# Patient Record
Sex: Female | Born: 1935 | Race: Black or African American | Hispanic: No | Smoking: Never smoker
Health system: Southern US, Community
[De-identification: ages and names within clinical notes are randomized; demographics above are authoritative.]

## PROBLEM LIST (undated history)

## (undated) DIAGNOSIS — I10 Essential (primary) hypertension: Secondary | ICD-10-CM

## (undated) DIAGNOSIS — N2581 Secondary hyperparathyroidism of renal origin: Secondary | ICD-10-CM

## (undated) DIAGNOSIS — D649 Anemia, unspecified: Secondary | ICD-10-CM

## (undated) DIAGNOSIS — K219 Gastro-esophageal reflux disease without esophagitis: Secondary | ICD-10-CM

## (undated) DIAGNOSIS — E785 Hyperlipidemia, unspecified: Secondary | ICD-10-CM

## (undated) DIAGNOSIS — S88911A Complete traumatic amputation of right lower leg, level unspecified, initial encounter: Secondary | ICD-10-CM

## (undated) DIAGNOSIS — N186 End stage renal disease: Secondary | ICD-10-CM

## (undated) DIAGNOSIS — R55 Syncope and collapse: Secondary | ICD-10-CM

## (undated) DIAGNOSIS — S88912A Complete traumatic amputation of left lower leg, level unspecified, initial encounter: Secondary | ICD-10-CM

## (undated) DIAGNOSIS — I739 Peripheral vascular disease, unspecified: Secondary | ICD-10-CM

---

## 2004-08-16 ENCOUNTER — Ambulatory Visit: Payer: Self-pay | Admitting: Family Medicine

## 2004-10-05 ENCOUNTER — Ambulatory Visit: Payer: Self-pay | Admitting: Family Medicine

## 2004-11-03 ENCOUNTER — Ambulatory Visit: Payer: Self-pay | Admitting: Family Medicine

## 2004-12-22 ENCOUNTER — Ambulatory Visit: Payer: Self-pay | Admitting: Family Medicine

## 2005-02-14 ENCOUNTER — Ambulatory Visit: Payer: Self-pay | Admitting: Family Medicine

## 2005-05-19 ENCOUNTER — Ambulatory Visit: Payer: Self-pay | Admitting: Family Medicine

## 2005-06-29 ENCOUNTER — Ambulatory Visit: Payer: Self-pay | Admitting: Family Medicine

## 2005-08-31 ENCOUNTER — Ambulatory Visit: Payer: Self-pay | Admitting: Family Medicine

## 2005-11-04 ENCOUNTER — Ambulatory Visit: Payer: Self-pay | Admitting: Family Medicine

## 2005-12-28 ENCOUNTER — Ambulatory Visit: Payer: Self-pay | Admitting: Family Medicine

## 2006-03-01 ENCOUNTER — Ambulatory Visit: Payer: Self-pay | Admitting: Family Medicine

## 2006-03-16 ENCOUNTER — Ambulatory Visit: Payer: Self-pay | Admitting: Family Medicine

## 2006-06-27 ENCOUNTER — Ambulatory Visit: Payer: Self-pay | Admitting: Family Medicine

## 2006-08-29 ENCOUNTER — Ambulatory Visit: Payer: Self-pay | Admitting: Family Medicine

## 2006-12-13 ENCOUNTER — Ambulatory Visit: Payer: Self-pay | Admitting: Family Medicine

## 2007-01-09 ENCOUNTER — Ambulatory Visit: Payer: Self-pay | Admitting: Vascular Surgery

## 2007-01-11 ENCOUNTER — Ambulatory Visit: Payer: Self-pay | Admitting: Vascular Surgery

## 2007-01-11 ENCOUNTER — Ambulatory Visit (HOSPITAL_COMMUNITY): Admission: RE | Admit: 2007-01-11 | Discharge: 2007-01-11 | Payer: Self-pay | Admitting: Vascular Surgery

## 2010-04-06 ENCOUNTER — Ambulatory Visit: Payer: Self-pay | Admitting: Vascular Surgery

## 2010-04-12 ENCOUNTER — Ambulatory Visit: Payer: Self-pay | Admitting: Vascular Surgery

## 2010-04-12 ENCOUNTER — Ambulatory Visit (HOSPITAL_COMMUNITY)
Admission: RE | Admit: 2010-04-12 | Discharge: 2010-04-12 | Payer: Self-pay | Source: Home / Self Care | Admitting: Vascular Surgery

## 2010-04-20 ENCOUNTER — Ambulatory Visit: Payer: Self-pay | Admitting: Vascular Surgery

## 2010-05-07 ENCOUNTER — Ambulatory Visit: Payer: Self-pay | Admitting: Vascular Surgery

## 2010-05-20 ENCOUNTER — Ambulatory Visit: Payer: Self-pay | Admitting: Cardiology

## 2010-05-28 ENCOUNTER — Ambulatory Visit: Payer: Self-pay | Admitting: Vascular Surgery

## 2010-07-05 ENCOUNTER — Ambulatory Visit (HOSPITAL_COMMUNITY)
Admission: RE | Admit: 2010-07-05 | Discharge: 2010-07-05 | Payer: Self-pay | Source: Home / Self Care | Attending: Nephrology | Admitting: Nephrology

## 2010-08-06 ENCOUNTER — Ambulatory Visit (HOSPITAL_COMMUNITY)
Admission: RE | Admit: 2010-08-06 | Discharge: 2010-08-06 | Payer: Self-pay | Source: Home / Self Care | Attending: Nephrology | Admitting: Nephrology

## 2010-08-09 LAB — POTASSIUM: Potassium: 4.2 mEq/L (ref 3.5–5.1)

## 2010-09-30 LAB — GLUCOSE, CAPILLARY
Glucose-Capillary: 83 mg/dL (ref 70–99)
Glucose-Capillary: 88 mg/dL (ref 70–99)
Glucose-Capillary: 97 mg/dL (ref 70–99)

## 2010-09-30 LAB — POCT I-STAT 4, (NA,K, GLUC, HGB,HCT)
Glucose, Bld: 86 mg/dL (ref 70–99)
Hemoglobin: 12.2 g/dL (ref 12.0–15.0)

## 2010-09-30 LAB — SURGICAL PCR SCREEN: Staphylococcus aureus: NEGATIVE

## 2010-11-30 NOTE — Consult Note (Signed)
VASCULAR SURGERY CONSULTATION   Linda [name], Linda [name]  DOB:  09/22/1930                                       01/09/2007  CHART#:[id number]   HISTORY:  Linda [name] is a 77 year old female patient, referred by Dr.  Kathaleen Maser. Fleishman because of gangrene of the left fifth toe.  She had a  history of a right below-the-knee amputation performed in [place],  [place] in 2001, secondary to gangrene of the right foot.  She  apparently had an angiogram which did not reveal any reconstructable  situation in the right leg prior to that.  At some point in the last few  months she developed a gangrenous ulcer on her left fifth toe which has  been treated with antibiotics and local wound care, with no improvement.  She had arterial Dopplers performed which revealed excellent flow down  through the popliteal level and actually biphasic wave forms in the left  posterior tibial and dorsalis pedis arteries, but toe brachial index of  only 0.23, suggesting severe disease in the foot.  She was referred to  see if revascularization could improve the chance of feeling, since she  has had one amputation.  She ambulates with a walker with her right  below-the-knee amputation prosthesis.   PAST MEDICAL HISTORY:  1. Non-insulin-dependent diabetes mellitus.  2. Hypertension.  3. Hyperlipidemia.  4. Negative coronary artery disease, chronic obstructive pulmonary      disease or stroke.   PAST SURGICAL HISTORY:  1. A right below-the-knee amputation.  2. Hysterectomy.  3. Bilateral cataract surgeries.   FAMILY HISTORY:  Negative for stroke and coronary artery disease.  Positive for diabetes in her father, she thinks.   SOCIAL HISTORY:  She is single.  She has four children.  She is retired.  She does not use tobacco or alcohol.   REVIEW OF SYSTEMS/MEDICATIONS:  Please see the health and history form.   ALLERGIES:  PERCOCET.   PHYSICAL EXAMINATION:  VITAL SIGNS:  Blood  pressure 160/80, heart rate  92, respirations 18.  GENERAL:  She is an elderly female, in no apparent distress, alert and  oriented x3.  NECK:  Supple with 3+ carotid pulses palpable.  No bruits are audible.  There is no palpable adenopathy in the neck.  NEUROLOGIC:  Exam is normal, with the exception of decreased sensation  in  the left foot.  CHEST:  Clear to auscultation.  CARDIOVASCULAR:  A regular rhythm with no murmurs.  ABDOMEN:  Soft, nontender, with no palpable masses.  EXTREMITIES:  Right leg has a below-the-knee amputation prosthesis in  place with 3+ femoral pulse palpable.  Left leg has a 3+ femoral and  popliteal pulse palpable.  Dorsalis pedis and posterior tibial pulses  are not palpable on the left.  She has gangrene of the left fifth toe  with no cellulitis or purulent drainage.  The other toes appear healthy.   I suspect that she has severe  distal tibial disease and cannot be  helped by revascularization, but we will perform a left femoral  angiogram on Thursday, January 01, 2007, to see what is available for  possible revascularization.  I suspect she will eventually require a  left below-the-knee amputation.   Quita Skye Hart Rochester, M.D.  Electronically Signed  JDL/MEDQ  D:  01/09/2007  T:  01/10/2007  Job:  [id number]  cc:   Sherilyn Cooter A. Cleotis Nipper, M.D.  Denny Peon. Ulice Brilliant, D.P.M.  Delaney Meigs, M.D.

## 2010-11-30 NOTE — Procedures (Signed)
CEPHALIC VEIN MAPPING   INDICATION:  End-stage renal disease, evaluate for dialysis placement.   HISTORY:   EXAM:  The right cephalic vein is not compressible.   The right basilic vein is compressible.   The left cephalic vein is compressible.   The left basilic vein is compressible.   See attached worksheet for all measurements.   IMPRESSION:  1. Patent left cephalic vein and patent right and left basilic veins.  2. Right cephalic vein shows a segment of occlusive chronic thrombus      at the distal upper arm and antecubital fossa.   ___________________________________________  Quita Skye. Hart Rochester, M.D.   EM/MEDQ  D:  04/06/2010  T:  04/06/2010  Job:  [id number] [id number]

## 2010-11-30 NOTE — Assessment & Plan Note (Signed)
OFFICE VISIT   Linda [name], Linda [name]  DOB:  1931-05-26                                       04/20/2010  CHART#:[id number]   The patient presents today for evaluation of recently placed right upper  arm AV Gore-Tex graft and left IJ Diatek catheter by Dr. Imogene Burn on  04/12/2010.  The nursing facility called saying she was having increased  pain and redness in the graft site.   On physical exam she has an excellent thrill in the graft.  She has the  usual amount of erythema one week following placement of a new graft.  She has diminished pulses in both hands.  She does report some coolness  in her right hand.  She is left-handed.  She denies any significant pain  in her right hand but does have pain in her right upper arm over the  insertion site.  The incision sites themselves are healing quite nicely  and there is no evidence of infection.  She was reassured and will see  Dr. Imogene Burn for continued follow-up to rule out a significant steal in 2  weeks.     Larina Earthly, M.D.  Electronically Signed   TFE/MEDQ  D:  04/20/2010  T:  04/21/2010  Job:  [id number]

## 2010-11-30 NOTE — Op Note (Signed)
NAME:  Linda [name], Linda [name] NO.:  [id number]   MEDICAL RECORD NO.:  [id number]          PATIENT TYPE:  AMB   LOCATION:  SDS                          FACILITY:  [facility]   PHYSICIAN:  Quita Skye. Hart Rochester, M.D.  DATE OF BIRTH:  July 10, 1931   DATE OF PROCEDURE:  01/11/2007  DATE OF DISCHARGE:                               OPERATIVE REPORT   PREOPERATIVE DIAGNOSIS:  Gangrenous left fifth toe secondary to severe  tibial occlusive disease.   PROCEDURE:  Left femoral angiogram.   SURGEON:  Dr. Hart Rochester   ANESTHESIA:  Local Xylocaine.   CONTRAST:  60 mL.   COMPLICATIONS:  None.   DESCRIPTION OF PROCEDURE:  The patient was taken to [facility]  peripheral endovascular lab, placed in supine position at which time the  left groin was prepped with Betadine solution and draped in a routine  sterile manner.  After infiltration of 1% Xylocaine, left common femoral  artery was entered percutaneously.  Guidewire passed into the external  iliac artery under fluoroscopic guidance.  A 5-French sheath and dilator  were passed over the guide wire, dilator removed and a left lower  extremity angiogram was performed through the sheath injecting 40 mL of  contrast at 30 mL per second.  This revealed the left external iliac,  common femoral, profunda femoral, superficial femoral and popliteal  arteries to be relatively normal and widely patent.  There was severe  tibial disease below the knee with anterior tibial being totally  occluded, posterior tibial being totally occluded, peroneal being patent  but severely diseased.  It was patent down to the lower third of the leg  where it became occluded.  The only reconstituted vessel in the foot was  a dorsalis pedis artery just below the flexion crease, but there was  very poor filling of any palmar pedal arch, and this segment of vessel  appeared to be diseased.  Additional views of the foot were obtained  using the peak hold technique.  Sheath was  then removed.  Adequate  compression applied.  No complications ensued.   FINDINGS:  Widely patent external iliac, superficial femoral, common  femoral, profunda femoris and popliteal arteries on the left with severe  tibial occlusive disease with the only patent vessel being the peroneal  artery which was severely diseased and reconstitution of a diseased  dorsalis pedis artery in the foot.      Quita Skye Hart Rochester, M.D.  Electronically Signed    JDL/MEDQ  D:  01/11/2007  T:  01/11/2007  Job:  [id number]   cc:   Sherilyn Cooter A. Cleotis Nipper, M.D.

## 2010-11-30 NOTE — Consult Note (Signed)
NEW PATIENT CONSULTATION   Linda [name], Linda [name]  DOB:  [birth year]                                       04/06/2010  CHART#:[id number]   The patient is a 77 year old female with chronic renal insufficiency who  has never had hemodialysis, but was referred by Dr. Kristian Covey for  vascular access.  She is left-handed.  Her renal insufficiency is due to  insulin dependent diabetes mellitus.   PAST MEDICAL HISTORY:  Chronic medical problems:  1. Hypertension.  2. Hyperlipidemia.  3. Diabetes mellitus insulin dependent.  4. End-stage renal disease.  5. GERD.  6. Depression.  7. Peripheral vascular disease status post bilateral below-knee      amputation 10-20 years ago.  8. Negative for coronary artery disease, COPD or stroke.   SOCIAL HISTORY:  She is single, has three children and does not use  tobacco.  Does drink occasional alcohol.   FAMILY HISTORY:  Negative for coronary artery disease, diabetes and  stroke.   REVIEW OF SYSTEMS:  Positive for arthritis, tremor, chronic renal  insufficiency and occasional esophagitis.  All other systems are  negative in review of systems.   PHYSICAL EXAMINATION:  His blood pressure 207/79, heart rate 89,  respirations 14.  General:  She is chronically ill-appearing female who  is in no distress.  She is alert and oriented x3.  HEENT:  Exam normal  for age.  EOMs intact.  Lungs:  Clear to auscultation.  No rhonchi or  wheezing.  Cardiovascular:  Regular rhythm.  No murmurs.  Carotid pulses  3+, no bruits.  Abdomen is obese.  No palpable masses.  Musculoskeletal:  Exam is free of major deformities.  She does have bilateral below-knee  amputations.  Neurologic exam unremarkable with intact sensation.  Skin:  Free of rashes.  Upper extremity exam reveals 3+ brachial and radial  pulses bilaterally.  She has well-perfused upper extremities, the  surface veins appear poor on physical exam.   Today I ordered a bilateral vein  mapping which I have reviewed and  interpreted.  She has cephalic veins which are totally inadequate in  both upper extremities.  The right basilic vein also appears poor.  The  left basilic vein is slightly larger in the mid to upper arm, but not  imaged in the antecubital area.   I think this patient will need an AV graft in her nondominant arm  (right).  Dr. Kristian Covey would also like a Diatek catheter.  I have  scheduled that for Dr. Imogene Burn on Monday, September 26 to have a Diatek  catheter in the graft inserted in the right upper extremity.  Risks and  benefits have been thoroughly discussed with the patient and she would  like to proceed.     Quita Skye Hart Rochester, M.D.  Electronically Signed   JDL/MEDQ  D:  04/06/2010  T:  04/07/2010  Job:  [id number]   cc:   Delaney Meigs, M.D.  Jorja Loa, M.D.

## 2010-11-30 NOTE — Assessment & Plan Note (Signed)
OFFICE VISIT   TASHICA, PROVENCIO  DOB:  15-Oct-1930                                       05/07/2010  CHART#:[id number]   This is a postoperative visit.   HISTORY OF PRESENT ILLNESS:  This is a 77 year old female who underwent,  on the April 12, 2010, a left internal jugular tunneled dialysis  catheter and then placement of a right brachial axillary arteriovenous  graft.  She previously, since her operation, has been seen in clinic by  Dr. Arbie Cookey on October 4.  At that point she was healing her incisions  well, with coolness in the right hand and some pain at the insertion  site.  He felt that at this point she was progressing appropriately.  Today she complains of coolness in the hand, some tingling in the  incision site, and unwillingness to move her upper extremity due to this  discomfort and  some numbness in her finger tips.  All of her symptoms  have been improving consistently since the operation.  At this point she  had does not note the pain is significant enough to wake her from sleep,  and is no longer taking pain medications.   Past medical history, past surgical history, social history, family  history, review of systems, notification and allergies are all the same  as her previous clinic visit from April 06, 2010.   PHYSICAL EXAMINATION:  Vital signs:  Temperature 97.9, blood pressure  158/80, heart rate of 74, respirations were 12.  On focused exam of her  right arm, she is healing her right upper arm incision nicely.  The  Dermabond is slowly peeling away.  Incisions are intact.  Axillary  incision is also healing well.  No signs of erythema throughout this  graft tunnel.  Excellent thrill and bruit throughout this graft.  There  is a weakly palpable radial pulse which I verified with a Doppler which  has a biphasic radial signal.  She has intact pinpoint discrimination in  the palm and the fingertips.  The hand bilaterally is a  little bit cool.  She has intact hand grip 4/5 strength and does have intrinsic and  extrinsic muscle strength intact, somewhat extenuated at 3/5 strength.  When I ask her to fully flex the upper extremities, she is able to  produce motor to fully extend and flex her upper extremity, but she  notes it does cause her some discomfort.   MEDICAL DECISION MAKING:  This is a patient now status post a right  upper arm arteriovenous graft placement that has some evidence of  physiologic steal; however, she has biphasic signals in her radial  artery with intact motor and sensation.  I see no advantage to ligating  this access.  Additionally, her symptomatology is improving with time so  once again I recommended strongly to her that we do not ligate this and  we just watch it with time.  She is now about 4 weeks out from the  access so another 2 weeks we will see her back in clinic and probably at  that point clear her for starting cannulation of this graft.  She is  going to keep an eye on her hand symptoms and we will see her at that  point.     Leonides Sake, MD  Electronically Signed   BC/MEDQ  D:  05/07/2010  T:  05/07/2010  Job:  [id number]

## 2010-11-30 NOTE — Assessment & Plan Note (Signed)
OFFICE VISIT   YARENIS, CERINO  DOB:  09-26-1930                                       05/28/2010  CHART#:[id number]   HISTORY OF PRESENT ILLNESS:  This is a 77 year old female that underwent  a left internal jugular tunneled dialysis catheter and a right brachial  axillary AV graft on April 12, 2010.  I saw her again on October 21  and also had been seen by Dr. Arbie Cookey October 4.  She had some steal  symptoms during both visits but this has been improving.  At this point,  her symptoms are mostly resolved and she is able to use her right hand  and the numbness is greatly improved.   PHYSICAL EXAMINATION:  Today she a temperature of 97.8 blood pressure  160/78, heart rate 74, respirations were 12.  On examination, she had  hand grip in the right arm that was 4/5 and sensation intact in all  fingers in this hand.  She did not have a palpable radial pulse, so I  was able to Doppler it.  There was a biphasic signal here.  The hand is  a little cool.  There is a strong thrill and bruit in her graft.   MEDICAL DECISION MAKING:  At this point, she is greater than 6 weeks out  from surgery so I would at this point clear her for graft use.  This is  a 77 year old female status post right arm arteriovenous graft placed in  a brachioaxillary configuration.  At this point, she is ready to start  cannulation of this graft.  Her steal symptoms have improved since  placement of this graft.  I have cleared her for use of the graft and  after 2 successful cannulations, they will remove the left internal  jugular TDC.  She can follow up with me as needed.     Leonides Sake, MD  Electronically Signed   BC/MEDQ  D:  05/28/2010  T:  05/31/2010  Job:  [id number]

## 2011-03-28 ENCOUNTER — Other Ambulatory Visit (HOSPITAL_COMMUNITY): Payer: Self-pay | Admitting: Nephrology

## 2011-03-28 DIAGNOSIS — N186 End stage renal disease: Secondary | ICD-10-CM

## 2011-03-28 DIAGNOSIS — Z992 Dependence on renal dialysis: Secondary | ICD-10-CM

## 2011-03-29 ENCOUNTER — Other Ambulatory Visit (HOSPITAL_COMMUNITY): Payer: Self-pay | Admitting: Nephrology

## 2011-03-29 DIAGNOSIS — Z992 Dependence on renal dialysis: Secondary | ICD-10-CM

## 2011-03-29 DIAGNOSIS — N186 End stage renal disease: Secondary | ICD-10-CM

## 2011-03-30 ENCOUNTER — Other Ambulatory Visit (HOSPITAL_COMMUNITY): Payer: Self-pay

## 2011-03-30 ENCOUNTER — Ambulatory Visit (HOSPITAL_COMMUNITY)
Admission: RE | Admit: 2011-03-30 | Discharge: 2011-03-30 | Disposition: A | Discharge status: Home / Self Care | Payer: Medicare Other | Source: Ambulatory Visit | Attending: Nephrology | Admitting: Nephrology

## 2011-03-30 DIAGNOSIS — T82898A Other specified complication of vascular prosthetic devices, implants and grafts, initial encounter: Secondary | ICD-10-CM | POA: Insufficient documentation

## 2011-03-30 DIAGNOSIS — N186 End stage renal disease: Secondary | ICD-10-CM

## 2011-03-30 DIAGNOSIS — Y832 Surgical operation with anastomosis, bypass or graft as the cause of abnormal reaction of the patient, or of later complication, without mention of misadventure at the time of the procedure: Secondary | ICD-10-CM | POA: Insufficient documentation

## 2011-03-30 MED ORDER — IOHEXOL 300 MG/ML  SOLN
100.0000 mL | Freq: Once | INTRAMUSCULAR | Status: AC | PRN
  Administered 2011-03-30: 27 mL via INTRAVENOUS

## 2011-04-29 ENCOUNTER — Other Ambulatory Visit (HOSPITAL_COMMUNITY): Payer: Self-pay | Admitting: Nephrology

## 2011-04-29 DIAGNOSIS — N186 End stage renal disease: Secondary | ICD-10-CM

## 2011-05-04 LAB — PROTIME-INR
INR: 1
Prothrombin Time: 12.9

## 2011-05-04 LAB — COMPREHENSIVE METABOLIC PANEL
Alkaline Phosphatase: 102
BUN: 27 — ABNORMAL HIGH
Calcium: 9.1
Chloride: 106
Glucose, Bld: 250 — ABNORMAL HIGH
Potassium: 4.7
Total Bilirubin: 0.4

## 2011-05-04 LAB — CBC: RBC: 4.14

## 2011-05-04 LAB — APTT: aPTT: 30

## 2011-05-17 ENCOUNTER — Ambulatory Visit (HOSPITAL_COMMUNITY): Admission: RE | Admit: 2011-05-17 | Payer: Medicare Other | Source: Ambulatory Visit

## 2011-05-17 ENCOUNTER — Inpatient Hospital Stay (HOSPITAL_COMMUNITY): Admission: RE | Admit: 2011-05-17 | Payer: Medicare Other | Source: Ambulatory Visit

## 2011-05-19 ENCOUNTER — Ambulatory Visit (HOSPITAL_COMMUNITY)
Admission: RE | Admit: 2011-05-19 | Discharge: 2011-05-19 | Disposition: A | Discharge status: Home / Self Care | Payer: Medicare Other | Source: Ambulatory Visit | Attending: Nephrology | Admitting: Nephrology

## 2011-05-19 DIAGNOSIS — Y832 Surgical operation with anastomosis, bypass or graft as the cause of abnormal reaction of the patient, or of later complication, without mention of misadventure at the time of the procedure: Secondary | ICD-10-CM | POA: Insufficient documentation

## 2011-05-19 DIAGNOSIS — N186 End stage renal disease: Secondary | ICD-10-CM | POA: Insufficient documentation

## 2011-05-19 DIAGNOSIS — T82898A Other specified complication of vascular prosthetic devices, implants and grafts, initial encounter: Secondary | ICD-10-CM | POA: Insufficient documentation

## 2011-05-19 MED ORDER — IOHEXOL 300 MG/ML  SOLN
27.0000 mL | Freq: Once | INTRAMUSCULAR | Status: AC | PRN
  Administered 2011-05-19: 27 mL via INTRAVENOUS

## 2011-08-01 ENCOUNTER — Encounter (HOSPITAL_COMMUNITY): Payer: Self-pay | Admitting: Pharmacy Technician

## 2011-08-01 ENCOUNTER — Other Ambulatory Visit (HOSPITAL_COMMUNITY): Payer: Self-pay | Admitting: Nephrology

## 2011-08-01 DIAGNOSIS — N186 End stage renal disease: Secondary | ICD-10-CM

## 2011-08-02 ENCOUNTER — Ambulatory Visit (HOSPITAL_COMMUNITY)
Admission: RE | Admit: 2011-08-02 | Discharge: 2011-08-02 | Disposition: A | Discharge status: Home / Self Care | Payer: Medicare Other | Source: Ambulatory Visit | Attending: Nephrology | Admitting: Nephrology

## 2011-08-02 ENCOUNTER — Encounter (HOSPITAL_COMMUNITY): Payer: Self-pay

## 2011-08-02 ENCOUNTER — Other Ambulatory Visit (HOSPITAL_COMMUNITY): Payer: Self-pay | Admitting: Nephrology

## 2011-08-02 VITALS — BP 121/63 | HR 68 | Temp 97.8°F | Resp 15

## 2011-08-02 DIAGNOSIS — E785 Hyperlipidemia, unspecified: Secondary | ICD-10-CM | POA: Insufficient documentation

## 2011-08-02 DIAGNOSIS — S88919A Complete traumatic amputation of unspecified lower leg, level unspecified, initial encounter: Secondary | ICD-10-CM | POA: Insufficient documentation

## 2011-08-02 DIAGNOSIS — N186 End stage renal disease: Secondary | ICD-10-CM

## 2011-08-02 DIAGNOSIS — E119 Type 2 diabetes mellitus without complications: Secondary | ICD-10-CM | POA: Insufficient documentation

## 2011-08-02 DIAGNOSIS — I120 Hypertensive chronic kidney disease with stage 5 chronic kidney disease or end stage renal disease: Secondary | ICD-10-CM | POA: Insufficient documentation

## 2011-08-02 DIAGNOSIS — Y832 Surgical operation with anastomosis, bypass or graft as the cause of abnormal reaction of the patient, or of later complication, without mention of misadventure at the time of the procedure: Secondary | ICD-10-CM | POA: Insufficient documentation

## 2011-08-02 DIAGNOSIS — T82898A Other specified complication of vascular prosthetic devices, implants and grafts, initial encounter: Secondary | ICD-10-CM | POA: Insufficient documentation

## 2011-08-02 DIAGNOSIS — D649 Anemia, unspecified: Secondary | ICD-10-CM | POA: Insufficient documentation

## 2011-08-02 HISTORY — DX: End stage renal disease: N18.6

## 2011-08-02 HISTORY — DX: Hyperlipidemia, unspecified: E78.5

## 2011-08-02 HISTORY — DX: Anemia, unspecified: D64.9

## 2011-08-02 HISTORY — DX: Essential (primary) hypertension: I10

## 2011-08-02 HISTORY — DX: Complete traumatic amputation of left lower leg, level unspecified, initial encounter: S88.912A

## 2011-08-02 HISTORY — DX: Complete traumatic amputation of right lower leg, level unspecified, initial encounter: S88.911A

## 2011-08-02 HISTORY — DX: Syncope and collapse: R55

## 2011-08-02 LAB — POTASSIUM: Potassium: 4.9 mEq/L (ref 3.5–5.1)

## 2011-08-02 MED ORDER — MIDAZOLAM HCL 5 MG/5ML IJ SOLN
INTRAMUSCULAR | Status: AC | PRN
  Administered 2011-08-02: 1 mg via INTRAVENOUS

## 2011-08-02 MED ORDER — IOHEXOL 300 MG/ML  SOLN
100.0000 mL | Freq: Once | INTRAMUSCULAR | Status: AC | PRN
  Administered 2011-08-02: 50 mL via INTRAVENOUS

## 2011-08-02 MED ORDER — MIDAZOLAM HCL 2 MG/2ML IJ SOLN
INTRAMUSCULAR | Status: AC
  Filled 2011-08-02: qty 4

## 2011-08-02 MED ORDER — FENTANYL CITRATE 0.05 MG/ML IJ SOLN
INTRAMUSCULAR | Status: AC | PRN
  Administered 2011-08-02: 50 ug via INTRAVENOUS

## 2011-08-02 MED ORDER — FENTANYL CITRATE 0.05 MG/ML IJ SOLN
INTRAMUSCULAR | Status: AC
  Filled 2011-08-02: qty 4

## 2011-08-02 MED ORDER — ALTEPLASE 100 MG IV SOLR
2.0000 mg | INTRAVENOUS | Status: DC
  Filled 2011-08-02: qty 2

## 2011-08-02 MED ORDER — HEPARIN SODIUM (PORCINE) 1000 UNIT/ML IJ SOLN
INTRAMUSCULAR | Status: AC
  Administered 2011-08-02: 3000 [IU]
  Filled 2011-08-02: qty 1

## 2011-08-02 NOTE — H&P (Signed)
Linda [name] is an 78 y.o. female.   Chief Complaint: clotted dialysis graft HPI: Patient with ESRD and clotted right arm AVGG presents today for thrombolysis, possible angioplasty/stenting of AVGG or placement of new catheter if necessary.  Past Medical History  Diagnosis Date  . Syncope   . End stage kidney disease   . Hyperlipidemia   . Anemia   . Hypertension   . 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 Diabetes mellitus    PSH: vascular access procedures, bilat LE amputations  No family history on file. Social History:  does not have a smoking history on file. She does not have any smokeless tobacco history on file. Her alcohol and drug histories not on file.  Allergies:  Allergies  Allergen Reactions  . Percocet (Oxycodone-Acetaminophen)     Medications Prior to Admission  Medication Sig Dispense Refill  . Amino Acids-Protein Hydrolys (PRO-STAT) LIQD Take 30 mLs by mouth 3 (three) times daily. Wild cherry      . aspirin EC 81 MG tablet Take 81 mg by mouth daily.      . cloNIDine (CATAPRES) 0.2 MG tablet Take 0.2 mg by mouth 2 (two) times daily.      . Cranberry 400 MG TABS Take 2 tablets by mouth 2 (two) times daily.      Marland Kitchen diltiazem (CARDIZEM CD) 300 MG 24 hr capsule Take 300 mg by mouth daily.      . enalapril (VASOTEC) 10 MG tablet Take 10 mg by mouth daily.      Marland Kitchen Fe-Succ-C-Thre-B12-Des Stomach (CHROMAGEN PO) Take 1 capsule by mouth daily.      . insulin detemir (LEVEMIR) 100 UNIT/ML injection Inject 10 Units into the skin at bedtime.      . insulin lispro (HUMALOG) 100 UNIT/ML injection Inject 5 Units into the skin 2 (two) times daily before lunch and supper.      . potassium chloride (K-DUR,KLOR-CON) 10 MEQ tablet Take 40 mEq by mouth daily.      . ranitidine (ZANTAC) 75 MG tablet Take 75 mg by mouth 2 (two) times daily.      . simvastatin (ZOCOR) 10 MG tablet Take 10 mg by mouth at bedtime.       Medications Prior to  Admission  Medication Dose Route Frequency Provider Last Rate Last Dose  . alteplase (ACTIVASE) injection 2 mg  2 mg Intracatheter to XRAY Abundio Miu, MD        Results for orders placed during the hospital encounter of 08/02/11 (from the past 48 hour(s))  GLUCOSE, CAPILLARY     Status: Abnormal   Collection Time   08/02/11  8:12 AM      Component Value Range Comment   Glucose-Capillary 102 (*) 70 - 99 (mg/dL)    Results for orders placed during the hospital encounter of 08/02/11  GLUCOSE, CAPILLARY      Component Value Range   Glucose-Capillary 102 (*) 70 - 99 (mg/dL)    Review of Systems  Constitutional: Negative for fever and chills.  Respiratory: Negative for cough and shortness of breath.   Cardiovascular: Negative for chest pain.  Gastrointestinal: Negative for nausea, vomiting and abdominal pain.  Neurological: Negative for headaches.  Endo/Heme/Allergies: Does not bruise/bleed easily.   Vitals: BP 196/60  HR 69  TEMP 97.8  O2 SATS 99% RA Physical Exam  Constitutional: She appears well-developed and well-nourished.  Cardiovascular: Normal rate and regular rhythm.  Right arm AVGG with no thrill/bruit  Respiratory: Effort normal and breath sounds normal.  GI: Soft. Bowel sounds are normal.     Assessment/Plan Patient with ESRD and clotted right arm AVGG; plan is for thrombolysis, possible angioplasty/stenting of graft with placement of new dialysis catheter if necessary.  Clydell Alberts,D KEVIN 08/02/2011, 9:02 AM

## 2011-08-02 NOTE — Procedures (Signed)
Successful declot of right upper graft.  No immediate complication.

## 2011-08-03 NOTE — H&P (Signed)
Agree with PA note. 

## 2011-08-15 ENCOUNTER — Encounter (HOSPITAL_COMMUNITY): Payer: Self-pay | Admitting: Emergency Medicine

## 2011-08-15 ENCOUNTER — Emergency Department (HOSPITAL_COMMUNITY): Payer: Medicare Other

## 2011-08-15 ENCOUNTER — Other Ambulatory Visit: Payer: Self-pay

## 2011-08-15 ENCOUNTER — Emergency Department (HOSPITAL_COMMUNITY)
Admission: EM | Admit: 2011-08-15 | Discharge: 2011-08-16 | Disposition: A | Discharge status: Home / Self Care | Payer: Medicare Other | Attending: Emergency Medicine | Admitting: Emergency Medicine

## 2011-08-15 DIAGNOSIS — Z794 Long term (current) use of insulin: Secondary | ICD-10-CM | POA: Insufficient documentation

## 2011-08-15 DIAGNOSIS — R4182 Altered mental status, unspecified: Secondary | ICD-10-CM | POA: Insufficient documentation

## 2011-08-15 DIAGNOSIS — R05 Cough: Secondary | ICD-10-CM | POA: Insufficient documentation

## 2011-08-15 DIAGNOSIS — I120 Hypertensive chronic kidney disease with stage 5 chronic kidney disease or end stage renal disease: Secondary | ICD-10-CM | POA: Insufficient documentation

## 2011-08-15 DIAGNOSIS — R0602 Shortness of breath: Secondary | ICD-10-CM | POA: Insufficient documentation

## 2011-08-15 DIAGNOSIS — Z992 Dependence on renal dialysis: Secondary | ICD-10-CM | POA: Insufficient documentation

## 2011-08-15 DIAGNOSIS — N390 Urinary tract infection, site not specified: Secondary | ICD-10-CM | POA: Insufficient documentation

## 2011-08-15 DIAGNOSIS — R059 Cough, unspecified: Secondary | ICD-10-CM | POA: Insufficient documentation

## 2011-08-15 DIAGNOSIS — E119 Type 2 diabetes mellitus without complications: Secondary | ICD-10-CM | POA: Insufficient documentation

## 2011-08-15 DIAGNOSIS — N186 End stage renal disease: Secondary | ICD-10-CM | POA: Insufficient documentation

## 2011-08-15 DIAGNOSIS — S88119A Complete traumatic amputation at level between knee and ankle, unspecified lower leg, initial encounter: Secondary | ICD-10-CM | POA: Insufficient documentation

## 2011-08-15 LAB — COMPREHENSIVE METABOLIC PANEL
ALT: 7 U/L (ref 0–35)
AST: 12 U/L (ref 0–37)
Alkaline Phosphatase: 106 U/L (ref 39–117)
CO2: 34 mEq/L — ABNORMAL HIGH (ref 19–32)
Chloride: 97 mEq/L (ref 96–112)
Creatinine, Ser: 3.86 mg/dL — ABNORMAL HIGH (ref 0.50–1.10)
GFR calc non Af Amer: 10 mL/min — ABNORMAL LOW (ref >90)
Total Bilirubin: 0.2 mg/dL — ABNORMAL LOW (ref 0.3–1.2)

## 2011-08-15 LAB — URINE MICROSCOPIC-ADD ON

## 2011-08-15 LAB — URINALYSIS, ROUTINE W REFLEX MICROSCOPIC
Bilirubin Urine: NEGATIVE
Ketones, ur: NEGATIVE mg/dL
Protein, ur: >300 mg/dL — AB
Urobilinogen, UA: 0.2 mg/dL (ref 0.0–1.0)

## 2011-08-15 LAB — DIFFERENTIAL
Basophils Absolute: 0 10*3/uL (ref 0.0–0.1)
Lymphocytes Relative: 35 % (ref 12–46)
Monocytes Absolute: 0.3 10*3/uL (ref 0.1–1.0)
Neutro Abs: 3.4 10*3/uL (ref 1.7–7.7)

## 2011-08-15 LAB — CBC
HCT: 31.4 % — ABNORMAL LOW (ref 36.0–46.0)
Hemoglobin: 9.7 g/dL — ABNORMAL LOW (ref 12.0–15.0)
RDW: 14.7 % (ref 11.5–15.5)
WBC: 5.9 10*3/uL (ref 4.0–10.5)

## 2011-08-15 LAB — CARDIAC PANEL(CRET KIN+CKTOT+MB+TROPI)
Relative Index: INVALID (ref 0.0–2.5)
Troponin I: <0.3 ng/mL (ref <0.30)

## 2011-08-15 MED ORDER — CEFTRIAXONE SODIUM 1 G IJ SOLR
INTRAMUSCULAR | Status: AC
  Administered 2011-08-15: 1 g via INTRAMUSCULAR
  Filled 2011-08-15: qty 10

## 2011-08-15 MED ORDER — LIDOCAINE HCL 1 % IJ SOLN
INTRAMUSCULAR | Status: AC
  Filled 2011-08-15: qty 20

## 2011-08-15 MED ORDER — DEXTROSE 5 % IV SOLN: 1.0000 g | Freq: Once | INTRAVENOUS | Status: DC

## 2011-08-15 MED ORDER — CEPHALEXIN 500 MG PO CAPS: 500.0000 mg | ORAL_CAPSULE | Freq: Four times a day (QID) | ORAL | Status: AC

## 2011-08-15 MED ORDER — CEFTRIAXONE SODIUM 1 G IJ SOLR
1.0000 g | Freq: Once | INTRAMUSCULAR | Status: AC
  Administered 2011-08-15: 1 g via INTRAMUSCULAR

## 2011-08-15 NOTE — ED Notes (Signed)
ZOX:WR60<AV> Expected date:08/15/11<BR> Expected time: 9:12 PM<BR> Means of arrival:Ambulance<BR> Comments:<BR> EMS 6 RK - DLOC/recent UTI tx

## 2011-08-15 NOTE — ED Notes (Signed)
Pt. Returned from CT.  Refused IV line.

## 2011-08-15 NOTE — ED Provider Notes (Signed)
History     CSN: [id number]  Arrival date & time 08/15/11  2127   First MD Initiated Contact with Patient 08/15/11 2136      Chief Complaint  Patient presents with  . Altered Mental Status    (Consider location/radiation/quality/duration/timing/severity/associated sxs/prior treatment) HPI Comments: Dialysis patient sent from nursing home with reported change in mental status. Per report she has been not acting right since being started on Cipro for urinary tract infection on January 24. Patient is alert and oriented x3 and has no complaints. She was last dialyzed today. She denies any fever, chest pain abdominal pain, nausea or vomiting. He does make some urine. Report or fever. She is a good by mouth intake. She is bed bound due to bilateral BKA's.  The history is provided by the patient.    Past Medical History  Diagnosis Date  . Syncope   . End stage kidney disease   . Hyperlipidemia   . Anemia   . Hypertension   . 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 Diabetes mellitus     History reviewed. No pertinent past surgical history.  No family history on file.  History  Substance Use Topics  . Smoking status: Never Smoker   . Smokeless tobacco: Not on file  . Alcohol Use: No    OB History    Grav Para Term Preterm Abortions TAB SAB Ect Mult Living                  Review of Systems  Constitutional: Positive for activity change, appetite change and fatigue. Negative for fever.  HENT: Negative for congestion and rhinorrhea.   Eyes: Negative for visual disturbance.  Respiratory: Negative for cough, chest tightness and shortness of breath.   Cardiovascular: Negative for chest pain.  Gastrointestinal: Negative for nausea, vomiting and abdominal pain.  Genitourinary: Negative for dysuria, hematuria, vaginal bleeding and vaginal discharge.  Musculoskeletal: Negative for back pain.  Skin: Negative for rash.  Neurological: Negative  for headaches.  Psychiatric/Behavioral: Positive for altered mental status.    Allergies  Percocet  Home Medications   Current Outpatient Rx  Name Route Sig Dispense Refill  . PRO-STAT PO LIQD Oral Take 30 mLs by mouth 3 (three) times daily. Wild cherry    . ASPIRIN 81 MG PO CHEW Oral Chew 81 mg by mouth daily.    Marland Kitchen NEPHRO-VITE 0.8 MG PO TABS Oral Take 0.8 mg by mouth at bedtime.    Marland Kitchen CIPROFLOXACIN HCL 250 MG PO TABS Oral Take 250 mg by mouth 2 (two) times daily.    Marland Kitchen CLONIDINE HCL 0.2 MG PO TABS Oral Take 0.2 mg by mouth 2 (two) times daily.    Marland Kitchen CRANBERRY 400 MG PO TABS Oral Take 2 tablets by mouth 2 (two) times daily.    Marland Kitchen DILTIAZEM HCL ER COATED BEADS 300 MG PO CP24 Oral Take 300 mg by mouth daily.    . ENALAPRIL MALEATE 10 MG PO TABS Oral Take 10 mg by mouth daily.    . CHROMAGEN PO Oral Take 1 capsule by mouth daily.    . INSULIN DETEMIR 100 UNIT/ML [place] SOLN Subcutaneous Inject 10 Units into the skin at bedtime.    . INSULIN LISPRO (HUMAN) 100 UNIT/ML [place] SOLN Subcutaneous Inject 5 Units into the skin 2 (two) times daily before lunch and supper.    Marland Kitchen POTASSIUM CHLORIDE CRYS ER 10 MEQ PO TBCR Oral Take 40 mEq by mouth daily.    Marland Kitchen  RANITIDINE HCL 75 MG PO TABS Oral Take 75 mg by mouth 2 (two) times daily.    Marland Kitchen SIMVASTATIN 10 MG PO TABS Oral Take 10 mg by mouth at bedtime.    . CEPHALEXIN 500 MG PO CAPS Oral Take 1 capsule (500 mg total) by mouth 4 (four) times daily. 40 capsule 0    BP 205/88  Pulse 78  Temp(Src) 98.1 F (36.7 C) (Oral)  Resp 16  Ht 5\' 4"  (1.626 m)  Wt 180 lb (81.647 kg)  BMI 30.90 kg/m2  SpO2 98%  Physical Exam  Constitutional: She is oriented to person, place, and time. She appears well-developed and well-nourished. No distress.  HENT:  Head: Normocephalic and atraumatic.  Mouth/Throat: Oropharynx is clear and moist.  Eyes: Conjunctivae and EOM are normal. Pupils are equal, round, and reactive to light.  Neck: Normal range of motion. Neck supple.    Cardiovascular: Regular rhythm and normal heart sounds.   Pulmonary/Chest: Effort normal and breath sounds normal. No respiratory distress.  Abdominal: Soft. Bowel sounds are normal. There is no tenderness. There is no rebound and no guarding.  Musculoskeletal: Normal range of motion. She exhibits no edema and no tenderness.       Bilateral BKAs  Neurological: She is alert and oriented to person, place, and time. No cranial nerve deficit.       Patient alert oriented to person, place, situation.  No facial asymmetry, 5 out of 5 strength throughout  Skin: Skin is warm.    ED Course  Procedures (including critical care time)  Labs Reviewed  CBC - Abnormal; Notable for the following:    RBC 3.57 (*)    Hemoglobin 9.7 (*)    HCT 31.4 (*)    All other components within normal limits  COMPREHENSIVE METABOLIC PANEL - Abnormal; Notable for the following:    Potassium 3.3 (*)    CO2 34 (*)    Glucose, Bld 139 (*)    BUN 26 (*)    Creatinine, Ser 3.86 (*)    Albumin 3.0 (*)    Total Bilirubin 0.2 (*)    GFR calc non Af Amer 10 (*)    GFR calc Af Amer 12 (*)    All other components within normal limits  URINALYSIS, ROUTINE W REFLEX MICROSCOPIC - Abnormal; Notable for the following:    APPearance CLOUDY (*)    Hgb urine dipstick SMALL (*)    Protein, ur >300 (*)    Leukocytes, UA MODERATE (*)    All other components within normal limits  URINE MICROSCOPIC-ADD ON - Abnormal; Notable for the following:    Bacteria, UA FEW (*)    All other components within normal limits  DIFFERENTIAL  AMMONIA  LACTIC ACID, PLASMA  CARDIAC PANEL(CRET KIN+CKTOT+MB+TROPI)  URINE CULTURE   Dg Chest 2 View  08/15/2011  *RADIOLOGY REPORT*  Clinical Data: Shortness of breath, cough, diabetes  CHEST - 2 VIEW  Comparison: 08/11/2011  Findings: Enlargement of cardiac silhouette. Calcified thoracic aorta. Pulmonary vascularity normal. Minimal atelectasis right base. Lungs otherwise clear. No pleural effusion  or pneumothorax. Diffuse osseous demineralization.  IMPRESSION: Enlargement of cardiac silhouette. Minimal right basilar atelectasis.  Original Report Authenticated By: Lollie Marrow, M.D.   Ct Head Wo Contrast  08/15/2011  *RADIOLOGY REPORT*  Clinical Data: Altered mental status.  CT HEAD WITHOUT CONTRAST  Technique:  Contiguous axial images were obtained from the base of the skull through the vertex without contrast.  Comparison: 08/11/2011  Findings: Diffuse  cerebral atrophy.  Mild ventricular dilatation consistent with central atrophy.  Cavum septum vergae.  Low attenuation change in the deep white matter consistent with small vessel ischemia.  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  Calcification of the falx. No mass effect or midline shift.  No abnormal extra-axial fluid collections.  Gray-white matter junctions are distinct.  Basal cisterns are not effaced.  No evidence of acute intracranial hemorrhage.  Vascular calcifications.  Visualized paranasal sinuses are not opacified.  No depressed skull fractures.  Stable appearance since previous study.  IMPRESSION: Chronic atrophy and small vessel ischemic changes.  No evidence of acute intracranial hemorrhage, mass lesion, or acute infarct.  Original Report Authenticated By: Marlon Pel, M.D.     1. Urinary tract infection       MDM  Reported altered mental status with recent UTI. Patient appears to be at her baseline is not confused. She denies any complaints.   CT head negative. Labs unremarkable. Patient does have elevated creatinine and BUN consistent with her end stage renal disease history. urinalysis positive for infection. We'll treat with Rocephin. Patient states she feels fine and wants to go home. She is due for dialysis on Wednesday.     Glynn Octave, MD 08/15/11 2328

## 2011-08-15 NOTE — ED Notes (Signed)
Pt. From [facility] with altered mental status according to staff.  Pt. Was treated with Cipro for 3 days starting on 08/11/2011 for "generalized weakness."  Staff reports she has not been the same since.  Pt. Is alert and oriented x 2 with no reports of pain.  Grips are equal Slight droop to corner of mouth on right.

## 2011-08-17 LAB — URINE CULTURE: Colony Count: >=100000

## 2011-08-21 NOTE — ED Notes (Signed)
Patient notified

## 2011-08-21 NOTE — ED Notes (Signed)
Urine culture result faxed to nursing home. Pt treated per EDP, no further treatment needed per EDP

## 2011-08-25 ENCOUNTER — Encounter (INDEPENDENT_AMBULATORY_CARE_PROVIDER_SITE_OTHER): Payer: Medicare Other | Admitting: *Deleted

## 2011-08-25 DIAGNOSIS — N184 Chronic kidney disease, stage 4 (severe): Secondary | ICD-10-CM

## 2011-08-25 DIAGNOSIS — Z01810 Encounter for preprocedural cardiovascular examination: Secondary | ICD-10-CM

## 2011-08-25 DIAGNOSIS — T82898A Other specified complication of vascular prosthetic devices, implants and grafts, initial encounter: Secondary | ICD-10-CM

## 2011-09-05 NOTE — Procedures (Unsigned)
CEPHALIC VEIN MAPPING  INDICATION:  Clotted arteriovenous graft for dialysis access.  HISTORY: Known inadequacy of bilateral cephalic veins for possible fistula use.  EXAM: The right cephalic and basilic veins were not evaluated due to previous reports of inadequacy.  The left cephalic vein was not evaluated.  The left basilic vein is compressible.  Diameter measurements range from 0.28 to 0.21 cm.  See attached worksheet for all measurements.  IMPRESSION:  Patent left basilic vein with measurements as described above.  ___________________________________________ Fransisco Hertz, MD  LT/MEDQ  D:  08/29/2011  T:  08/29/2011  Job:  [id number]

## 2011-09-14 ENCOUNTER — Encounter (HOSPITAL_COMMUNITY): Payer: Self-pay | Admitting: Emergency Medicine

## 2011-09-14 ENCOUNTER — Emergency Department (HOSPITAL_COMMUNITY)
Admission: EM | Admit: 2011-09-14 | Discharge: 2011-09-14 | Disposition: A | Discharge status: Home / Self Care | Payer: Medicare Other | Attending: Emergency Medicine | Admitting: Emergency Medicine

## 2011-09-14 ENCOUNTER — Emergency Department (HOSPITAL_COMMUNITY): Payer: Medicare Other

## 2011-09-14 DIAGNOSIS — E785 Hyperlipidemia, unspecified: Secondary | ICD-10-CM | POA: Insufficient documentation

## 2011-09-14 DIAGNOSIS — N2581 Secondary hyperparathyroidism of renal origin: Secondary | ICD-10-CM | POA: Insufficient documentation

## 2011-09-14 DIAGNOSIS — S88119A Complete traumatic amputation at level between knee and ankle, unspecified lower leg, initial encounter: Secondary | ICD-10-CM | POA: Insufficient documentation

## 2011-09-14 DIAGNOSIS — K219 Gastro-esophageal reflux disease without esophagitis: Secondary | ICD-10-CM | POA: Insufficient documentation

## 2011-09-14 DIAGNOSIS — Z7982 Long term (current) use of aspirin: Secondary | ICD-10-CM | POA: Insufficient documentation

## 2011-09-14 DIAGNOSIS — I739 Peripheral vascular disease, unspecified: Secondary | ICD-10-CM | POA: Insufficient documentation

## 2011-09-14 DIAGNOSIS — I509 Heart failure, unspecified: Secondary | ICD-10-CM | POA: Insufficient documentation

## 2011-09-14 DIAGNOSIS — E119 Type 2 diabetes mellitus without complications: Secondary | ICD-10-CM | POA: Insufficient documentation

## 2011-09-14 DIAGNOSIS — R059 Cough, unspecified: Secondary | ICD-10-CM | POA: Insufficient documentation

## 2011-09-14 DIAGNOSIS — N186 End stage renal disease: Secondary | ICD-10-CM | POA: Insufficient documentation

## 2011-09-14 DIAGNOSIS — I517 Cardiomegaly: Secondary | ICD-10-CM | POA: Insufficient documentation

## 2011-09-14 DIAGNOSIS — I120 Hypertensive chronic kidney disease with stage 5 chronic kidney disease or end stage renal disease: Secondary | ICD-10-CM | POA: Insufficient documentation

## 2011-09-14 DIAGNOSIS — Z992 Dependence on renal dialysis: Secondary | ICD-10-CM | POA: Insufficient documentation

## 2011-09-14 DIAGNOSIS — R05 Cough: Secondary | ICD-10-CM

## 2011-09-14 DIAGNOSIS — Z794 Long term (current) use of insulin: Secondary | ICD-10-CM | POA: Insufficient documentation

## 2011-09-14 HISTORY — DX: Gastro-esophageal reflux disease without esophagitis: K21.9

## 2011-09-14 HISTORY — DX: Secondary hyperparathyroidism of renal origin: N25.81

## 2011-09-14 HISTORY — DX: Peripheral vascular disease, unspecified: I73.9

## 2011-09-14 LAB — BASIC METABOLIC PANEL
BUN: 57 mg/dL — ABNORMAL HIGH (ref 6–23)
CO2: 33 mEq/L — ABNORMAL HIGH (ref 19–32)
Calcium: 9.2 mg/dL (ref 8.4–10.5)
Chloride: 98 mEq/L (ref 96–112)
Creatinine, Ser: 5.88 mg/dL — ABNORMAL HIGH (ref 0.50–1.10)
GFR calc Af Amer: 7 mL/min — ABNORMAL LOW (ref >90)
GFR calc non Af Amer: 6 mL/min — ABNORMAL LOW (ref >90)
Glucose, Bld: 195 mg/dL — ABNORMAL HIGH (ref 70–99)
Potassium: 3.3 mEq/L — ABNORMAL LOW (ref 3.5–5.1)
Sodium: 139 mEq/L (ref 135–145)

## 2011-09-14 LAB — DIFFERENTIAL
Basophils Absolute: 0.1 10*3/uL (ref 0.0–0.1)
Basophils Relative: 1 % (ref 0–1)
Eosinophils Relative: 2 % (ref 0–5)
Lymphocytes Relative: 25 % (ref 12–46)
Lymphs Abs: 1.8 10*3/uL (ref 0.7–4.0)
Monocytes Relative: 7 % (ref 3–12)
Neutro Abs: 4.8 10*3/uL (ref 1.7–7.7)

## 2011-09-14 LAB — CBC
HCT: 33.8 % — ABNORMAL LOW (ref 36.0–46.0)
Hemoglobin: 10.9 g/dL — ABNORMAL LOW (ref 12.0–15.0)
MCV: 86.7 fL (ref 78.0–100.0)
RBC: 3.9 MIL/uL (ref 3.87–5.11)
WBC: 7.3 10*3/uL (ref 4.0–10.5)

## 2011-09-14 LAB — PRO B NATRIURETIC PEPTIDE: Pro B Natriuretic peptide (BNP): 8852 pg/mL — ABNORMAL HIGH (ref 0–450)

## 2011-09-14 LAB — TROPONIN I: Troponin I: <0.3 ng/mL (ref <0.30)

## 2011-09-14 MED ORDER — SODIUM CHLORIDE 0.9 % IV SOLN
Freq: Once | INTRAVENOUS | Status: AC
  Administered 2011-09-14: 12:00:00 via INTRAVENOUS

## 2011-09-14 MED ORDER — ALBUTEROL SULFATE (5 MG/ML) 0.5% IN NEBU
2.5000 mg | INHALATION_SOLUTION | Freq: Once | RESPIRATORY_TRACT | Status: AC
  Administered 2011-09-14: 2.5 mg via RESPIRATORY_TRACT
  Filled 2011-09-14: qty 0.5

## 2011-09-14 NOTE — ED Notes (Signed)
Family at bedside. 

## 2011-09-14 NOTE — ED Provider Notes (Deleted)
History   This chart was scribed for [employer]. Colon Branch, MD by Clarita Crane. The patient was seen in room APA18/APA18. Patient's care was started at 1151.    CSN: [id number]  Arrival date & time 09/14/11  1151   First MD Initiated Contact with Patient 09/14/11 1204      Chief Complaint  Patient presents with  . Altered Mental Status    (Consider location/radiation/quality/duration/timing/severity/associated sxs/prior treatment) HPI Linda [name] is a 78 y.o. female who presents to the Emergency Department from [facility] to be evaluated for Altered Mental Status. Patient was scheduled to receive dialysis this morning but was referred to ED prior to receiving treatment. Patient receives hemodialysis and is scheduled for Monday, Wednesday and Fridays. Patient also notes she has been experiencing a non-productive cough for approximately 1 week.    Past Medical History  Diagnosis Date  . Syncope   . End stage kidney disease   . Hyperlipidemia   . Anemia   . Hypertension   . 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 Diabetes mellitus   . GERD (gastroesophageal reflux disease)   . Secondary hyperparathyroidism   . PVD (peripheral vascular disease)     History reviewed. No pertinent past surgical history.  History reviewed. No pertinent family history.  History  Substance Use Topics  . Smoking status: Never Smoker   . Smokeless tobacco: Never Used  . Alcohol Use: No    OB History    Grav Para Term Preterm Abortions TAB SAB Ect Mult Living   3 3 3              Review of Systems   A 10 review of systems reviewed and are negative for acute change except as noted in the HPI. Allergies  Percocet  Home Medications   Current Outpatient Rx  Name Route Sig Dispense Refill  . PRO-STAT PO LIQD Oral Take 30 mLs by mouth 3 (three) times daily. Wild cherry    . ASPIRIN 81 MG PO CHEW Oral Chew 81 mg by mouth daily.    Marland Kitchen NEPHRO-VITE 0.8  MG PO TABS Oral Take 0.8 mg by mouth at bedtime.    Marland Kitchen CIPROFLOXACIN HCL 250 MG PO TABS Oral Take 250 mg by mouth 2 (two) times daily.    Marland Kitchen CLONIDINE HCL 0.2 MG PO TABS Oral Take 0.2 mg by mouth 2 (two) times daily.    Marland Kitchen CRANBERRY 400 MG PO TABS Oral Take 2 tablets by mouth 2 (two) times daily.    Marland Kitchen DILTIAZEM HCL ER COATED BEADS 300 MG PO CP24 Oral Take 300 mg by mouth daily.    . ENALAPRIL MALEATE 10 MG PO TABS Oral Take 10 mg by mouth daily.    . CHROMAGEN PO Oral Take 1 capsule by mouth daily.    . INSULIN DETEMIR 100 UNIT/ML [place] SOLN Subcutaneous Inject 10 Units into the skin at bedtime.    . INSULIN LISPRO (HUMAN) 100 UNIT/ML [place] SOLN Subcutaneous Inject 5 Units into the skin 2 (two) times daily before lunch and supper.    Marland Kitchen POTASSIUM CHLORIDE CRYS ER 10 MEQ PO TBCR Oral Take 40 mEq by mouth daily.    Marland Kitchen RANITIDINE HCL 75 MG PO TABS Oral Take 75 mg by mouth 2 (two) times daily.    Marland Kitchen SIMVASTATIN 10 MG PO TABS Oral Take 10 mg by mouth at bedtime.      BP 162/50  Pulse 55  Temp(Src) 98.4 F (  36.9 C) (Oral)  Resp 11  Ht 5\' 4"  (1.626 m)  Wt 136 lb 11 oz (62 kg)  BMI 23.46 kg/m2  SpO2 100%  Physical Exam  Nursing note and vitals reviewed. Constitutional: She is oriented to person, place, and time. She appears well-developed and well-nourished. No distress.  HENT:  Head: Normocephalic and atraumatic.  Eyes: EOM are normal. Pupils are equal, round, and reactive to light.  Neck: Neck supple. No tracheal deviation present.  Cardiovascular: Normal rate and regular rhythm.  Exam reveals no gallop and no friction rub.   No murmur heard. Pulmonary/Chest: Effort normal. No respiratory distress. She has no wheezes.       Actively coughing during exam. Course upper airway breath sounds.Few crackles in bilateral bases.  Abdominal: Soft. She exhibits no distension. There is no tenderness.  Musculoskeletal: Normal range of motion. She exhibits no edema.       Bilateral BKAs.  Neurological: She is  alert and oriented to person, place, and time. No sensory deficit.       Answer questions appropriately. Intentional tremor. Strength of LUE greater than RUE but patient reports she is left handed.  Skin: Skin is warm and dry.  Psychiatric: She has a normal mood and affect. Her behavior is normal.    ED Course  Procedures (including critical care time)  DIAGNOSTIC STUDIES: Oxygen Saturation is 94% on room air, adequate by my interpretation.    COORDINATION OF CARE:    Results for orders placed during the hospital encounter of 09/14/11  CBC      Component Value Range   WBC 7.3  4.0 - 10.5 (K/uL)   RBC 3.90  3.87 - 5.11 (MIL/uL)   Hemoglobin 10.9 (*) 12.0 - 15.0 (g/dL)   HCT 91.4 (*) 78.2 - 46.0 (%)   MCV 86.7  78.0 - 100.0 (fL)   MCH 27.9  26.0 - 34.0 (pg)   MCHC 32.2  30.0 - 36.0 (g/dL)   RDW 95.6  21.3 - 08.6 (%)   Platelets 151  150 - 400 (K/uL)  DIFFERENTIAL      Component Value Range   Neutrophils Relative 65  43 - 77 (%)   Lymphocytes Relative 25  12 - 46 (%)   Monocytes Relative 7  3 - 12 (%)   Eosinophils Relative 2  0 - 5 (%)   Basophils Relative 1  0 - 1 (%)   Neutro Abs 4.8  1.7 - 7.7 (K/uL)   Lymphs Abs 1.8  0.7 - 4.0 (K/uL)   Monocytes Absolute 0.5  0.1 - 1.0 (K/uL)   Eosinophils Absolute 0.1  0.0 - 0.7 (K/uL)   Basophils Absolute 0.1  0.0 - 0.1 (K/uL)   WBC Morphology ATYPICAL LYMPHOCYTES     Smear Review PLATELET COUNT CONFIRMED BY SMEAR    BASIC METABOLIC PANEL      Component Value Range   Sodium 139  135 - 145 (mEq/L)   Potassium 3.3 (*) 3.5 - 5.1 (mEq/L)   Chloride 98  96 - 112 (mEq/L)   CO2 33 (*) 19 - 32 (mEq/L)   Glucose, Bld 195 (*) 70 - 99 (mg/dL)   BUN 57 (*) 6 - 23 (mg/dL)   Creatinine, Ser 5.78 (*) 0.50 - 1.10 (mg/dL)   Calcium 9.2  8.4 - 46.9 (mg/dL)   GFR calc non Af Amer 6 (*) >90 (mL/min)   GFR calc Af Amer 7 (*) >90 (mL/min)  TROPONIN I      Component Value  Range   Troponin I <0.30  <0.30 (ng/mL)  PRO B NATRIURETIC PEPTIDE       Component Value Range   Pro B Natriuretic peptide (BNP) 8852.0 (*) 0 - 450 (pg/mL)    Dg Chest Portable 1 View  09/14/2011  *RADIOLOGY REPORT*  Clinical Data: Altered mental status.  PORTABLE CHEST - 1 VIEW  Comparison: 08/15/2011.  Findings: Trachea is midline.  Heart is enlarged, stable.  Lungs are low in volume with right basilar scar and/or atelectasis. Difficult to exclude small right pleural effusion.  IMPRESSION: Low lung volumes with right basilar subsegmental atelectasis or scarring.  Difficult to exclude tiny right pleural effusion.  Original Report Authenticated By: Reyes Ivan, M.D.        MDM   Patient with h/o ESRD on dialysis, DM, HTB, PVD, here before dialysis today for altered level of consciousness. Patient has been alert and oriented throughout ER visit.Labs c/w ESRD. While BNP is 8852, there is no frack CHF on xray and patient is not short of breath. She was observed in the ER. She received a meal, took PO fluids. Remained stable.The patient appears reasonably screened and/or stabilized for discharge and I doubt any other medical condition or other [facility] requiring further screening, evaluation, or treatment in the ED at this time prior to discharge.  I personally performed the services described in this documentation, which was scribed in my presence. The recorded information has been reviewed and considered.   MDM Reviewed: previous chart, nursing note and vitals Reviewed previous: labs and x-ray Interpretation: labs and x-ray        Nicoletta Dress. Colon Branch, MD 09/16/11 1633  Nicoletta Dress. Colon Branch, MD 09/16/11 1635  Nicoletta Dress. Colon Branch, MD 09/16/11 1749  Nicoletta Dress. Colon Branch, MD 09/16/11 1753

## 2011-09-14 NOTE — ED Notes (Signed)
[facility] called for transport. Stating they would be unable to pick her up until 1630. Patient will be transported via Ambulance back to [facility].

## 2011-09-14 NOTE — ED Notes (Signed)
Patient does not need anything at this time. 

## 2011-09-14 NOTE — Discharge Instructions (Signed)
The blood work, EKG, chest xray, and heart numbers were normal here today. She needs to be dialyzed. Continue her regular medicines.

## 2011-09-14 NOTE — ED Notes (Signed)
Patient resting with eyes closed. NAD noted.

## 2011-09-14 NOTE — ED Notes (Signed)
Respiratory paged for treatment.

## 2011-09-14 NOTE — ED Notes (Signed)
Sent via EMS from [facility] for eval. Of altered mental status.

## 2011-09-14 NOTE — ED Notes (Signed)
Patient with no complaints at this time. Respirations even and unlabored. Skin warm/dry. Discharge instructions reviewed with patient at this time. Patient given opportunity to voice concerns/ask questions. IV removed per policy and band-aid applied to site. Patient discharged at this time and left Emergency Department via wheelchair.  

## 2011-11-16 ENCOUNTER — Other Ambulatory Visit (HOSPITAL_COMMUNITY): Payer: Self-pay | Admitting: Nephrology

## 2011-11-16 ENCOUNTER — Ambulatory Visit (HOSPITAL_COMMUNITY)
Admission: RE | Admit: 2011-11-16 | Discharge: 2011-11-16 | Disposition: A | Discharge status: Home / Self Care | Payer: Medicare Other | Source: Ambulatory Visit | Attending: Nephrology | Admitting: Nephrology

## 2011-11-16 ENCOUNTER — Encounter (HOSPITAL_COMMUNITY): Payer: Self-pay

## 2011-11-16 DIAGNOSIS — T82868A Thrombosis of vascular prosthetic devices, implants and grafts, initial encounter: Secondary | ICD-10-CM

## 2011-11-16 DIAGNOSIS — E119 Type 2 diabetes mellitus without complications: Secondary | ICD-10-CM | POA: Insufficient documentation

## 2011-11-16 DIAGNOSIS — I770 Arteriovenous fistula, acquired: Secondary | ICD-10-CM | POA: Insufficient documentation

## 2011-11-16 DIAGNOSIS — Z79899 Other long term (current) drug therapy: Secondary | ICD-10-CM | POA: Insufficient documentation

## 2011-11-16 DIAGNOSIS — Z794 Long term (current) use of insulin: Secondary | ICD-10-CM | POA: Insufficient documentation

## 2011-11-16 DIAGNOSIS — E785 Hyperlipidemia, unspecified: Secondary | ICD-10-CM | POA: Insufficient documentation

## 2011-11-16 DIAGNOSIS — N186 End stage renal disease: Secondary | ICD-10-CM | POA: Insufficient documentation

## 2011-11-16 DIAGNOSIS — Y832 Surgical operation with anastomosis, bypass or graft as the cause of abnormal reaction of the patient, or of later complication, without mention of misadventure at the time of the procedure: Secondary | ICD-10-CM | POA: Insufficient documentation

## 2011-11-16 DIAGNOSIS — Z7982 Long term (current) use of aspirin: Secondary | ICD-10-CM | POA: Insufficient documentation

## 2011-11-16 DIAGNOSIS — K219 Gastro-esophageal reflux disease without esophagitis: Secondary | ICD-10-CM | POA: Insufficient documentation

## 2011-11-16 DIAGNOSIS — I120 Hypertensive chronic kidney disease with stage 5 chronic kidney disease or end stage renal disease: Secondary | ICD-10-CM | POA: Insufficient documentation

## 2011-11-16 DIAGNOSIS — T82898A Other specified complication of vascular prosthetic devices, implants and grafts, initial encounter: Secondary | ICD-10-CM | POA: Insufficient documentation

## 2011-11-16 DIAGNOSIS — I739 Peripheral vascular disease, unspecified: Secondary | ICD-10-CM | POA: Insufficient documentation

## 2011-11-16 DIAGNOSIS — N2581 Secondary hyperparathyroidism of renal origin: Secondary | ICD-10-CM | POA: Insufficient documentation

## 2011-11-16 LAB — POTASSIUM: Potassium: 3.6 mEq/L (ref 3.5–5.1)

## 2011-11-16 MED ORDER — ALTEPLASE 2 MG IJ SOLR
2.0000 mg | Freq: Once | INTRAMUSCULAR | Status: AC
  Administered 2011-11-16: 2 mg

## 2011-11-16 MED ORDER — ALTEPLASE 2 MG IJ SOLR
INTRAMUSCULAR | Status: AC
  Filled 2011-11-16: qty 2

## 2011-11-16 MED ORDER — IOHEXOL 300 MG/ML  SOLN: 40.0000 mL | Freq: Once | INTRAMUSCULAR | Status: AC | PRN

## 2011-11-16 MED ORDER — HEPARIN SODIUM (PORCINE) 1000 UNIT/ML IJ SOLN
INTRAMUSCULAR | Status: AC
  Administered 2011-11-16: 3000 [IU] via INTRAVASCULAR
  Filled 2011-11-16: qty 1

## 2011-11-16 MED ORDER — LIDOCAINE HCL 1 % IJ SOLN
INTRAMUSCULAR | Status: AC
  Filled 2011-11-16: qty 20

## 2011-11-16 NOTE — Procedures (Signed)
Successful declot of upper arm graft.  See Radiology report.  No immediate complication.

## 2011-11-16 NOTE — H&P (Signed)
Chief Complaint: Clotted dialysis graft HPI: Linda [name] is an 78 y.o. female with ESRD and clotted right arm AVGG presents today for thrombolysis, possible angioplasty/stenting of AVGG or placement of new catheter if necessary.   Past Medical History:  Past Medical History  Diagnosis Date  . Syncope   . End stage kidney disease   . Hyperlipidemia   . Anemia   . Hypertension   . 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 Diabetes mellitus   . GERD (gastroesophageal reflux disease)   . Secondary hyperparathyroidism   . PVD (peripheral vascular disease)     Past Surgical History: vascular access procedures, bilat LE amputations   Family History: No family history on file.  Social History:  reports that she has never smoked. She has never used smokeless tobacco. She reports that she does not drink alcohol or use illicit drugs.  Allergies:  Allergies  Allergen Reactions  . Percocet (Oxycodone-Acetaminophen)     Medications: Medications Prior to Admission   Medication  Sig  Dispense  Refill   .  Amino Acids-Protein Hydrolys (PRO-STAT) LIQD  Take 30 mLs by mouth 3 (three) times daily. Wild cherry     .  aspirin EC 81 MG tablet  Take 81 mg by mouth daily.     .  cloNIDine (CATAPRES) 0.2 MG tablet  Take 0.2 mg by mouth 2 (two) times daily.     .  Cranberry 400 MG TABS  Take 2 tablets by mouth 2 (two) times daily.     Marland Kitchen  diltiazem (CARDIZEM CD) 300 MG 24 hr capsule  Take 300 mg by mouth daily.     .  enalapril (VASOTEC) 10 MG tablet  Take 10 mg by mouth daily.     Marland Kitchen  Fe-Succ-C-Thre-B12-Des Stomach (CHROMAGEN PO)  Take 1 capsule by mouth daily.     .  insulin detemir (LEVEMIR) 100 UNIT/ML injection  Inject 10 Units into the skin at bedtime.     .  insulin lispro (HUMALOG) 100 UNIT/ML injection  Inject 5 Units into the skin 2 (two) times daily before lunch and supper.     .  potassium chloride (K-DUR,KLOR-CON) 10 MEQ tablet  Take 40 mEq by  mouth daily.     .  ranitidine (ZANTAC) 75 MG tablet  Take 75 mg by mouth 2 (two) times daily.     .  simvastatin (ZOCOR) 10 MG tablet  Take 10 mg by mouth at bedtime.        Please HPI for pertinent positives, otherwise complete 10 system ROS negative.  There were no vitals taken for this visit. There is no height or weight on file to calculate BMI.   General Appearance:  Awake and pleasant but not alert time and place, cooperative, no distress, appears stated age.  Head:  Normocephalic, without obvious abnormality, atraumatic  ENT: Unremarkable  Neck: Supple, symmetrical, trachea midline, no adenopathy, thyroid: not enlarged, symmetric, no tenderness/mass/nodules  Lungs:   Clear to auscultation bilaterally, no w/r/r, respirations unlabored without use of accessory muscles.  Heart:  Regular rate and rhythm, S1, S2 normal, no murmur, rub or gallop. Carotids 2+ without bruit.  Neurologic: Normal affect, no gross deficits.   No results found for this or any previous visit (from the past 48 hour(s)). No results found.  Assessment/Plan Patient with ESRD and clotted right arm AVGG; plan is for thrombolysis, possible angioplasty/stenting of graft with placement of new dialysis catheter if necessary.  Consent obtained from daughter via telephone.  Brayton El PA-C 11/16/2011, 3:18 PM

## 2011-11-16 NOTE — Discharge Instructions (Signed)
Dialysis (AV) Shunt, Malfunction  There is a malfunction or blockage in the shunt that is used to carry out your dialysis treatments. The shunt is an access (an entrance or a "way in" to your blood vessels) for the dialysis machine. This access can be made in one of several ways:   Joining an artery to a vein under your skin to make a bigger blood vessel called a "fistula".   Joining an artery to a vein under your skin using a soft tube called a "graft".   Placing a soft tube (called a "catheter") that is placed in a large vein, usually in your neck.  CAUSES    Infection is a common cause of a shunt not working.   A blood clot could have formed inside a part of the fistula, graft, or catheter. This could completely or partly block the flow of blood.   There may have been a kink in the graft or catheter.   A collection of blood (called a "hematoma" or a bruise) next to the graft or catheter could push against the graft or catheter. This could completely or partly block the flow of blood.  SYMPTOMS   Evidence of shunt malfunction may include:   During a routine check of your fistula, graft, or catheter at home, you noticed either a change in the expected "vibration" or pulse, or you found that this "vibration" or pulse was gone.   There is new or unusual swelling of the area (such as your arm) around the access.   There was an unsuccessful puncture of your access by the dialysis team.   During routine dialysis, your dialysis team found that the flow of blood through the fistula, graft, or catheter was too slow for effective dialysis.   When routine dialysis was completed and the needle was removed, your dialysis team found that bleeding lasted for too long a time.  DIAGNOSIS   Your caregiver may order laboratory (blood) work, cultures, and a special X-ray test in order to learn what may be wrong with your shunt. This test involves the injection of a special liquid into the shunt that can be seen on X-ray.  This helps your caregiver see if there really is a blockage in the shunt. If seen, the blockage can then be treated.   TREATMENT    If infection is found in the shunt, your caregiver may prescribe antibiotic medication to control the infection.   If a clot is found in the shunt, you will likely be referred to your surgeon. Several methods can be used to remove the clot so that your shunt is working properly and can still be used for your regular dialysis.   If a blockage in the shunt is found to be due to some other cause (such as a kink in a graft), then you will likely be referred to your surgeon. The surgeon could discuss methods to unblock the shunt, or there may be discussion about having the shunt replaced.   If you receive a referral to a surgeon or other physician, it is important that you follow up exactly as instructed. Any delay in follow up could cause permanent dysfunction of the shunt which may be dangerous.  SEEK MEDICAL CARE IF:    Fever develops.   Swelling and pain around the shunt gets worse or new pain develops.   Unusual bleeding develops either at the location of the shunt or from any other area.   Pain, numbness, or an   unusual pale skin color develops in the hand on the side of your shunt.   Dizziness or weakness develops that you have not had before.   Shortness of breath develops.   Chest pain develops.  Document Released: 06/06/2006 Document Revised: 06/23/2011 Document Reviewed: 07/26/2010  [employer] Patient Information 2012 [employer].

## 2012-02-05 ENCOUNTER — Ambulatory Visit: Payer: Self-pay | Admitting: Vascular Surgery

## 2012-03-21 ENCOUNTER — Ambulatory Visit: Payer: Self-pay | Admitting: Vascular Surgery

## 2012-08-02 ENCOUNTER — Inpatient Hospital Stay (HOSPITAL_COMMUNITY)
Admission: EM | Admit: 2012-08-02 | Discharge: 2012-08-17 | DRG: 037 | Disposition: A | Discharge status: Other Acute Inpatient Hospital | Payer: Medicare Other | Attending: Internal Medicine | Admitting: Internal Medicine

## 2012-08-02 ENCOUNTER — Encounter (HOSPITAL_COMMUNITY): Payer: Self-pay | Admitting: Emergency Medicine

## 2012-08-02 ENCOUNTER — Emergency Department (HOSPITAL_COMMUNITY): Payer: Medicare Other

## 2012-08-02 DIAGNOSIS — T82898A Other specified complication of vascular prosthetic devices, implants and grafts, initial encounter: Secondary | ICD-10-CM | POA: Diagnosis not present

## 2012-08-02 DIAGNOSIS — R29810 Facial weakness: Secondary | ICD-10-CM | POA: Diagnosis present

## 2012-08-02 DIAGNOSIS — E119 Type 2 diabetes mellitus without complications: Secondary | ICD-10-CM

## 2012-08-02 DIAGNOSIS — D631 Anemia in chronic kidney disease: Secondary | ICD-10-CM | POA: Diagnosis present

## 2012-08-02 DIAGNOSIS — Z992 Dependence on renal dialysis: Secondary | ICD-10-CM

## 2012-08-02 DIAGNOSIS — G8190 Hemiplegia, unspecified affecting unspecified side: Secondary | ICD-10-CM | POA: Diagnosis present

## 2012-08-02 DIAGNOSIS — G459 Transient cerebral ischemic attack, unspecified: Secondary | ICD-10-CM

## 2012-08-02 DIAGNOSIS — L8990 Pressure ulcer of unspecified site, unspecified stage: Secondary | ICD-10-CM

## 2012-08-02 DIAGNOSIS — E785 Hyperlipidemia, unspecified: Secondary | ICD-10-CM | POA: Diagnosis present

## 2012-08-02 DIAGNOSIS — I739 Peripheral vascular disease, unspecified: Secondary | ICD-10-CM | POA: Diagnosis present

## 2012-08-02 DIAGNOSIS — Z79899 Other long term (current) drug therapy: Secondary | ICD-10-CM

## 2012-08-02 DIAGNOSIS — R4182 Altered mental status, unspecified: Secondary | ICD-10-CM

## 2012-08-02 DIAGNOSIS — N186 End stage renal disease: Secondary | ICD-10-CM

## 2012-08-02 DIAGNOSIS — S88119A Complete traumatic amputation at level between knee and ankle, unspecified lower leg, initial encounter: Secondary | ICD-10-CM

## 2012-08-02 DIAGNOSIS — R627 Adult failure to thrive: Secondary | ICD-10-CM | POA: Diagnosis present

## 2012-08-02 DIAGNOSIS — I120 Hypertensive chronic kidney disease with stage 5 chronic kidney disease or end stage renal disease: Secondary | ICD-10-CM | POA: Diagnosis present

## 2012-08-02 DIAGNOSIS — L89159 Pressure ulcer of sacral region, unspecified stage: Secondary | ICD-10-CM | POA: Diagnosis present

## 2012-08-02 DIAGNOSIS — Z794 Long term (current) use of insulin: Secondary | ICD-10-CM

## 2012-08-02 DIAGNOSIS — Y832 Surgical operation with anastomosis, bypass or graft as the cause of abnormal reaction of the patient, or of later complication, without mention of misadventure at the time of the procedure: Secondary | ICD-10-CM | POA: Diagnosis not present

## 2012-08-02 DIAGNOSIS — K219 Gastro-esophageal reflux disease without esophagitis: Secondary | ICD-10-CM | POA: Diagnosis present

## 2012-08-02 DIAGNOSIS — I639 Cerebral infarction, unspecified: Secondary | ICD-10-CM | POA: Diagnosis present

## 2012-08-02 DIAGNOSIS — Z23 Encounter for immunization: Secondary | ICD-10-CM

## 2012-08-02 DIAGNOSIS — I6350 Cerebral infarction due to unspecified occlusion or stenosis of unspecified cerebral artery: Principal | ICD-10-CM | POA: Diagnosis present

## 2012-08-02 DIAGNOSIS — IMO0002 Reserved for concepts with insufficient information to code with codable children: Secondary | ICD-10-CM

## 2012-08-02 DIAGNOSIS — R55 Syncope and collapse: Secondary | ICD-10-CM | POA: Diagnosis present

## 2012-08-02 DIAGNOSIS — Z681 Body mass index (BMI) 19 or less, adult: Secondary | ICD-10-CM

## 2012-08-02 DIAGNOSIS — I6529 Occlusion and stenosis of unspecified carotid artery: Secondary | ICD-10-CM | POA: Diagnosis present

## 2012-08-02 DIAGNOSIS — R1310 Dysphagia, unspecified: Secondary | ICD-10-CM | POA: Diagnosis present

## 2012-08-02 DIAGNOSIS — I10 Essential (primary) hypertension: Secondary | ICD-10-CM | POA: Diagnosis present

## 2012-08-02 DIAGNOSIS — L89109 Pressure ulcer of unspecified part of back, unspecified stage: Secondary | ICD-10-CM

## 2012-08-02 DIAGNOSIS — D649 Anemia, unspecified: Secondary | ICD-10-CM | POA: Diagnosis present

## 2012-08-02 DIAGNOSIS — Z66 Do not resuscitate: Secondary | ICD-10-CM | POA: Diagnosis not present

## 2012-08-02 DIAGNOSIS — Z7982 Long term (current) use of aspirin: Secondary | ICD-10-CM

## 2012-08-02 DIAGNOSIS — R52 Pain, unspecified: Secondary | ICD-10-CM

## 2012-08-02 DIAGNOSIS — N2581 Secondary hyperparathyroidism of renal origin: Secondary | ICD-10-CM | POA: Diagnosis present

## 2012-08-02 DIAGNOSIS — L8994 Pressure ulcer of unspecified site, stage 4: Secondary | ICD-10-CM | POA: Diagnosis present

## 2012-08-02 LAB — GLUCOSE, CAPILLARY
Glucose-Capillary: 66 mg/dL — ABNORMAL LOW (ref 70–99)
Glucose-Capillary: 80 mg/dL (ref 70–99)
Glucose-Capillary: 91 mg/dL (ref 70–99)

## 2012-08-02 LAB — CBC WITH DIFFERENTIAL/PLATELET
Basophils Absolute: 0 10*3/uL (ref 0.0–0.1)
Eosinophils Absolute: 0 10*3/uL (ref 0.0–0.7)
Eosinophils Relative: 0 % (ref 0–5)
HCT: 27.6 % — ABNORMAL LOW (ref 36.0–46.0)
Lymphocytes Relative: 10 % — ABNORMAL LOW (ref 12–46)
Lymphs Abs: 1.5 10*3/uL (ref 0.7–4.0)
MCH: 26.3 pg (ref 26.0–34.0)
MCV: 86.3 fL (ref 78.0–100.0)
Monocytes Absolute: 0.7 10*3/uL (ref 0.1–1.0)
Platelets: 280 10*3/uL (ref 150–400)
RDW: 18.3 % — ABNORMAL HIGH (ref 11.5–15.5)

## 2012-08-02 LAB — COMPREHENSIVE METABOLIC PANEL
ALT: <5 U/L (ref 0–35)
CO2: 26 mEq/L (ref 19–32)
Calcium: 8.6 mg/dL (ref 8.4–10.5)
Creatinine, Ser: 4.39 mg/dL — ABNORMAL HIGH (ref 0.50–1.10)
GFR calc Af Amer: 10 mL/min — ABNORMAL LOW (ref >90)
GFR calc non Af Amer: 9 mL/min — ABNORMAL LOW (ref >90)
Glucose, Bld: 81 mg/dL (ref 70–99)
Sodium: 137 mEq/L (ref 135–145)
Total Protein: 7.2 g/dL (ref 6.0–8.3)

## 2012-08-02 LAB — TROPONIN I: Troponin I: <0.3 ng/mL (ref <0.30)

## 2012-08-02 LAB — URINALYSIS, ROUTINE W REFLEX MICROSCOPIC
Bilirubin Urine: NEGATIVE
Nitrite: NEGATIVE
Specific Gravity, Urine: 1.016 (ref 1.005–1.030)
Urobilinogen, UA: 0.2 mg/dL (ref 0.0–1.0)
pH: 7.5 (ref 5.0–8.0)

## 2012-08-02 LAB — MRSA PCR SCREENING: MRSA by PCR: POSITIVE — AB

## 2012-08-02 LAB — URINE MICROSCOPIC-ADD ON

## 2012-08-02 MED ORDER — ONDANSETRON HCL 4 MG/2ML IJ SOLN: 4.0000 mg | Freq: Four times a day (QID) | INTRAMUSCULAR | Status: DC | PRN

## 2012-08-02 MED ORDER — DEXTROSE 50 % IV SOLN
1.0000 | Freq: Once | INTRAVENOUS | Status: DC
  Filled 2012-08-02: qty 50

## 2012-08-02 MED ORDER — ASPIRIN 325 MG PO TABS
325.0000 mg | ORAL_TABLET | Freq: Every day | ORAL | Status: DC
  Administered 2012-08-06 – 2012-08-17 (×9): 325 mg via ORAL
  Filled 2012-08-02 (×17): qty 1

## 2012-08-02 MED ORDER — SODIUM CHLORIDE 0.9 % IV SOLN: 250.0000 mL | INTRAVENOUS | Status: DC | PRN

## 2012-08-02 MED ORDER — SODIUM CHLORIDE 0.9 % IV BOLUS (SEPSIS)
250.0000 mL | Freq: Once | INTRAVENOUS | Status: AC
  Administered 2012-08-02: 250 mL via INTRAVENOUS

## 2012-08-02 MED ORDER — ACETAMINOPHEN 325 MG PO TABS
650.0000 mg | ORAL_TABLET | ORAL | Status: DC | PRN
  Filled 2012-08-02: qty 2

## 2012-08-02 MED ORDER — SODIUM CHLORIDE 0.9 % IJ SOLN: 3.0000 mL | INTRAMUSCULAR | Status: DC | PRN

## 2012-08-02 MED ORDER — HEPARIN SODIUM (PORCINE) 5000 UNIT/ML IJ SOLN
5000.0000 [IU] | Freq: Three times a day (TID) | INTRAMUSCULAR | Status: DC
  Administered 2012-08-03 – 2012-08-06 (×10): 5000 [IU] via SUBCUTANEOUS
  Filled 2012-08-02 (×14): qty 1

## 2012-08-02 MED ORDER — DEXTROSE-NACL 5-0.9 % IV SOLN
INTRAVENOUS | Status: DC
  Administered 2012-08-02 – 2012-08-04 (×2): via INTRAVENOUS
  Administered 2012-08-09: 999 mL via INTRAVENOUS

## 2012-08-02 MED ORDER — SODIUM CHLORIDE 0.9 % IJ SOLN
3.0000 mL | Freq: Two times a day (BID) | INTRAMUSCULAR | Status: DC
  Administered 2012-08-03 – 2012-08-13 (×15): 3 mL via INTRAVENOUS

## 2012-08-02 MED ORDER — ASPIRIN 300 MG RE SUPP
300.0000 mg | Freq: Every day | RECTAL | Status: DC
  Administered 2012-08-03 – 2012-08-09 (×7): 300 mg via RECTAL
  Filled 2012-08-02 (×16): qty 1

## 2012-08-02 MED ORDER — ACETAMINOPHEN 650 MG RE SUPP: 650.0000 mg | RECTAL | Status: DC | PRN

## 2012-08-02 NOTE — ED Notes (Signed)
Patient transported to CT 

## 2012-08-02 NOTE — ED Provider Notes (Signed)
History     CSN: [id number]  Arrival date & time 08/02/12  1510   First MD Initiated Contact with Patient 08/02/12 1523      Chief Complaint  Patient presents with  . Facial Droop    (Consider location/radiation/quality/duration/timing/severity/associated sxs/prior treatment) HPI Comments: After dialysis today when she was picked up she became unresponsive with facial droop and moaning but would not respond otherwise.  Nurse states that the pt was normal this am before dialysis and normally is confused but her speech is coherent.  Also states pt has not been eating well and MD recommended a peg tube but do not feel pt is strong enough to go through the procedure.  Denies any recent cough, fever, N/V/D.  Patient is a 79 y.o. female presenting with weakness. The history is provided by the EMS personnel. The history is limited by the absence of a caregiver.  Weakness Primary symptoms comment: nursing home states facial droop today that resolved before arrival  Additional symptoms include weakness.    Past Medical History  Diagnosis Date  . Syncope   . End stage kidney disease   . Hyperlipidemia   . Anemia   . Hypertension   . 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 Diabetes mellitus   . GERD (gastroesophageal reflux disease)   . Secondary hyperparathyroidism   . PVD (peripheral vascular disease)     History reviewed. No pertinent past surgical history.  No family history on file.  History  Substance Use Topics  . Smoking status: Never Smoker   . Smokeless tobacco: Never Used  . Alcohol Use: No    OB History    Grav Para Term Preterm Abortions TAB SAB Ect Mult Living   3 3 3              Review of Systems  Unable to perform ROS Respiratory: Positive for cough. Negative for shortness of breath.   Gastrointestinal: Negative for abdominal pain.  Neurological: Positive for weakness.    Allergies  Percocet  Home Medications    Current Outpatient Rx  Name  Route  Sig  Dispense  Refill  . PRO-STAT PO LIQD   Oral   Take 30 mLs by mouth 3 (three) times daily. Wild cherry         . ASPIRIN 81 MG PO CHEW   Oral   Chew 81 mg by mouth daily.         Marland Kitchen NEPHRO-VITE 0.8 MG PO TABS   Oral   Take 0.8 mg by mouth at bedtime.         Marland Kitchen CIPROFLOXACIN HCL 250 MG PO TABS   Oral   Take 250 mg by mouth 2 (two) times daily.         Marland Kitchen CLONIDINE HCL 0.2 MG PO TABS   Oral   Take 0.2 mg by mouth 2 (two) times daily.         Marland Kitchen CRANBERRY 400 MG PO TABS   Oral   Take 2 tablets by mouth 2 (two) times daily.         Marland Kitchen DILTIAZEM HCL ER COATED BEADS 300 MG PO CP24   Oral   Take 300 mg by mouth daily.         . ENALAPRIL MALEATE 10 MG PO TABS   Oral   Take 10 mg by mouth daily.         . CHROMAGEN PO   Oral  Take 1 capsule by mouth daily.         . INSULIN DETEMIR 100 UNIT/ML [place] SOLN   Subcutaneous   Inject 10 Units into the skin at bedtime.         . INSULIN LISPRO (HUMAN) 100 UNIT/ML [place] SOLN   Subcutaneous   Inject 5 Units into the skin 2 (two) times daily before lunch and supper.         Marland Kitchen POTASSIUM CHLORIDE CRYS ER 10 MEQ PO TBCR   Oral   Take 40 mEq by mouth daily.         Marland Kitchen RANITIDINE HCL 75 MG PO TABS   Oral   Take 75 mg by mouth 2 (two) times daily.         Marland Kitchen SIMVASTATIN 10 MG PO TABS   Oral   Take 10 mg by mouth at bedtime.           BP 158/87  Pulse 109  Temp 98.4 F (36.9 C) (Oral)  Resp 16  SpO2 99%  Physical Exam  Nursing note and vitals reviewed. Constitutional: She appears well-developed and well-nourished. No distress.  HENT:  Head: Normocephalic and atraumatic.  Mouth/Throat: Oropharynx is clear and moist. Mucous membranes are dry.  Eyes: Conjunctivae normal and EOM are normal. Pupils are equal, round, and reactive to light.  Neck: Normal range of motion. Neck supple.  Cardiovascular: Regular rhythm and intact distal pulses.  Tachycardia present.    No murmur heard. Pulmonary/Chest: Effort normal. No respiratory distress. She has no wheezes. She has rhonchi. She has no rales.  Abdominal: Soft. She exhibits no distension. There is no tenderness. There is no rebound and no guarding.  Musculoskeletal: Normal range of motion. She exhibits no edema and no tenderness.       Swelling in the right  Upper ext and good thrill in the fistula.  Bilateral BKA.  Decubitus ulcer over the sacrum  Neurological: She is alert. No sensory deficit.       Very difficult to understand her speech.  Bilateral BKA but moving both upper ext  Skin: Skin is warm and dry. No rash noted. No erythema.  Psychiatric: She has a normal mood and affect. Her behavior is normal.    ED Course  Procedures (including critical care time)  Labs Reviewed  GLUCOSE, CAPILLARY - Abnormal; Notable for the following:    Glucose-Capillary 66 (*)     All other components within normal limits  CBC WITH DIFFERENTIAL - Abnormal; Notable for the following:    WBC 15.4 (*)     RBC 3.20 (*)     Hemoglobin 8.4 (*)     HCT 27.6 (*)     RDW 18.3 (*)     Neutrophils Relative 85 (*)     Neutro Abs 13.1 (*)     Lymphocytes Relative 10 (*)     All other components within normal limits  COMPREHENSIVE METABOLIC PANEL - Abnormal; Notable for the following:    BUN 34 (*)     Creatinine, Ser 4.39 (*)     Albumin 2.4 (*)     Total Bilirubin 0.1 (*)     GFR calc non Af Amer 9 (*)     GFR calc Af Amer 10 (*)     All other components within normal limits  URINALYSIS, ROUTINE W REFLEX MICROSCOPIC - Abnormal; Notable for the following:    APPearance TURBID (*)     Hgb urine dipstick MODERATE (*)  Protein, ur >300 (*)     Leukocytes, UA LARGE (*)     All other components within normal limits  URINE MICROSCOPIC-ADD ON - Abnormal; Notable for the following:    Bacteria, UA FEW (*)     All other components within normal limits  MRSA PCR SCREENING - Abnormal; Notable for the following:     MRSA by PCR POSITIVE (*)     All other components within normal limits  TROPONIN I  GLUCOSE, CAPILLARY  GLUCOSE, CAPILLARY  HEMOGLOBIN A1C  CBC  URINE CULTURE  LIPID PANEL  RENAL FUNCTION PANEL   Ct Head Wo Contrast  08/02/2012  *RADIOLOGY REPORT*  Clinical Data: Altered mental status.  Possible TIA.  CT HEAD WITHOUT CONTRAST  Technique:  Contiguous axial images were obtained from the base of the skull through the vertex without contrast.  Comparison: 10/28/2011  Findings: Infarct noted within the right parietal lobe.  This is new since prior study but is likely subacute to chronic.  Diffuse cerebral atrophy.  Mild chronic small vessel disease changes.  No hydrocephalus.  No hemorrhage.  No mass lesion.  Extensive dural calcifications along the falx.  No acute calvarial abnormality. Mastoids are clear.  IMPRESSION: Infarct within the right parietal lobe which I suspect is subacute to chronic.  Atrophy, chronic small vessel disease.   Original Report Authenticated By: Charlett Nose, M.D.    Dg Chest Portable 1 View  08/02/2012  *RADIOLOGY REPORT*  Clinical Data: Facial droop.  PORTABLE CHEST - 1 VIEW  Comparison: 03/06/2012  Findings: Heart is upper limits normal in size.  No confluent airspace opacities or effusions.  Mild peribronchial thickening. No acute bony abnormality.  IMPRESSION: Stable mild peribronchial thickening.   Original Report Authenticated By: Charlett Nose, M.D.      Date: 08/02/2012  Rate: 107  Rhythm: sinus tachycardia  QRS Axis: normal  Intervals: normal  ST/T Wave abnormalities: nonspecific ST changes  Conduction Disutrbances:none  Narrative Interpretation:   Old EKG Reviewed: unchanged   1. Failure to thrive   2. Altered mental status   3. TIA (transient ischemic attack)   4. Diabetes mellitus, type 2   5. ESRD (end stage renal disease)   6. Sacral decubitus ulcer       MDM   Patient sent from the nursing home for possible left-sided facial droop that has  resolved since arrival here. She was last seen normal at 2:15. Patient is alert however is extremely difficult to understand her speech. Will speak with the nursing home for further information. Based on the records that were sent patient recently was admitted to [facility] due to pyelonephritis and had been on tobramycin and vanc through her dialysis. Today she is mildly tachycardic but denies having dialysis today.  She has a blood sugar of 66 and she does state that she ate today.  Blood pressure is within normal limits and patient has a wet cough on exam. She denies shortness of breath or abdominal pain.  Will do a TIA workup until further information is given by the nursing home  3:42 PM After speaking with nursing home sounds as if patient may have had a syncopal episode versus TIA. CBC, BMP, UA, head CT, chest x-ray, EKG, troponin pending. Patient given D50 which her blood sugar is most likely low due to failure to thrive and poor oral intake.   12:07 AM UA with signs of persistent infection which is most likely the cause of leukocytosis and possible AMS.  Will admit for further care.       Gwyneth Sprout, MD 08/03/12 [phone]

## 2012-08-02 NOTE — ED Notes (Signed)
Doctor Oti at bedside.

## 2012-08-02 NOTE — H&P (Signed)
PCP:   Colon Branch, MD   Chief Complaint:  Transient unresponsiveness.   HPI: This is a 79 year old female nursing facility resident, with known history of ESRD on HD T-T-S, chronic anemia, DM, dyslipidemia, secondary hyperparathyroidism, HTN, PVD, s/p bilateral BKA, GERD, sacral decubitus s/p recent debridement,s/p recent hospitalization at [facility], for pyelonephritis, treated with Tobramycin, then discharged on Vancomycin with HD. Apparently, patient is s/p HD today, which she completed without incident, then en route to the nursing facility, she had an episode of unresponsiveness, associated with facial drooping, lasting for about 20 mins, before patient arrived at the nursing facility. She was then brought to the ED. Per ED MD, patient on arrival, was asymptomatic and at baseline mental status.   Allergies:   Allergies  Allergen Reactions  . Percocet (Oxycodone-Acetaminophen)       Past Medical History  Diagnosis Date  . Syncope   . End stage kidney disease   . Hyperlipidemia   . Anemia   . Hypertension   . 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 Diabetes mellitus   . GERD (gastroesophageal reflux disease)   . Secondary hyperparathyroidism   . PVD (peripheral vascular disease)     History reviewed. No pertinent past surgical history.  Prior to Admission medications   Medication Sig Start Date End Date Taking? Authorizing Provider  amLODipine (NORVASC) 10 MG tablet Take 10 mg by mouth daily.   Yes Historical Provider, MD  aspirin 81 MG tablet Take 81 mg by mouth daily.   Yes Historical Provider, MD  b complex-vitamin c-folic acid (NEPHRO-VITE) 0.8 MG TABS Take 0.8 mg by mouth daily.    Yes Historical Provider, MD  bisacodyl (DULCOLAX) 10 MG suppository Place 10 mg rectally daily.   Yes Historical Provider, MD  cloNIDine (CATAPRES - DOSED IN MG/24 HR) 0.3 mg/24hr Place 1 patch onto the skin once a week. On Mondays   Yes  Historical Provider, MD  diltiazem (CARDIZEM CD) 240 MG 24 hr capsule Take 240 mg by mouth daily.   Yes Historical Provider, MD  HYDROcodone-acetaminophen (NORCO/VICODIN) 5-325 MG per tablet Take 1 tablet by mouth 3 (three) times daily as needed. For pain   Yes Historical Provider, MD  insulin detemir (LEVEMIR) 100 UNIT/ML injection Inject 10 Units into the skin at bedtime.   Yes Historical Provider, MD  insulin lispro (HUMALOG) 100 UNIT/ML injection Inject 5 Units into the skin See admin instructions. on Tues, Thurs and Sat.  (dialysis days) patient gets 5 units at 1430.  On all other days patient gets 5 units at 1130 before lunch.   Yes Historical Provider, MD  iron polysaccharides (NIFEREX) 150 MG capsule Take 150 mg by mouth daily.   Yes Historical Provider, MD  mirtazapine (REMERON) 15 MG tablet Take 15 mg by mouth at bedtime.   Yes Historical Provider, MD  ondansetron (ZOFRAN) 4 MG tablet Take 4 mg by mouth every 12 (twelve) hours as needed. For nausea/vomiting   Yes Historical Provider, MD  ondansetron (ZOFRAN) 4 MG tablet Take 4 mg by mouth 3 (three) times a week. Tues Thurs Sat (Dialysis days)   Yes Historical Provider, MD  PARoxetine (PAXIL) 20 MG tablet Take 20 mg by mouth every morning.   Yes Historical Provider, MD  polyethylene glycol (MIRALAX / GLYCOLAX) packet Take 17 g by mouth 2 (two) times daily.   Yes Historical Provider, MD  PRESCRIPTION MEDICATION Apply 1 mL topically daily as needed. Ativan 1mg /ml  topical gel: For agitation after dialysis   Yes Historical Provider, MD  ranitidine (ZANTAC) 150 MG tablet Take 150 mg by mouth at bedtime.   Yes Historical Provider, MD  risperiDONE (RISPERDAL) 0.5 MG tablet Take 0.5 mg by mouth 2 (two) times daily.   Yes Historical Provider, MD  simvastatin (ZOCOR) 10 MG tablet Take 10 mg by mouth daily.    Yes Historical Provider, MD  vitamin C (ASCORBIC ACID) 500 MG tablet Take 500 mg by mouth 3 (three) times daily.   Yes Historical Provider, MD    WHEY PROTEIN PO Take 35 mLs by mouth 2 (two) times daily. Mixed in vanilla pudding   Yes Historical Provider, MD  zinc sulfate (ZINC-220) 220 MG capsule Take 220 mg by mouth daily.   Yes Historical Provider, MD    Social History: Per EMR, patient has never smoked. She has never used smokeless tobacco. She reports that she does not drink alcohol or use illicit drugs.  Family History:  Unobtainable at this time, due to altered mental status.   Review of Systems:  As per HPI and chief complaint. Otherwise, unobtainable. There is however, no report from nursing facility,of antecedent respiratory tract illness, fever, vomiting or diarrhea. Patient reportedly has not been eating well and a PEG tube was recommended, but it was felt that patient was not "strong enough" to go through the procedure   Physical Exam:  General:  Patient is mumbling incoherently, but is alert and following simple commands. In response to question "Are you hurting anywhere?", she clearly answers "No". Not short of breath at rest.  HEENT:  Moderate clinical pallor, no jaundice, no conjunctival injection or discharge. NECK:  Supple, JVP not seen, no carotid bruits, no palpable lymphadenopathy, no palpable goiter. CHEST:  Clinically clear to auscultation, no wheezes, no crackles. HEART:  Sounds 1 and 2 heard, normal, regular, mildly tachycardic, no murmurs. ABDOMEN:  Full, soft, non-tender, no palpable organomegaly, no palpable masses, normal bowel sounds. GENITALIA:  Not examined. LOWER EXTREMITIES:  Unremarkable bilateral BKA stumps. MUSCULOSKELETAL SYSTEM:  Bilateral BKA, otherwise, generalized osteoarthritic changes, otherwise, no. CENTRAL NERVOUS SYSTEM:  There is currently no facial asymmetry, but patient has weakness of left arm grip.  Labs on Admission:  Results for orders placed during the hospital encounter of 08/02/12 (from the past 48 hour(s))  GLUCOSE, CAPILLARY     Status: Abnormal   Collection Time    08/02/12  3:25 PM      Component Value Range Comment   Glucose-Capillary 66 (*) 70 - 99 mg/dL    Comment 1 Documented in Chart      Comment 2 Notify RN     CBC WITH DIFFERENTIAL     Status: Abnormal   Collection Time   08/02/12  4:51 PM      Component Value Range Comment   WBC 15.4 (*) 4.0 - 10.5 K/uL    RBC 3.20 (*) 3.87 - 5.11 MIL/uL    Hemoglobin 8.4 (*) 12.0 - 15.0 g/dL    HCT 16.1 (*) 09.6 - 46.0 %    MCV 86.3  78.0 - 100.0 fL    MCH 26.3  26.0 - 34.0 pg    MCHC 30.4  30.0 - 36.0 g/dL    RDW 04.5 (*) 40.9 - 15.5 %    Platelets 280  150 - 400 K/uL    Neutrophils Relative 85 (*) 43 - 77 %    Neutro Abs 13.1 (*) 1.7 - 7.7 K/uL  Lymphocytes Relative 10 (*) 12 - 46 %    Lymphs Abs 1.5  0.7 - 4.0 K/uL    Monocytes Relative 5  3 - 12 %    Monocytes Absolute 0.7  0.1 - 1.0 K/uL    Eosinophils Relative 0  0 - 5 %    Eosinophils Absolute 0.0  0.0 - 0.7 K/uL    Basophils Relative 0  0 - 1 %    Basophils Absolute 0.0  0.0 - 0.1 K/uL   COMPREHENSIVE METABOLIC PANEL     Status: Abnormal   Collection Time   08/02/12  4:51 PM      Component Value Range Comment   Sodium 137  135 - 145 mEq/L    Potassium 3.8  3.5 - 5.1 mEq/L    Chloride 96  96 - 112 mEq/L    CO2 26  19 - 32 mEq/L    Glucose, Bld 81  70 - 99 mg/dL    BUN 34 (*) 6 - 23 mg/dL    Creatinine, Ser 4.01 (*) 0.50 - 1.10 mg/dL    Calcium 8.6  8.4 - 02.7 mg/dL    Total Protein 7.2  6.0 - 8.3 g/dL    Albumin 2.4 (*) 3.5 - 5.2 g/dL    AST 14  0 - 37 U/L    ALT <5  0 - 35 U/L    Alkaline Phosphatase 106  39 - 117 U/L    Total Bilirubin 0.1 (*) 0.3 - 1.2 mg/dL    GFR calc non Af Amer 9 (*) >90 mL/min    GFR calc Af Amer 10 (*) >90 mL/min   TROPONIN I     Status: Normal   Collection Time   08/02/12  4:52 PM      Component Value Range Comment   Troponin I <0.30  <0.30 ng/mL   GLUCOSE, CAPILLARY     Status: Normal   Collection Time   08/02/12  5:38 PM      Component Value Range Comment   Glucose-Capillary 80  70 - 99 mg/dL       Radiological Exams on Admission: *RADIOLOGY REPORT*  Clinical Data: Altered mental status. Possible TIA.  CT HEAD WITHOUT CONTRAST  Technique: Contiguous axial images were obtained from the base of  the skull through the vertex without contrast.  Comparison: 10/28/2011  Findings: Infarct noted within the right parietal lobe. This is  new since prior study but is likely subacute to chronic. Diffuse  cerebral atrophy. Mild chronic small vessel disease changes. No  hydrocephalus. No hemorrhage. No mass lesion. Extensive dural  calcifications along the falx. No acute calvarial abnormality.  Mastoids are clear.  IMPRESSION:  Infarct within the right parietal lobe which I suspect is subacute  to chronic.  Atrophy, chronic small vessel disease.  Original Report Authenticated By: Charlett Nose, M.D.   *RADIOLOGY REPORT*  Clinical Data: Facial droop.  PORTABLE CHEST - 1 VIEW  Comparison: 03/06/2012  Findings: Heart is upper limits normal in size. No confluent  airspace opacities or effusions. Mild peribronchial thickening.  No acute bony abnormality.  IMPRESSION:  Stable mild peribronchial thickening.  Original Report Authenticated By: Charlett Nose, M.D.    Assessment/Plan Active Problems:    1. Altered mental status: Patient presented with what was described as an episode of very poor responsiveness, associated with facial droop, following HD. Clinically, she does have some weakness of left upper extremity. Head CT scan revealed an infarct within the right parietal lobe  which may be subacute to chronic. Patient reportedly, was last seen normal at 2:15 PM today, so she is outside the window for t-PA, and symptoms had improved on arrival, although her other co-morbidities, including a moderate anemia, may also have been contraindications. She will be admitted to telemetry/neurology floor, and appropriate work up carried out, including brain MRI/MRA, carotid duplex, and 2D  echocardiogram. Anti-platelet medication will be continued, we shall monitor telemetrically, although EKG shows SR, with old Q-waves in V1-V2, but no acute ischemic changes. Bedside swallow evaluation will be done per protocol.   2. ESRD (end stage renal disease): Patient is on HD T-T-S, and is s/p hemodialysis today. We shall consult renal team to reinstate regular dialysis schedule, if indicated.  3 Diabetes mellitus, type 2: Patient has insulin-requiring type 2 diabetes mellitus. CBG was 66 on presentation, but came up to 80, while in the ED. As patient is clearly at high risk of hypoglycemia, given ESRD, insulin therapy and NPO status, will place on dextrose infusion, albeit at low rate of 40cc/hr, to avoid fluid overload, and do frequent CBGs. All insulin will be placed on hold for now.  4. HTN (hypertension): BP is currently ranging between 154/76-165/62. This is reasonable in the context of possible CVA. Will avoid aggressive BP control, until brain MRI results are obtained.  5. Anemia: This moderate, normocytic and due to chronic disease. Will follow CBC. 6. Sacral decubitus ulcer: Patient has a rather larg, approximately 8 cm x 9 cm stage 4 sacral decubitus, and is reportedly, s/p recent debridement. Wound shows evidence of granulation, and does not appear overly infected. Will consult WOC, and utilize mattress overlay. Meanwhile, local care with wet-to-dry dressings.  7. Recent pyelonephritis: Patient is reportedly, s/p recent hospitalization at [facility], for a pyelonephritis, for which shes was treated with Tobramycin, then Vancomycin. She is afebrile, and does not look toxic. We shall repeat urinalysis/Culture.  8. FTT: It appears that patient has had FTT and poor oral intake, and that the question of possible PEG was discussed at SNF. Perhaps, this will need to be addressed, during this hospitalization.   Further management will depend on clinical course.   Comment: Patient is FULL  CODE.   Time Spent on Admission: 1 hour.   Alannie Amodio,CHRISTOPHER 08/02/2012, 6:20 PM

## 2012-08-02 NOTE — ED Notes (Signed)
IV team paged.  

## 2012-08-02 NOTE — ED Notes (Signed)
IV team responded  

## 2012-08-02 NOTE — ED Notes (Signed)
Patient from nursing home who called EMS for patient having facial droop. Last seen normal 1415. EMS stated facial droop resolved and alert to her normal.

## 2012-08-02 NOTE — ED Notes (Signed)
IV team at bedside 

## 2012-08-03 ENCOUNTER — Inpatient Hospital Stay (HOSPITAL_COMMUNITY): Payer: Medicare Other

## 2012-08-03 DIAGNOSIS — I6789 Other cerebrovascular disease: Secondary | ICD-10-CM

## 2012-08-03 DIAGNOSIS — I6350 Cerebral infarction due to unspecified occlusion or stenosis of unspecified cerebral artery: Secondary | ICD-10-CM

## 2012-08-03 DIAGNOSIS — R627 Adult failure to thrive: Secondary | ICD-10-CM

## 2012-08-03 LAB — CBC
MCH: 26.3 pg (ref 26.0–34.0)
MCV: 86.2 fL (ref 78.0–100.0)
Platelets: 229 10*3/uL (ref 150–400)
RBC: 2.97 MIL/uL — ABNORMAL LOW (ref 3.87–5.11)
RDW: 18.4 % — ABNORMAL HIGH (ref 11.5–15.5)

## 2012-08-03 LAB — RENAL FUNCTION PANEL
BUN: 37 mg/dL — ABNORMAL HIGH (ref 6–23)
CO2: 26 mEq/L (ref 19–32)
Chloride: 100 mEq/L (ref 96–112)
GFR calc Af Amer: 9 mL/min — ABNORMAL LOW (ref >90)
Glucose, Bld: 117 mg/dL — ABNORMAL HIGH (ref 70–99)
Phosphorus: 4.2 mg/dL (ref 2.3–4.6)
Potassium: 3.9 mEq/L (ref 3.5–5.1)
Sodium: 138 mEq/L (ref 135–145)

## 2012-08-03 LAB — GLUCOSE, CAPILLARY
Glucose-Capillary: 108 mg/dL — ABNORMAL HIGH (ref 70–99)
Glucose-Capillary: 92 mg/dL (ref 70–99)
Glucose-Capillary: 97 mg/dL (ref 70–99)

## 2012-08-03 LAB — LIPID PANEL: Cholesterol: 109 mg/dL (ref 0–200)

## 2012-08-03 LAB — HEMOGLOBIN A1C: Hgb A1c MFr Bld: 6.5 % — ABNORMAL HIGH (ref <5.7)

## 2012-08-03 MED ORDER — MUPIROCIN 2 % EX OINT
1.0000 "application " | TOPICAL_OINTMENT | Freq: Two times a day (BID) | CUTANEOUS | Status: AC
  Administered 2012-08-03 – 2012-08-07 (×9): 1 via NASAL
  Filled 2012-08-03: qty 22

## 2012-08-03 MED ORDER — DOXERCALCIFEROL 4 MCG/2ML IV SOLN
0.5000 ug | INTRAVENOUS | Status: DC
  Administered 2012-08-08 – 2012-08-16 (×4): 0.5 ug via INTRAVENOUS
  Filled 2012-08-03 (×6): qty 2

## 2012-08-03 MED ORDER — CHLORHEXIDINE GLUCONATE CLOTH 2 % EX PADS
6.0000 | MEDICATED_PAD | Freq: Every day | CUTANEOUS | Status: AC
  Administered 2012-08-04 – 2012-08-07 (×4): 6 via TOPICAL

## 2012-08-03 MED ORDER — LORAZEPAM 2 MG/ML IJ SOLN
INTRAMUSCULAR | Status: AC
  Administered 2012-08-03: 1 mg
  Filled 2012-08-03: qty 1

## 2012-08-03 MED ORDER — LORAZEPAM 2 MG/ML IJ SOLN
1.0000 mg | Freq: Once | INTRAMUSCULAR | Status: AC
  Administered 2012-08-03: 1 mg via INTRAVENOUS
  Filled 2012-08-03 (×2): qty 1

## 2012-08-03 MED ORDER — DARBEPOETIN ALFA-POLYSORBATE 60 MCG/0.3ML IJ SOLN
60.0000 ug | INTRAMUSCULAR | Status: DC
  Filled 2012-08-03: qty 0.3

## 2012-08-03 NOTE — Progress Notes (Signed)
Utilization Review Completed.Linda [name] T11/01/2013  

## 2012-08-03 NOTE — Progress Notes (Signed)
[facility] PROGRESS NOTE  Sung Renton VZD:[id number] DOB: January 17, 1931 DOA: 08/02/2012 PCP: Colon Branch, MD  Assessment/Plan: Active Problems:  Altered mental status  Syncope  ESRD (end stage renal disease)  Diabetes mellitus, type 2  HTN (hypertension)  Anemia  Sacral decubitus ulcer  FTT (failure to thrive) in adult    1. Altered mental status: Patient presented with what was described as an episode of very poor responsiveness, associated with facial droop, following HD. Clinically, she does have some weakness of left upper extremity. Head CT scan revealed an infarct within the right parietal lobe which may be subacute to chronic. Patient reportedly, was last seen normal at 2:15 PM on 08/02/12, so she was outside the window for t-PA, and symptoms had improved on arrival, although her other co-morbidities, including a moderate anemia, may also have been contraindications. Dr Wyatt Portela provided neurology consultation and CVA w/u, including brain MRI/MRA, carotid duplex, and 2D echocardiogram are in progress. Continued on anti-platelet medication. EKG shows SR, with old Q-waves in V1-V2, but no acute ischemic changes. Swallow evaluation will be done per protocol.  2. ESRD (end stage renal disease): Patient is on HD T-T-S, and is s/p hemodialysis on 08/02/12. Dr Delano Metz provided nephrology consultation and regular dialysis schedule has been reinstated.  3 Diabetes mellitus, type 2: Patient has insulin-requiring type 2 diabetes mellitus. CBG was 66 on presentation, but came up to 80, while in the ED. As patient is clearly at high risk of hypoglycemia, given ESRD, insulin therapy and NPO status, she has been placed on dextrose infusion, albeit at low rate of 40cc/hr, to avoid fluid overload. All insulin is on hold, and CBGs have remained satisfactory. 4. HTN (hypertension): BP is currently ranging between 154/76-165/62. This is reasonable in the context of possible CVA. Will  avoid aggressive BP control, until brain MRI results are obtained.  5. Anemia: This moderate, normocytic and due to chronic disease. Following CBC.  6. Sacral decubitus ulcer: Patient has a rather larg, approximately 8 cm x 9 cm stage 4 sacral decubitus, and is reportedly, s/p recent debridement. Wound shows evidence of granulation, and does not appear overly infected. Utilizing mattress overlay. Meanwhile, local care with wet-to-dry dressings. Have consulted WOC fior further recommendations.  7. Recent pyelonephritis: Patient is reportedly, s/p recent hospitalization at [facility], for a pyelonephritis, for which shes was treated with Tobramycin, then Vancomycin. She is afebrile, and does not look toxic. Repeat urinalysis shows pyuria. Culture is pending.  8. FTT: It appears that patient has had FTT and poor oral intake, and that the question of possible PEG was discussed at SNF. Perhaps, this will need to be addressed, during this hospitalization.    Code Status: Full Code.  Family Communication: Discussed in detail with daughter at bedside.  Disposition Plan: To be determined.    Brief narrative: This is a 79 year old female nursing facility resident, with known history of ESRD on HD T-T-S, chronic anemia, DM, dyslipidemia, secondary hyperparathyroidism, HTN, PVD, s/p bilateral BKA, GERD, sacral decubitus s/p recent debridement,s/p recent hospitalization at [facility], for pyelonephritis, treated with Tobramycin, then discharged on Vancomycin with HD. Apparently, patient is s/p HD on 08/02/12, which she completed without incident, then en route to the nursing facility, she had an episode of unresponsiveness, associated with facial drooping, lasting for about 20 mins, before patient arrived at the nursing facility. She was then brought to the ED. Per ED MD, patient on arrival, was asymptomatic and at baseline mental status. She  was admitted for further evaluation and management.     Consultants:  Dr Wyatt Portela, neurologist.  Dr Delano Metz, nephrologist.   Procedures:  Head CT scan.   CXR.   Antibiotics:  N/A.   HPI/Subjective: Moaning occasionally, alert, no new issues.   Objective: Vital signs in last 24 hours: Temp:  [97.3 F (36.3 C)-98.6 F (37 C)] 97.9 F (36.6 C) (01/17 1010) Pulse Rate:  [87-107] 100  (01/17 1010) Resp:  [16-20] 18  (01/17 1010) BP: (147-178)/(61-93) 163/74 mmHg (01/17 1010) SpO2:  [99 %-100 %] 100 % (01/17 1010) Weight:  [51.8 kg (114 lb 3.2 oz)] 51.8 kg (114 lb 3.2 oz) (01/16 2136) Weight change:     Intake/Output from previous day: 01/16 0701 - 01/17 0700 In: -  Out: 30 [Urine:30]     Physical Exam: General: Patient is mumbling incoherently, but is alert and following simple commands. Not short of breath at rest.  HEENT: Moderate clinical pallor, no jaundice, no conjunctival injection or discharge.  NECK: Supple, JVP not seen, no carotid bruits, no palpable lymphadenopathy, no palpable goiter.  CHEST: Clinically clear to auscultation, no wheezes, no crackles.  HEART: Sounds 1 and 2 heard, normal, regular, mildly tachycardic, no murmurs.  ABDOMEN: Full, soft, non-tender, no palpable organomegaly, no palpable masses, normal bowel sounds.  GENITALIA: Not examined.  LOWER EXTREMITIES: Unremarkable bilateral BKA stumps.  MUSCULOSKELETAL SYSTEM: Bilateral BKA, otherwise, generalized osteoarthritic changes, otherwise, no.  CENTRAL NERVOUS SYSTEM: There is currently no facial asymmetry, but patient has weakness of left arm grip.  Lab Results:  [facility] 08/03/12 0650 08/02/12 1651  WBC 12.3* 15.4*  HGB 7.8* 8.4*  HCT 25.6* 27.6*  PLT 229 280    Basename 08/03/12 0650 08/02/12 1651  NA 138 137  K 3.9 3.8  CL 100 96  CO2 26 26  GLUCOSE 117* 81  BUN 37* 34*  CREATININE 4.92* 4.39*  CALCIUM 8.3* 8.6   Recent Results (from the past 240 hour(s))  MRSA PCR SCREENING     Status: Abnormal    Collection Time   08/02/12  9:45 PM      Component Value Range Status Comment   MRSA by PCR POSITIVE (*) NEGATIVE Final      Studies/Results: Ct Head Wo Contrast  08/02/2012  *RADIOLOGY REPORT*  Clinical Data: Altered mental status.  Possible TIA.  CT HEAD WITHOUT CONTRAST  Technique:  Contiguous axial images were obtained from the base of the skull through the vertex without contrast.  Comparison: 10/28/2011  Findings: Infarct noted within the right parietal lobe.  This is new since prior study but is likely subacute to chronic.  Diffuse cerebral atrophy.  Mild chronic small vessel disease changes.  No hydrocephalus.  No hemorrhage.  No mass lesion.  Extensive dural calcifications along the falx.  No acute calvarial abnormality. Mastoids are clear.  IMPRESSION: Infarct within the right parietal lobe which I suspect is subacute to chronic.  Atrophy, chronic small vessel disease.   Original Report Authenticated By: Charlett Nose, M.D.    Dg Chest Portable 1 View  08/02/2012  *RADIOLOGY REPORT*  Clinical Data: Facial droop.  PORTABLE CHEST - 1 VIEW  Comparison: 03/06/2012  Findings: Heart is upper limits normal in size.  No confluent airspace opacities or effusions.  Mild peribronchial thickening. No acute bony abnormality.  IMPRESSION: Stable mild peribronchial thickening.   Original Report Authenticated By: Charlett Nose, M.D.     Medications: Scheduled Meds:   . aspirin  300 mg Rectal Daily  Or  . aspirin  325 mg Oral Daily  . Chlorhexidine Gluconate Cloth  6 each Topical Q0600  . darbepoetin (ARANESP) injection - DIALYSIS  60 mcg Intravenous Q Sat-HD  . doxercalciferol  0.5 mcg Intravenous Q T,Th,Sa-HD  . heparin  5,000 Units Subcutaneous Q8H  . LORazepam  1 mg Intravenous Once  . mupirocin ointment  1 application Nasal BID  . sodium chloride  3 mL Intravenous Q12H   Continuous Infusions:   . dextrose 5 % and 0.9% NaCl 40 mL/hr at 08/02/12 2021   PRN Meds:.sodium chloride,  acetaminophen, acetaminophen, ondansetron (ZOFRAN) IV, sodium chloride    LOS: 1 day   Gibson Lad,CHRISTOPHER  [facility] Pager [phone]. If 8PM-8AM, please contact night-coverage at [email or web address], password [facility] 08/03/2012, 5:24 PM  LOS: 1 day

## 2012-08-03 NOTE — Progress Notes (Signed)
INITIAL NUTRITION ASSESSMENT  DOCUMENTATION CODES Per approved criteria  -Not Applicable   INTERVENTION:  Advance diet as medically appropriate RD to follow for nutrition care plan, add interventions accordingly  NUTRITION DIAGNOSIS: Increased nutrient needs related to wound healing as evidenced by estimated nutrition needs  Goal: Oral intake vs EN support to meet >/= 90% of estimated nutrition needs  Monitor:  PO diet advancement & intake, weight, labs, I/O's  Reason for Assessment: Low Braden  79 y.o. female  Admitting Dx: Transient unresponsiveness  ASSESSMENT:  Patient is a nursing facility resident s/p recent hospitalization at [facility] for pyelonephritis ---> discharged on Vancomycin with HD; en route back to nursing facility had an episode of unresponsiveness, associated with facial drooping ---> acute versus subacute CVA.  Patient down in MRI ---> RD unable to obtain nutrition hx or physically exam; per H&P "patient has not been eating well and PEG tube was recommended"; swallow evaluation cancelled today due to limited ability to participate; CWOCN note reviewed 1/17 ---> at risk for further skin breakdown with low braden score.  RD suspects some level of malnutrition, however, unable to diagnosis at this time.  Height: Ht Readings from Last 1 Encounters:  09/14/11 5\' 4"  (1.626 m)    Weight: Wt Readings from Last 1 Encounters:  08/02/12 114 lb 3.2 oz (51.8 kg)    Ideal Body Weight: 105 lb ---> adjusted for amputations  % Ideal Body Weight: 108%  Wt Readings from Last 10 Encounters:  08/02/12 114 lb 3.2 oz (51.8 kg)  09/14/11 136 lb 11 oz (62 kg)  08/15/11 180 lb (81.647 kg)    Usual Body Weight: 136 lb  % Usual Body Weight: 88%  BMI:  22.0 kg/m2 ---> adjusted for amputations  Estimated Nutritional Needs: Kcal: 1700-1900 Protein: 80-90 gm Fluid: 1.7-1.9 L  Skin: Stage IV sacral pressure ulcer  Diet Order: NPO  EDUCATION NEEDS: -No  education needs identified at this time   Intake/Output Summary (Last 24 hours) at 08/03/12 1422 Last data filed at 08/02/12 1830  Gross per 24 hour  Intake      0 ml  Output     30 ml  Net    -30 ml    Labs:   Lab 08/03/12 0650 08/02/12 1651  NA 138 137  K 3.9 3.8  CL 100 96  CO2 26 26  BUN 37* 34*  CREATININE 4.92* 4.39*  CALCIUM 8.3* 8.6  MG -- --  PHOS 4.2 --  GLUCOSE 117* 81    CBG (last 3)   Basename 08/03/12 1216 08/03/12 0709 08/03/12 0414  GLUCAP 108* 106* 102*    Scheduled Meds:   . aspirin  300 mg Rectal Daily   Or  . aspirin  325 mg Oral Daily  . Chlorhexidine Gluconate Cloth  6 each Topical Q0600  . darbepoetin (ARANESP) injection - DIALYSIS  60 mcg Intravenous Q Sat-HD  . doxercalciferol  0.5 mcg Intravenous Q T,Th,Sa-HD  . heparin  5,000 Units Subcutaneous Q8H  . LORazepam  1 mg Intravenous Once  . mupirocin ointment  1 application Nasal BID  . sodium chloride  3 mL Intravenous Q12H    Continuous Infusions:   . dextrose 5 % and 0.9% NaCl 40 mL/hr at 08/02/12 2021    Past Medical History  Diagnosis Date  . Syncope   . End stage kidney disease   . Hyperlipidemia   . Anemia   . Hypertension   . Bilateral traumatic amputation of legs at  any level without complication   . Diabetes mellitus   . GERD (gastroesophageal reflux disease)   . Secondary hyperparathyroidism   . PVD (peripheral vascular disease)     History reviewed. No pertinent past surgical history.  Maureen Chatters, RD, LDN Pager #: [phone] After-Hours Pager #: [phone]

## 2012-08-03 NOTE — Consult Note (Signed)
Reason for Consult: Continuity of ESRD care Referring Physician: Dr. Isidor Holts. [facility]   HPI:  79 year old African American woman who is a nursing home resident and with history of end-stage renal disease on a Tuesday/Thursday and Saturday dialysis schedule at the [facility] unit in [place] ([email or web address]). Was brought into the hospital with a change in her level of consciousness as well as facial droop attributed to likely acute CVA yesterday after dialysis. She has an extensive history of hypertension, peripheral vascular disease, diabetes mellitus, anemia and dementia. Recently was admitted to [facility] and managed for pyelonephritis with possible sepsis and also ongoing care of her large sacral decubitus.  Dialysis prescription: Tuesday/Thursday/Saturday, 3 hours, blood flow rate 350, dialysate flow rate 600, right upper arm arteriovenous graft, 15-gauge needles, 2K/2.5 calcium, estimated dry weight 51.5 kg, Epogen 5500 units 3 times a week, Hectorol 0.5 mcg 3 times a week and Venofer 50 mg every Thursday.  Past Medical History  Diagnosis Date  . Syncope   . End stage kidney disease   . Hyperlipidemia   . Anemia   . Hypertension   . 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 Diabetes mellitus   . GERD (gastroesophageal reflux disease)   . Secondary hyperparathyroidism   . PVD (peripheral vascular disease)     History reviewed. No pertinent past surgical history.  No family history on file.  Social History:  reports that she has never smoked. She has never used smokeless tobacco. She reports that she does not drink alcohol or use illicit drugs.  Allergies:  Allergies  Allergen Reactions  . Percocet (Oxycodone-Acetaminophen)     Medications:  Scheduled:   . aspirin  300 mg Rectal Daily   Or  . aspirin  325 mg Oral Daily  . Chlorhexidine Gluconate Cloth  6 each Topical Q0600  . heparin  5,000 Units Subcutaneous  Q8H  . mupirocin ointment  1 application Nasal BID  . sodium chloride  3 mL Intravenous Q12H    Results for orders placed during the hospital encounter of 08/02/12 (from the past 48 hour(s))  GLUCOSE, CAPILLARY     Status: Abnormal   Collection Time   08/02/12  3:25 PM      Component Value Range Comment   Glucose-Capillary 66 (*) 70 - 99 mg/dL    Comment 1 Documented in Chart      Comment 2 Notify RN     CBC WITH DIFFERENTIAL     Status: Abnormal   Collection Time   08/02/12  4:51 PM      Component Value Range Comment   WBC 15.4 (*) 4.0 - 10.5 K/uL    RBC 3.20 (*) 3.87 - 5.11 MIL/uL    Hemoglobin 8.4 (*) 12.0 - 15.0 g/dL    HCT 16.1 (*) 09.6 - 46.0 %    MCV 86.3  78.0 - 100.0 fL    MCH 26.3  26.0 - 34.0 pg    MCHC 30.4  30.0 - 36.0 g/dL    RDW 04.5 (*) 40.9 - 15.5 %    Platelets 280  150 - 400 K/uL    Neutrophils Relative 85 (*) 43 - 77 %    Neutro Abs 13.1 (*) 1.7 - 7.7 K/uL    Lymphocytes Relative 10 (*) 12 - 46 %    Lymphs Abs 1.5  0.7 - 4.0 K/uL    Monocytes Relative 5  3 - 12 %    Monocytes Absolute  0.7  0.1 - 1.0 K/uL    Eosinophils Relative 0  0 - 5 %    Eosinophils Absolute 0.0  0.0 - 0.7 K/uL    Basophils Relative 0  0 - 1 %    Basophils Absolute 0.0  0.0 - 0.1 K/uL   COMPREHENSIVE METABOLIC PANEL     Status: Abnormal   Collection Time   08/02/12  4:51 PM      Component Value Range Comment   Sodium 137  135 - 145 mEq/L    Potassium 3.8  3.5 - 5.1 mEq/L    Chloride 96  96 - 112 mEq/L    CO2 26  19 - 32 mEq/L    Glucose, Bld 81  70 - 99 mg/dL    BUN 34 (*) 6 - 23 mg/dL    Creatinine, Ser 4.54 (*) 0.50 - 1.10 mg/dL    Calcium 8.6  8.4 - 09.8 mg/dL    Total Protein 7.2  6.0 - 8.3 g/dL    Albumin 2.4 (*) 3.5 - 5.2 g/dL    AST 14  0 - 37 U/L    ALT <5  0 - 35 U/L    Alkaline Phosphatase 106  39 - 117 U/L    Total Bilirubin 0.1 (*) 0.3 - 1.2 mg/dL    GFR calc non Af Amer 9 (*) >90 mL/min    GFR calc Af Amer 10 (*) >90 mL/min   TROPONIN I     Status: Normal    Collection Time   08/02/12  4:52 PM      Component Value Range Comment   Troponin I <0.30  <0.30 ng/mL   GLUCOSE, CAPILLARY     Status: Normal   Collection Time   08/02/12  5:38 PM      Component Value Range Comment   Glucose-Capillary 80  70 - 99 mg/dL   URINALYSIS, ROUTINE W REFLEX MICROSCOPIC     Status: Abnormal   Collection Time   08/02/12  6:43 PM      Component Value Range Comment   Color, Urine YELLOW  YELLOW    APPearance TURBID (*) CLEAR    Specific Gravity, Urine 1.016  1.005 - 1.030    pH 7.5  5.0 - 8.0    Glucose, UA NEGATIVE  NEGATIVE mg/dL    Hgb urine dipstick MODERATE (*) NEGATIVE    Bilirubin Urine NEGATIVE  NEGATIVE    Ketones, ur NEGATIVE  NEGATIVE mg/dL    Protein, ur >119 (*) NEGATIVE mg/dL    Urobilinogen, UA 0.2  0.0 - 1.0 mg/dL    Nitrite NEGATIVE  NEGATIVE    Leukocytes, UA LARGE (*) NEGATIVE   URINE MICROSCOPIC-ADD ON     Status: Abnormal   Collection Time   08/02/12  6:43 PM      Component Value Range Comment   Squamous Epithelial / LPF RARE  RARE    WBC, UA TOO NUMEROUS TO COUNT  <3 WBC/hpf    RBC / HPF 7-10  <3 RBC/hpf    Bacteria, UA FEW (*) RARE   MRSA PCR SCREENING     Status: Abnormal   Collection Time   08/02/12  9:45 PM      Component Value Range Comment   MRSA by PCR POSITIVE (*) NEGATIVE   GLUCOSE, CAPILLARY     Status: Normal   Collection Time   08/02/12 10:34 PM      Component Value Range Comment   Glucose-Capillary 91  70 -  99 mg/dL    Comment 1 Documented in Chart      Comment 2 Notify RN     GLUCOSE, CAPILLARY     Status: Normal   Collection Time   08/03/12 12:42 AM      Component Value Range Comment   Glucose-Capillary 88  70 - 99 mg/dL    Comment 1 Documented in Chart      Comment 2 Notify RN     GLUCOSE, CAPILLARY     Status: Abnormal   Collection Time   08/03/12  2:47 AM      Component Value Range Comment   Glucose-Capillary 101 (*) 70 - 99 mg/dL   GLUCOSE, CAPILLARY     Status: Abnormal   Collection Time   08/03/12   4:14 AM      Component Value Range Comment   Glucose-Capillary 102 (*) 70 - 99 mg/dL    Comment 1 Documented in Chart      Comment 2 Notify RN     CBC     Status: Abnormal   Collection Time   08/03/12  6:50 AM      Component Value Range Comment   WBC 12.3 (*) 4.0 - 10.5 K/uL    RBC 2.97 (*) 3.87 - 5.11 MIL/uL    Hemoglobin 7.8 (*) 12.0 - 15.0 g/dL    HCT 40.9 (*) 81.1 - 46.0 %    MCV 86.2  78.0 - 100.0 fL    MCH 26.3  26.0 - 34.0 pg    MCHC 30.5  30.0 - 36.0 g/dL    RDW 91.4 (*) 78.2 - 15.5 %    Platelets 229  150 - 400 K/uL   LIPID PANEL     Status: Normal   Collection Time   08/03/12  6:50 AM      Component Value Range Comment   Cholesterol 109  0 - 200 mg/dL    Triglycerides 70  <956 mg/dL    HDL 42  >21 mg/dL    Total CHOL/HDL Ratio 2.6      VLDL 14  0 - 40 mg/dL    LDL Cholesterol 53  0 - 99 mg/dL   RENAL FUNCTION PANEL     Status: Abnormal   Collection Time   08/03/12  6:50 AM      Component Value Range Comment   Sodium 138  135 - 145 mEq/L    Potassium 3.9  3.5 - 5.1 mEq/L    Chloride 100  96 - 112 mEq/L    CO2 26  19 - 32 mEq/L    Glucose, Bld 117 (*) 70 - 99 mg/dL    BUN 37 (*) 6 - 23 mg/dL    Creatinine, Ser 3.08 (*) 0.50 - 1.10 mg/dL    Calcium 8.3 (*) 8.4 - 10.5 mg/dL    Phosphorus 4.2  2.3 - 4.6 mg/dL    Albumin 2.0 (*) 3.5 - 5.2 g/dL    GFR calc non Af Amer 8 (*) >90 mL/min    GFR calc Af Amer 9 (*) >90 mL/min   GLUCOSE, CAPILLARY     Status: Abnormal   Collection Time   08/03/12  7:09 AM      Component Value Range Comment   Glucose-Capillary 106 (*) 70 - 99 mg/dL    Comment 1 Notify RN      Comment 2 Documented in Chart       Ct Head Wo Contrast  08/02/2012  *RADIOLOGY REPORT*  Clinical  Data: Altered mental status.  Possible TIA.  CT HEAD WITHOUT CONTRAST  Technique:  Contiguous axial images were obtained from the base of the skull through the vertex without contrast.  Comparison: 10/28/2011  Findings: Infarct noted within the right parietal lobe.   This is new since prior study but is likely subacute to chronic.  Diffuse cerebral atrophy.  Mild chronic small vessel disease changes.  No hydrocephalus.  No hemorrhage.  No mass lesion.  Extensive dural calcifications along the falx.  No acute calvarial abnormality. Mastoids are clear.  IMPRESSION: Infarct within the right parietal lobe which I suspect is subacute to chronic.  Atrophy, chronic small vessel disease.   Original Report Authenticated By: Charlett Nose, M.D.    Dg Chest Portable 1 View  08/02/2012  *RADIOLOGY REPORT*  Clinical Data: Facial droop.  PORTABLE CHEST - 1 VIEW  Comparison: 03/06/2012  Findings: Heart is upper limits normal in size.  No confluent airspace opacities or effusions.  Mild peribronchial thickening. No acute bony abnormality.  IMPRESSION: Stable mild peribronchial thickening.   Original Report Authenticated By: Charlett Nose, M.D.     Review of Systems  Unable to perform ROS: patient nonverbal   Blood pressure 163/74, pulse 100, temperature 97.9 F (36.6 C), temperature source Oral, resp. rate 18, weight 51.8 kg (114 lb 3.2 oz), SpO2 100.00%. Physical Exam  Nursing note and vitals reviewed. Constitutional: No distress.       Poorly nourished and appears uncomfortable  HENT:  Head: Normocephalic and atraumatic.  Mouth/Throat: Oropharynx is clear and moist.  Eyes:       Not examined as the patient is poorly cooperative  Neck: Normal range of motion. Neck supple. No JVD present. No tracheal deviation present. No thyromegaly present.  Cardiovascular: Normal rate and regular rhythm.  Exam reveals no friction rub.   No murmur heard. Respiratory: Effort normal.       Coarse breath sounds bilaterally-appear transmitted. No rales/rhonchi.  GI: Soft. Bowel sounds are normal. She exhibits no distension. There is no tenderness. There is no rebound.  Musculoskeletal: She exhibits no edema.       Status post bilateral below-knee amputations. Both stump sites appear to be  well healed without any breakdown of skin.  Neurological:       Neurological status could not be examined.  Skin: Skin is warm and dry. She is not diaphoretic.       Large sacral decubitus noted-ongoing wound care    Assessment/Plan: 1. Acute versus subacute CVA-CT scan findings reviewed. Ongoing further workup/management per neurology recommendations. Overall prognosis unfortunate seems to be poor in this woman with a poor functional status/pre-existing dementia. 2. End-stage renal disease on hemodialysis: Continue hemodialysis on a Tuesday/Thursday/Saturday schedule for now as further workup reveals on goals/directions of care. 3. Chronic anemia-underlying ESRD as well as resistance to ESA from underlying wound/infection/ongoing blood loss. 4. Metabolic bone disease: Continue Hectorol for PTH management, currently n.p.o.-off phosphorus binders pending swallow evaluation. 5. Sacral decubitus: Ongoing aggressive wound care/debridement.  Taiya Nutting K. 08/03/2012, 11:02 AM

## 2012-08-03 NOTE — Progress Notes (Signed)
PT HYDROTHERAPY EVALUATION NOTE   08/03/12 1300  Subjective Assessment  Date of Onset (prior to admission)  Wound 08/02/12 Other (Comment) Back Mid;Lower  Date First Assessed/Time First Assessed: 08/02/12 1820   Wound Type: (c) Other (Comment)  Location: Back  Location Orientation: Mid;Lower  Present on Admission: Yes  Site / Wound Assessment Pink;Yellow;Black;Brown;Dusky;Granulation tissue;Red  % Wound base Red or Granulating 40%  % Wound base Yellow 35%  % Wound base Black 10%  % Wound base Other (Comment) 5% (adipose tissure)  Peri-wound Assessment Pink  Wound Length (cm) 14 cm  Wound Width (cm) 12 cm  Wound Depth (cm) 4 cm  Tunneling (cm) 4cm at approximately 6:00   Undermining (cm) 3.5cm around edges from 1:00 to 4:00  Margins Unattacted edges (unapproximated)  Closure None  Drainage Amount Moderate  Drainage Description Odor;Serosanguineous  Non-staged Wound Description Full thickness  Treatment Hydrotherapy (Pulse lavage);Debridement (Selective);Packing (Saline gauze)  Dressing Type ABD;Other (Comment);Barrier Film (skin prep);Gauze (Comment) (Damp kerlix covered with 4 dry 4x4s and ABD)  Dressing Changed New  Dressing Status Clean  Hydrotherapy  Pulsed Lavage with Suction (psi) 4 psi  Pulsed Lavage with Suction - Normal Saline Used 1000 mL  Pulsed Lavage Tip Tip with splash shield  Pulsed lavage therapy - wound location Sacral wound  Selective Debridement  Selective Debridement - Location sacral wound  Selective Debridement - Tools Used Forceps;Scalpel;Scissors  Selective Debridement - Tissue Removed Large portion of black eschar removed off outter margin, necrotic yellow and dusky tissue removed from wound bed and tunneled areas.   Wound Therapy - Assess/Plan/Recommendations  Wound Therapy - Clinical Statement Pt is a 79 yo female with previos hx of stroke and bilateral BKA. Patient has large stage IV sacral wound that appears to have had prior surgical debridement.  Would bed has moderate amounts of necrotic tissue as well as some portions of eschar.  Patient will benefit from continued hydrotherapy treatments to cleans and irrigate wound bed, debride necrotic tissues and dres wound to facilitate wound healing.Will continue to see and treat as indicated.  Wound Therapy - Functional Problem List decreased skin integrity, decreased functional mobility  Factors Delaying/Impairing Wound Healing Altered sensation;Diabetes Mellitus;Multiple medical problems;Vascular compromise;Infection - systemic/local  Hydrotherapy Plan Debridement;Patient/family education;Pulsatile lavage with suction  Wound Therapy - Frequency 6X / week  Wound Plan Will see 6x a week as indicated with PLS, selective debridement and dressing changes  Wound Therapy Goals - Improve the function of patient's integumentary system by progressing the wound(s) through the phases of wound healing by:  Decrease Necrotic Tissue to <20%  Decrease Necrotic Tissue - Progress Goal set today  Increase Granulation Tissue to >75%  Increase Granulation Tissue - Progress Goal set today  Improve Drainage Characteristics Min  Improve Drainage Characteristics - Progress Goal set today  Goals/treatment plan/discharge plan were made with and agreed upon by patient/family No, Patient unable to participate in goals/treatment/discharge plan and family unavailable  Time For Goal Achievement 7 days  Wound Therapy - Potential for Goals Fair   Charlotte Crumb, PT DPT  [phone]

## 2012-08-03 NOTE — Consult Note (Signed)
NEURO HOSPITALIST CONSULT NOTE    Reason for Consult: stroke  HPI:                                                                                                                                         HPI obtained from chart   Linda [name] is an 79 y.o. female patient who after HD today, after HD while en route to the nursing facility, she had an episode of unresponsiveness, associated with facial drooping, lasting for about 20 mins, before patient arrived at the nursing facility. She was then brought to the ED. Per ED MD, patient on arrival, was asymptomatic and at baseline mental status.  CT head showed a right parietal infarct. Neurology consulted for infarct. At time of consultation patient was nonverbal, followed no commands, clenched eyes tightly, moaned when UE were moved.    Past Medical History  Diagnosis Date  . Syncope   . End stage kidney disease   . Hyperlipidemia   . Anemia   . Hypertension   . 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 Diabetes mellitus   . GERD (gastroesophageal reflux disease)   . Secondary hyperparathyroidism   . PVD (peripheral vascular disease)     History reviewed. No pertinent past surgical history.  No family history on file.   Social History:  reports that she has never smoked. She has never used smokeless tobacco. She reports that she does not drink alcohol or use illicit drugs.  Allergies  Allergen Reactions  . Percocet (Oxycodone-Acetaminophen)     MEDICATIONS:                                                                                                                     Scheduled:   . aspirin  300 mg Rectal Daily   Or  . aspirin  325 mg Oral Daily  . Chlorhexidine Gluconate Cloth  6 each Topical Q0600  . darbepoetin (ARANESP) injection - DIALYSIS  60 mcg Intravenous Q Sat-HD  . doxercalciferol  0.5 mcg Intravenous Q T,Th,Sa-HD  . heparin  5,000 Units  Subcutaneous Q8H  . LORazepam  1 mg Intravenous Once  . mupirocin ointment  1 application Nasal BID  . sodium chloride  3 mL Intravenous Q12H  Continuous:   . dextrose 5 % and 0.9% NaCl 40 mL/hr at 08/02/12 2021     ROS:                                                                                                                                       History obtained from unobtainable from patient due to mental status   Blood pressure 163/74, pulse 100, temperature 97.9 F (36.6 C), temperature source Oral, resp. rate 18, weight 51.8 kg (114 lb 3.2 oz), SpO2 100.00%.   Neurologic Examination:                                                                                                      Mental Status: moans when touched. Will not obey commands Cranial Nerves: II: unable to examine as patient clenches eyes tightly shut.  III,IV, VI: as above V,VII: face symmetric VIII: will answer yes to her name being called IX,X: gag reflex present XI: unable to examine XII: unable to examine Motor: Right : Upper extremity   5/5    Left:     Upper extremity   5/5 --increased tone bilateral UE, left arm shows resting and postural tremor along with head showing titubation.   --bilateral BKA Sensory: withdraws bilateral UE Deep Tendon Reflexes: 2+ and symmetricUE       Lab Results  Component Value Date/Time   CHOL 109 08/03/2012  6:50 AM    Results for orders placed during the hospital encounter of 08/02/12 (from the past 48 hour(s))  GLUCOSE, CAPILLARY     Status: Abnormal   Collection Time   08/02/12  3:25 PM      Component Value Range Comment   Glucose-Capillary 66 (*) 70 - 99 mg/dL    Comment 1 Documented in Chart      Comment 2 Notify RN     CBC WITH DIFFERENTIAL     Status: Abnormal   Collection Time   08/02/12  4:51 PM      Component Value Range Comment   WBC 15.4 (*) 4.0 - 10.5 K/uL    RBC 3.20 (*) 3.87 - 5.11 MIL/uL    Hemoglobin 8.4 (*) 12.0 - 15.0 g/dL    HCT  16.1 (*) 09.6 - 46.0 %    MCV 86.3  78.0 - 100.0 fL    MCH 26.3  26.0 - 34.0 pg    MCHC 30.4  30.0 - 36.0 g/dL    RDW 04.5 (*) 40.9 - 15.5 %  Platelets 280  150 - 400 K/uL    Neutrophils Relative 85 (*) 43 - 77 %    Neutro Abs 13.1 (*) 1.7 - 7.7 K/uL    Lymphocytes Relative 10 (*) 12 - 46 %    Lymphs Abs 1.5  0.7 - 4.0 K/uL    Monocytes Relative 5  3 - 12 %    Monocytes Absolute 0.7  0.1 - 1.0 K/uL    Eosinophils Relative 0  0 - 5 %    Eosinophils Absolute 0.0  0.0 - 0.7 K/uL    Basophils Relative 0  0 - 1 %    Basophils Absolute 0.0  0.0 - 0.1 K/uL   COMPREHENSIVE METABOLIC PANEL     Status: Abnormal   Collection Time   08/02/12  4:51 PM      Component Value Range Comment   Sodium 137  135 - 145 mEq/L    Potassium 3.8  3.5 - 5.1 mEq/L    Chloride 96  96 - 112 mEq/L    CO2 26  19 - 32 mEq/L    Glucose, Bld 81  70 - 99 mg/dL    BUN 34 (*) 6 - 23 mg/dL    Creatinine, Ser 0.98 (*) 0.50 - 1.10 mg/dL    Calcium 8.6  8.4 - 11.9 mg/dL    Total Protein 7.2  6.0 - 8.3 g/dL    Albumin 2.4 (*) 3.5 - 5.2 g/dL    AST 14  0 - 37 U/L    ALT <5  0 - 35 U/L    Alkaline Phosphatase 106  39 - 117 U/L    Total Bilirubin 0.1 (*) 0.3 - 1.2 mg/dL    GFR calc non Af Amer 9 (*) >90 mL/min    GFR calc Af Amer 10 (*) >90 mL/min   TROPONIN I     Status: Normal   Collection Time   08/02/12  4:52 PM      Component Value Range Comment   Troponin I <0.30  <0.30 ng/mL   GLUCOSE, CAPILLARY     Status: Normal   Collection Time   08/02/12  5:38 PM      Component Value Range Comment   Glucose-Capillary 80  70 - 99 mg/dL   URINALYSIS, ROUTINE W REFLEX MICROSCOPIC     Status: Abnormal   Collection Time   08/02/12  6:43 PM      Component Value Range Comment   Color, Urine YELLOW  YELLOW    APPearance TURBID (*) CLEAR    Specific Gravity, Urine 1.016  1.005 - 1.030    pH 7.5  5.0 - 8.0    Glucose, UA NEGATIVE  NEGATIVE mg/dL    Hgb urine dipstick MODERATE (*) NEGATIVE    Bilirubin Urine NEGATIVE   NEGATIVE    Ketones, ur NEGATIVE  NEGATIVE mg/dL    Protein, ur >147 (*) NEGATIVE mg/dL    Urobilinogen, UA 0.2  0.0 - 1.0 mg/dL    Nitrite NEGATIVE  NEGATIVE    Leukocytes, UA LARGE (*) NEGATIVE   URINE MICROSCOPIC-ADD ON     Status: Abnormal   Collection Time   08/02/12  6:43 PM      Component Value Range Comment   Squamous Epithelial / LPF RARE  RARE    WBC, UA TOO NUMEROUS TO COUNT  <3 WBC/hpf    RBC / HPF 7-10  <3 RBC/hpf    Bacteria, UA FEW (*) RARE   MRSA PCR SCREENING  Status: Abnormal   Collection Time   08/02/12  9:45 PM      Component Value Range Comment   MRSA by PCR POSITIVE (*) NEGATIVE   GLUCOSE, CAPILLARY     Status: Normal   Collection Time   08/02/12 10:34 PM      Component Value Range Comment   Glucose-Capillary 91  70 - 99 mg/dL    Comment 1 Documented in Chart      Comment 2 Notify RN     GLUCOSE, CAPILLARY     Status: Normal   Collection Time   08/03/12 12:42 AM      Component Value Range Comment   Glucose-Capillary 88  70 - 99 mg/dL    Comment 1 Documented in Chart      Comment 2 Notify RN     GLUCOSE, CAPILLARY     Status: Abnormal   Collection Time   08/03/12  2:47 AM      Component Value Range Comment   Glucose-Capillary 101 (*) 70 - 99 mg/dL   GLUCOSE, CAPILLARY     Status: Abnormal   Collection Time   08/03/12  4:14 AM      Component Value Range Comment   Glucose-Capillary 102 (*) 70 - 99 mg/dL    Comment 1 Documented in Chart      Comment 2 Notify RN     HEMOGLOBIN A1C     Status: Abnormal   Collection Time   08/03/12  6:50 AM      Component Value Range Comment   Hemoglobin A1C 6.5 (*) <5.7 %    Mean Plasma Glucose 140 (*) <117 mg/dL   CBC     Status: Abnormal   Collection Time   08/03/12  6:50 AM      Component Value Range Comment   WBC 12.3 (*) 4.0 - 10.5 K/uL    RBC 2.97 (*) 3.87 - 5.11 MIL/uL    Hemoglobin 7.8 (*) 12.0 - 15.0 g/dL    HCT 16.1 (*) 09.6 - 46.0 %    MCV 86.2  78.0 - 100.0 fL    MCH 26.3  26.0 - 34.0 pg    MCHC 30.5   30.0 - 36.0 g/dL    RDW 04.5 (*) 40.9 - 15.5 %    Platelets 229  150 - 400 K/uL   LIPID PANEL     Status: Normal   Collection Time   08/03/12  6:50 AM      Component Value Range Comment   Cholesterol 109  0 - 200 mg/dL    Triglycerides 70  <811 mg/dL    HDL 42  >91 mg/dL    Total CHOL/HDL Ratio 2.6      VLDL 14  0 - 40 mg/dL    LDL Cholesterol 53  0 - 99 mg/dL   RENAL FUNCTION PANEL     Status: Abnormal   Collection Time   08/03/12  6:50 AM      Component Value Range Comment   Sodium 138  135 - 145 mEq/L    Potassium 3.9  3.5 - 5.1 mEq/L    Chloride 100  96 - 112 mEq/L    CO2 26  19 - 32 mEq/L    Glucose, Bld 117 (*) 70 - 99 mg/dL    BUN 37 (*) 6 - 23 mg/dL    Creatinine, Ser 4.78 (*) 0.50 - 1.10 mg/dL    Calcium 8.3 (*) 8.4 - 10.5 mg/dL    Phosphorus  4.2  2.3 - 4.6 mg/dL    Albumin 2.0 (*) 3.5 - 5.2 g/dL    GFR calc non Af Amer 8 (*) >90 mL/min    GFR calc Af Amer 9 (*) >90 mL/min   GLUCOSE, CAPILLARY     Status: Abnormal   Collection Time   08/03/12  7:09 AM      Component Value Range Comment   Glucose-Capillary 106 (*) 70 - 99 mg/dL    Comment 1 Notify RN      Comment 2 Documented in Chart     GLUCOSE, CAPILLARY     Status: Abnormal   Collection Time   08/03/12 12:16 PM      Component Value Range Comment   Glucose-Capillary 108 (*) 70 - 99 mg/dL    Comment 1 Documented in Chart      Comment 2 Notify RN       Ct Head Wo Contrast  08/02/2012  *RADIOLOGY REPORT*  Clinical Data: Altered mental status.  Possible TIA.  CT HEAD WITHOUT CONTRAST  Technique:  Contiguous axial images were obtained from the base of the skull through the vertex without contrast.  Comparison: 10/28/2011  Findings: Infarct noted within the right parietal lobe.  This is new since prior study but is likely subacute to chronic.  Diffuse cerebral atrophy.  Mild chronic small vessel disease changes.  No hydrocephalus.  No hemorrhage.  No mass lesion.  Extensive dural calcifications along the falx.  No  acute calvarial abnormality. Mastoids are clear.  IMPRESSION: Infarct within the right parietal lobe which I suspect is subacute to chronic.  Atrophy, chronic small vessel disease.   Original Report Authenticated By: Charlett Nose, M.D.    Dg Chest Portable 1 View  08/02/2012  *RADIOLOGY REPORT*  Clinical Data: Facial droop.  PORTABLE CHEST - 1 VIEW  Comparison: 03/06/2012  Findings: Heart is upper limits normal in size.  No confluent airspace opacities or effusions.  Mild peribronchial thickening. No acute bony abnormality.  IMPRESSION: Stable mild peribronchial thickening.   Original Report Authenticated By: Charlett Nose, M.D.      Assessment/Plan: 79 YO female with right parietal infarct in right MCA distribution and subacute.    Recommend:  1. HgbA1c, fasting lipid panel 2. MRI, MRA  of the brain without contrast 3. PT consult, OT consult, Speech consult 4. Echocardiogram 5. Carotid dopplers 6. Prophylactic therapy-Antiplatelet med: Aspirin - dose 325 mg daily 7. Risk  Factor modification      Felicie Morn PA-C [facility] [phone]  08/03/2012, 3:22 PM Patient seen and examined together with physician assistant and I concur with the assessment and plan.  Wyatt Portela, MD

## 2012-08-03 NOTE — Consult Note (Signed)
WOC consult Note Reason for Consult: sacral pressure ulcer. Pt from SNF, may have had surgical debridement at some time, unable to locate operative note to indicate this.  Wound type: Sacral pressure ulcer:Stage IV Pressure Ulcer POA: Yes Measurement:10cm x 12cm x 1.0cm with 5 cm undermining from 10-12 o'clock Wound bed: mostly clean, with some stringy slough, most of the necrotic tissue is at the same area of the undermining  Drainage (amount, consistency, odor) yellow-green with foul odor. Heavy amounts, saturated dressing. Periwound: intact Dressing procedure/placement/frequency: hydrotherapy for mechanical debridement of the necrotic tissue, saline moist gauze dressing to fill defect, changed daily with hydrotherapy.  Notified PT of new orders, and they were on the floor and proceeded with tx.  Air mattress ordered, but not in place yet. Requested unit secretary to follow up on this.  Will need air mattress in SNF if possible for moisture management and pressure redistribution.  WOC will follow along with you for wound management with PT for hydrotherapy.  Spencer Peterkin [place] RN,CWOCN [phone]

## 2012-08-03 NOTE — Clinical Social Work Note (Signed)
Clinical Social Work Brief Psychosocial Assessment  CSW received consult, as pt was admitted from SNF overnight, from MD.   CSW met with pt's daughter, Windell Moulding, who lives in [place], to address consult. CSW introduced herself and explained role of social work. CSW explained process of pt returning to SNF.   Pt's daughter shared that her sister in law, [name], was POA. CSW will follow up with her. Pt's daughter shared that pt would like to return to [facility], as she has been a long time resident of facility. Pt and family are agreeable to discharge plan. PT is not recommending any follow up.   CSW will continue to follow to facilitate discharge to [facility] with medically ready. CSW will follow up with facility. CSW will continue to follow.   Dede Query, MSW, Theresia Majors [phone]

## 2012-08-03 NOTE — Evaluation (Signed)
Physical Therapy Evaluation Patient Details Name: Linda [name] MRN: [id number] DOB: June 23, 1931 Today's Date: 08/03/2012 Time: 4098-1191 PT Time Calculation (min): 22 min  PT Assessment / Plan / Recommendation Clinical Impression  Pt admitted with AMS with period of unresponsiveness. Spoke with Rn at [facility] who stated that pt is dependent with all mobility, pt assists minimally with rolling. Pt is following very little commands and is not participatory in therapy. Will not follow acutely, please reorder if necessary.    PT Assessment  Patent does not need any further PT services    Follow Up Recommendations  No PT follow up                   Precautions / Restrictions Restrictions Weight Bearing Restrictions: No Other Position/Activity Restrictions: pt with sacral wound, turn schedule   Pertinent Vitals/Pain Faces 10/10 with all movement. RN aware.       Mobility  Bed Mobility Bed Mobility: Rolling Right;Rolling Left Rolling Right: 1: +2 Total assist Rolling Right: Patient Percentage: 0% Rolling Left: 1: +2 Total assist Rolling Left: Patient Percentage: 0% Details for Bed Mobility Assistance: Pt dependent during rolling. Pt unable to assist at all Transfers Transfers: Not assessed Ambulation/Gait Ambulation/Gait Assistance: Not tested (comment)     Visit Information  Last PT Received On: 08/03/12 Assistance Needed: +2    Subjective Data      Prior Functioning  Home Living Available Help at Discharge: Skilled Nursing Facility;Available 24 hours/day Type of Home: Skilled Nursing Facility Additional Comments: Pt lives at [facility] Prior Function Level of Independence: Needs assistance Needs Assistance: Bathing;Dressing;Feeding;Toileting;Meal Prep;Transfers Bath: Total Dressing: Total Feeding: Total Toileting: Total Meal Prep: Total Transfer Assistance: pt total assist with lift pad. Pt able to assist minimally with rolling Able to  Take Stairs?: No Driving: No Comments: spoke with RN at [facility] to gather information Communication Communication: Expressive difficulties    Cognition  Overall Cognitive Status: History of cognitive impairments - further impaired Area of Impairment: Following commands Arousal/Alertness: Lethargic Orientation Level: Disoriented to;Place;Time;Situation Behavior During Session: Agitated Following Commands: Follows one step commands inconsistently Cognition - Other Comments: Rn at [facility] stated that pt has some expressive difficulties andi s alert to name only. Pt currently following minimal commands and unable to decipher    Extremity/Trunk Assessment Right Lower Extremity Assessment RLE ROM/Strength/Tone: Deficits;Unable to fully assess;Due to impaired cognition RLE ROM/Strength/Tone Deficits: pt with BKA Left Lower Extremity Assessment LLE ROM/Strength/Tone: Deficits;Unable to fully assess;Due to impaired cognition LLE ROM/Strength/Tone Deficits: pt with BKA   Balance    End of Session PT - End of Session Activity Tolerance: Patient limited by pain;Other (comment) (limited by cognition) Patient left: in bed;with call bell/phone within reach;Other (comment) (turned 1/4 on left side) Nurse Communication: Mobility status  GP     Milana Kidney 08/03/2012, 10:37 AM  08/03/2012 Milana Kidney DPT PAGER: [phone] OFFICE: [phone]

## 2012-08-03 NOTE — Progress Notes (Signed)
  Echocardiogram 2D Echocardiogram has been performed.  Linda [name] 08/03/2012, 9:40 AM

## 2012-08-03 NOTE — Progress Notes (Signed)
Occupational Therapy Note  Chart reviewed and spoke with PT.  Pt.  Is LTC resident of [facility] and was dependent with all BADLs.  No OT needs identified.  Will sign off.  Jeani Hawking, OTR/L [phone]

## 2012-08-03 NOTE — Progress Notes (Signed)
SLP Cancellation Note  Patient Details Name: Linda [name] MRN: [id number] DOB: 31-Mar-1931   Cancelled swallow evaluation:       Reason Eval/Treat Not Completed: Pain limiting ability to participate; pt refused all POs.  Will attempt next date.    Blenda Mounts Laurice 08/03/2012, 10:28 AM

## 2012-08-04 DIAGNOSIS — I639 Cerebral infarction, unspecified: Secondary | ICD-10-CM | POA: Diagnosis present

## 2012-08-04 DIAGNOSIS — I6350 Cerebral infarction due to unspecified occlusion or stenosis of unspecified cerebral artery: Principal | ICD-10-CM

## 2012-08-04 LAB — URINE CULTURE
Colony Count: NO GROWTH
Culture: NO GROWTH

## 2012-08-04 LAB — RENAL FUNCTION PANEL
Albumin: 1.9 g/dL — ABNORMAL LOW (ref 3.5–5.2)
BUN: 41 mg/dL — ABNORMAL HIGH (ref 6–23)
CO2: 24 mEq/L (ref 19–32)
Chloride: 100 mEq/L (ref 96–112)
Creatinine, Ser: 5.63 mg/dL — ABNORMAL HIGH (ref 0.50–1.10)
Potassium: 4.4 mEq/L (ref 3.5–5.1)

## 2012-08-04 LAB — CBC
Hemoglobin: 7.2 g/dL — ABNORMAL LOW (ref 12.0–15.0)
MCH: 26.4 pg (ref 26.0–34.0)
MCV: 87.2 fL (ref 78.0–100.0)
Platelets: 269 10*3/uL (ref 150–400)
RBC: 2.73 MIL/uL — ABNORMAL LOW (ref 3.87–5.11)
WBC: 8.4 10*3/uL (ref 4.0–10.5)

## 2012-08-04 LAB — GLUCOSE, CAPILLARY
Glucose-Capillary: 83 mg/dL (ref 70–99)
Glucose-Capillary: 94 mg/dL (ref 70–99)
Glucose-Capillary: 88 mg/dL (ref 70–99)

## 2012-08-04 LAB — HEPATITIS B SURFACE ANTIBODY,QUALITATIVE: Hep B S Ab: REACTIVE — AB

## 2012-08-04 MED ORDER — LORAZEPAM 2 MG/ML IJ SOLN
0.2500 mg | Freq: Every evening | INTRAMUSCULAR | Status: DC | PRN
  Administered 2012-08-04 – 2012-08-10 (×2): 0.25 mg via INTRAVENOUS
  Filled 2012-08-04 (×2): qty 1

## 2012-08-04 MED ORDER — LIDOCAINE-PRILOCAINE 2.5-2.5 % EX CREA
1.0000 "application " | TOPICAL_CREAM | CUTANEOUS | Status: DC | PRN
  Filled 2012-08-04: qty 5

## 2012-08-04 MED ORDER — PENTAFLUOROPROP-TETRAFLUOROETH EX AERO: 1.0000 "application " | INHALATION_SPRAY | CUTANEOUS | Status: DC | PRN

## 2012-08-04 MED ORDER — LIDOCAINE HCL (PF) 1 % IJ SOLN: 5.0000 mL | INTRAMUSCULAR | Status: DC | PRN

## 2012-08-04 MED ORDER — HEPARIN SODIUM (PORCINE) 1000 UNIT/ML DIALYSIS: 1000.0000 [IU] | INTRAMUSCULAR | Status: DC | PRN

## 2012-08-04 MED ORDER — SODIUM CHLORIDE 0.9 % IV SOLN: 100.0000 mL | INTRAVENOUS | Status: DC | PRN

## 2012-08-04 MED ORDER — ALTEPLASE 2 MG IJ SOLR
2.0000 mg | Freq: Once | INTRAMUSCULAR | Status: DC | PRN
  Filled 2012-08-04: qty 2

## 2012-08-04 MED ORDER — NEPRO/CARBSTEADY PO LIQD
237.0000 mL | ORAL | Status: DC | PRN
  Filled 2012-08-04: qty 237

## 2012-08-04 NOTE — Progress Notes (Signed)
[facility] PROGRESS NOTE  Linda [name] ZOX:[id number] DOB: 1931/04/16 DOA: 08/02/2012 PCP: Colon Branch, MD  Assessment/Plan: Active Problems:  Altered mental status  Syncope  ESRD (end stage renal disease)  Diabetes mellitus, type 2  HTN (hypertension)  Anemia  Sacral decubitus ulcer  FTT (failure to thrive) in adult    1. Altered mental status/CVA: Patient presented with what was described as an episode of very poor responsiveness, associated with facial droop, following HD. Clinically, she does have some weakness of left upper extremity. Head CT scan revealed an infarct within the right parietal lobe which may be subacute to chronic. Patient reportedly, was last seen normal at 2:15 PM on 08/02/12, so she was outside the window for t-PA, and symptoms had improved on arrival, although her other co-morbidities, including a moderate anemia, may also have been contraindications. Dr Wyatt Portela provided neurology consultation and CVA w/u, carried out. Brain MRI showed subcentimeter left frontal subcortical white matter infarct, approximately 5 mm in diameter. No hemorrhage. Advanced atrophy and extensive chronic microvascular ischemic change. Remote ischemic events are stable. MRA revealed potentially flow reducing lesion supraclinoid internal carotid artery on the right, estimated 50-75% stenosis.Widespread intracranial atherosclerotic disease elsewhere. 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 Carotid doppler showed no evidence of hemodynamically significant internal carotid artery stenosis. Vertebral artery flow is antegrade. 2D Echocardiogram revealed normal LV cavity size, mild LVH and EF of 65% to 70%. Continued on anti-platelet medication. EKG shows SR, with old Q-waves in V1-V2, but no acute ischemic changes. Swallow evaluation will be done per protocol, but is still pending. 2. ESRD (end stage  renal disease): Patient is on HD T-T-S, and is s/p hemodialysis on 08/02/12. Dr Delano Metz provided nephrology consultation and regular dialysis schedule has been reinstated.  3 Diabetes mellitus, type 2: Patient has insulin-requiring type 2 diabetes mellitus. CBG was 66 on presentation, but came up to 80, while in the ED. As patient is clearly at high risk of hypoglycemia, given ESRD, insulin therapy and NPO status, she has been placed on dextrose infusion, albeit at low rate of 40cc/hr, to avoid fluid overload. All insulin is on hold, and CBGs have remained satisfactory. 4. HTN (hypertension): BP is currently ranging between 154/76-165/62. This is reasonable in the context of new CVA. Will avoid aggressive BP control.  5. Anemia: This moderate, normocytic and due to chronic disease. Following CBC.  6. Sacral decubitus ulcer: Patient has a rather larg, approximately 8 cm x 9 cm stage 4 sacral decubitus, and is reportedly, s/p recent debridement. Wound shows evidence of granulation, and does not appear overly infected. Utilizing mattress overlay. Patient has been evaluated by WOC, has undergone further sharp debridement and hydrotherapy. Managing as recommendation.  7. Recent pyelonephritis: Patient is reportedly, s/p recent hospitalization at [facility], for a pyelonephritis, for which shes was treated with Tobramycin, then Vancomycin. She is afebrile, and does not look toxic. Repeat urinalysis shows pyuria. Culture is pending.  8. FTT: It appears that patient has had FTT and poor oral intake, and that the question of possible PEG was discussed at SNF. Perhaps, this will need to be addressed, during this hospitalization. Have discussed with daughter today at the bedside, and she will get back to me.    Code Status: Full Code.  Family Communication: Discussed in detail with daughter at bedside.  Disposition Plan: To be determined.    Brief narrative: This is a 79 year old female  nursing  facility resident, with known history of ESRD on HD T-T-S, chronic anemia, DM, dyslipidemia, secondary hyperparathyroidism, HTN, PVD, s/p bilateral BKA, GERD, sacral decubitus s/p recent debridement,s/p recent hospitalization at [facility], for pyelonephritis, treated with Tobramycin, then discharged on Vancomycin with HD. Apparently, patient is s/p HD on 08/02/12, which she completed without incident, then en route to the nursing facility, she had an episode of unresponsiveness, associated with facial drooping, lasting for about 20 mins, before patient arrived at the nursing facility. She was then brought to the ED. Per ED MD, patient on arrival, was asymptomatic and at baseline mental status. She was admitted for further evaluation and management.    Consultants:  Dr Wyatt Portela, neurologist.  Dr Delano Metz, nephrologist.   Procedures:  Head CT scan.   CXR.   Antibiotics:  N/A.   HPI/Subjective: No new issues.   Objective: Vital signs in last 24 hours: Temp:  [98.2 F (36.8 C)-99.1 F (37.3 C)] 99.1 F (37.3 C) (01/18 0651) Pulse Rate:  [101-111] 111  (01/18 1025) Resp:  [19-22] 19  (01/18 1025) BP: (167-174)/(60-65) 174/60 mmHg (01/18 1025) SpO2:  [95 %-100 %] 95 % (01/18 1025) Weight change:     Intake/Output from previous day:       Physical Exam: General: Comfortable. Not short of breath at rest.  HEENT: Moderate clinical pallor, no jaundice, no conjunctival injection or discharge.  NECK: Supple, JVP not seen, no carotid bruits, no palpable lymphadenopathy, no palpable goiter.  CHEST: Clinically clear to auscultation, no wheezes, no crackles.  HEART: Sounds 1 and 2 heard, normal, regular, no murmurs.  ABDOMEN: Full, soft, non-tender, no palpable organomegaly, no palpable masses, normal bowel sounds.  GENITALIA: Not examined.  LOWER EXTREMITIES: Unremarkable bilateral BKA stumps.  MUSCULOSKELETAL SYSTEM: Bilateral BKA, otherwise, generalized  osteoarthritic changes, otherwise, no.  CENTRAL NERVOUS SYSTEM: There is currently no facial asymmetry, but patient has weakness of left arm grip.  Lab Results:  Basename 08/04/12 0550 08/03/12 0650  WBC 8.4 12.3*  HGB 7.2* 7.8*  HCT 23.8* 25.6*  PLT 269 229    Basename 08/04/12 0550 08/03/12 0650  NA 140 138  K 4.4 3.9  CL 100 100  CO2 24 26  GLUCOSE 79 117*  BUN 41* 37*  CREATININE 5.63* 4.92*  CALCIUM 8.5 8.3*   Recent Results (from the past 240 hour(s))  MRSA PCR SCREENING     Status: Abnormal   Collection Time   08/02/12  9:45 PM      Component Value Range Status Comment   MRSA by PCR POSITIVE (*) NEGATIVE Final      Studies/Results: Ct Head Wo Contrast  08/02/2012  *RADIOLOGY REPORT*  Clinical Data: Altered mental status.  Possible TIA.  CT HEAD WITHOUT CONTRAST  Technique:  Contiguous axial images were obtained from the base of the skull through the vertex without contrast.  Comparison: 10/28/2011  Findings: Infarct noted within the right parietal lobe.  This is new since prior study but is likely subacute to chronic.  Diffuse cerebral atrophy.  Mild chronic small vessel disease changes.  No hydrocephalus.  No hemorrhage.  No mass lesion.  Extensive dural calcifications along the falx.  No acute calvarial abnormality. Mastoids are clear.  IMPRESSION: Infarct within the right parietal lobe which I suspect is subacute to chronic.  Atrophy, chronic small vessel disease.   Original Report Authenticated By: Charlett Nose, M.D.    Mr Brain Wo Contrast  08/03/2012  *RADIOLOGY REPORT*  Clinical Data:  Unresponsiveness associated with facial droop following hemodialysis.  Chronic renal failure.  MRI HEAD WITHOUT CONTRAST MRA HEAD WITHOUT CONTRAST  Technique:  Multiplanar, multiecho pulse sequences of the brain and surrounding structures were obtained without intravenous contrast. Angiographic images of the head were obtained using MRA technique without contrast.  Comparison:  Most  recent CT head 08/02/2012.  MRI HEAD  Findings:  The patient had difficulty remaining motionless for the study.  Images are suboptimal.  Small or subtle lesions could be overlooked.  There is a subcentimeter infarct affecting the left frontal white matter in a subcortical location, approximately 5 mm in diameter. No other areas of acute infarction are seen.  There is no   intracranial hemorrhage, mass lesion, hydrocephalus, or extra-axial fluid. There is advanced atrophy and extensive chronic microvascular ischemic change.  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  Tiny focus chronic hemorrhage left posterior temporal region.  Remote brainstem lacunar infarcts.  Remote left basal ganglia lacunar infarcts.  Extensive dural calcification. No scalp or osseous lesions.  No gross midline abnormality.  Chronic sinus disease.  No acute mastoid fluid. Compared with prior CT, this infarct is not visible.  IMPRESSION: Subcentimeter left frontal subcortical white matter infarct, approximately 5 mm in diameter.  No hemorrhage.  Advanced atrophy and extensive chronic microvascular ischemic change.  Remote ischemic events are stable.  MRA HEAD  Findings: There is a 50-75% stenosis of the supraclinoid right ICA. The left internal carotid artery shows mild nonstenotic irregularity in its supraclinoid segment.  Long segment 75-90% stenosis proximal right anterior cerebral artery.  Both distal ACAs fill from the left.  Mild nonstenotic irregularity of the proximal middle cerebral arteries bilaterally. Poor flow related enhancement of the distal MCA branches on the right is noted, reflecting previous right hemisphere ischemic event.  The basilar artery is widely patent with left vertebral dominant. Hypoplasia of the distal right vertebral relates to primarily supply of this vessel of the PICA.  There is mild irregularity of the distal PCA without proximal narrowing.  Cerebellar branches are  poorly seen.  There is a 3 mm aneurysm projecting anteriorly from the right internal carotid artery at the ophthalmic origin.  A tiny inferior outpouching from the supraclinoid ICA on the left, no more than 1 mm in size (image 73 series 5) could be atherosclerotic in nature, or arise from a paraophthalmic location.  IMPRESSION: Potentially flow reducing lesion supraclinoid internal carotid artery on the right, estimated 50-75% stenosis.Widespread intracranial atherosclerotic disease elsewhere.  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   Original Report Authenticated By: Davonna Belling, M.D.    Dg Chest Portable 1 View  08/02/2012  *RADIOLOGY REPORT*  Clinical Data: Facial droop.  PORTABLE CHEST - 1 VIEW  Comparison: 03/06/2012  Findings: Heart is upper limits normal in size.  No confluent airspace opacities or effusions.  Mild peribronchial thickening. No acute bony abnormality.  IMPRESSION: Stable mild peribronchial thickening.   Original Report Authenticated By: Charlett Nose, M.D.    Mr Mra Head/brain Wo Cm  08/03/2012  *RADIOLOGY REPORT*  Clinical Data:  Unresponsiveness associated with facial droop following hemodialysis.  Chronic renal failure.  MRI HEAD WITHOUT CONTRAST MRA HEAD WITHOUT CONTRAST  Technique:  Multiplanar, multiecho pulse sequences of the brain and surrounding structures were obtained without intravenous contrast. Angiographic images of the head were obtained using MRA technique without contrast.  Comparison:  Most recent CT head 08/02/2012.  MRI HEAD  Findings:  The patient had difficulty remaining motionless for the study.  Images are suboptimal.  Small or subtle lesions could be overlooked.  There is a subcentimeter infarct affecting the left frontal white matter in a subcortical location, approximately 5 mm in diameter. No other areas of acute infarction are seen.  There is no   intracranial hemorrhage, mass  lesion, hydrocephalus, or extra-axial fluid. There is advanced atrophy and extensive chronic microvascular ischemic change.  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  Tiny focus chronic hemorrhage left posterior temporal region.  Remote brainstem lacunar infarcts.  Remote left basal ganglia lacunar infarcts.  Extensive dural calcification. No scalp or osseous lesions.  No gross midline abnormality.  Chronic sinus disease.  No acute mastoid fluid. Compared with prior CT, this infarct is not visible.  IMPRESSION: Subcentimeter left frontal subcortical white matter infarct, approximately 5 mm in diameter.  No hemorrhage.  Advanced atrophy and extensive chronic microvascular ischemic change.  Remote ischemic events are stable.  MRA HEAD  Findings: There is a 50-75% stenosis of the supraclinoid right ICA. The left internal carotid artery shows mild nonstenotic irregularity in its supraclinoid segment.  Long segment 75-90% stenosis proximal right anterior cerebral artery.  Both distal ACAs fill from the left.  Mild nonstenotic irregularity of the proximal middle cerebral arteries bilaterally. Poor flow related enhancement of the distal MCA branches on the right is noted, reflecting previous right hemisphere ischemic event.  The basilar artery is widely patent with left vertebral dominant. Hypoplasia of the distal right vertebral relates to primarily supply of this vessel of the PICA.  There is mild irregularity of the distal PCA without proximal narrowing.  Cerebellar branches are poorly seen.  There is a 3 mm aneurysm projecting anteriorly from the right internal carotid artery at the ophthalmic origin.  A tiny inferior outpouching from the supraclinoid ICA on the left, no more than 1 mm in size (image 73 series 5) could be atherosclerotic in nature, or arise from a paraophthalmic location.  IMPRESSION: Potentially flow reducing lesion supraclinoid internal carotid artery on the  right, estimated 50-75% stenosis.Widespread intracranial atherosclerotic disease elsewhere.  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   Original Report Authenticated By: Davonna Belling, M.D.     Medications: Scheduled Meds:    . aspirin  300 mg Rectal Daily   Or  . aspirin  325 mg Oral Daily  . Chlorhexidine Gluconate Cloth  6 each Topical Q0600  . darbepoetin (ARANESP) injection - DIALYSIS  60 mcg Intravenous Q Sat-HD  . doxercalciferol  0.5 mcg Intravenous Q T,Th,Sa-HD  . heparin  5,000 Units Subcutaneous Q8H  . mupirocin ointment  1 application Nasal BID  . sodium chloride  3 mL Intravenous Q12H   Continuous Infusions:    . dextrose 5 % and 0.9% NaCl 40 mL/hr at 08/04/12 0512   PRN Meds:.sodium chloride, acetaminophen, acetaminophen, ondansetron (ZOFRAN) IV, sodium chloride    LOS: 2 days   Nayanna Seaborn,CHRISTOPHER  [facility] Pager [phone]. If 8PM-8AM, please contact night-coverage at [email or web address], password [facility] 08/04/2012, 11:25 AM  LOS: 2 days

## 2012-08-04 NOTE — Progress Notes (Signed)
Stroke Team Progress Note  HISTORY  Linda [name] is an 79 y.o. female patient who after HD 08/03/12, while en route to the nursing facility,had an episode of unresponsiveness, associated with facial drooping, lasting about 20 mins, before patient arrived at the nursing facility. She was then brought to the ED. Per ED MD, patient on arrival, was asymptomatic and at baseline mental status. CT head showed a right parietal infarct. Neurology consulted for infarct. At time of consultation patient was nonverbal, followed no commands, clenched eyes tightly, moaned when UE were moved.   TPA was not given as she was outside of the therapeutic window. She was admitted  for further evaluation and treatment.  SUBJECTIVE The patient's daughter's at the bedside this morning. She states that the patient was communicating with her yesterday. She is not responded much since returning from her MRI. The patient's daughter stated with her throughout the night.  OBJECTIVE Most recent Vital Signs: Filed Vitals:   08/03/12 1010 08/03/12 2106 08/04/12 0243 08/04/12 0651  BP: 163/74 168/65 167/62 171/63  Pulse: 100 101 103 104  Temp: 97.9 F (36.6 C) 98.2 F (36.8 C) 98.7 F (37.1 C) 99.1 F (37.3 C)  TempSrc: Oral Axillary Oral Axillary  Resp: 18 20 22 20   Weight:      SpO2: 100% 99% 100% 100%   CBG (last 3)   Basename 08/04/12 0828 08/04/12 0418 08/04/12 0236  GLUCAP 83 76 79    IV Fluid Intake:     . dextrose 5 % and 0.9% NaCl 40 mL/hr at 08/04/12 0512    MEDICATIONS    . aspirin  300 mg Rectal Daily   Or  . aspirin  325 mg Oral Daily  . Chlorhexidine Gluconate Cloth  6 each Topical Q0600  . darbepoetin (ARANESP) injection - DIALYSIS  60 mcg Intravenous Q Sat-HD  . doxercalciferol  0.5 mcg Intravenous Q T,Th,Sa-HD  . heparin  5,000 Units Subcutaneous Q8H  . mupirocin ointment  1 application Nasal BID  . sodium chloride  3 mL Intravenous Q12H   PRN:  sodium chloride, acetaminophen,  acetaminophen, ondansetron (ZOFRAN) IV, sodium chloride  Diet:  NPO  Activity:  Bedrest DVT Prophylaxis:  Gardiner Heparin  CLINICALLY SIGNIFICANT STUDIES Basic Metabolic Panel:  Lab 08/04/12 4098 08/03/12 0650  NA 140 138  K 4.4 3.9  CL 100 100  CO2 24 26  GLUCOSE 79 117*  BUN 41* 37*  CREATININE 5.63* 4.92*  CALCIUM 8.5 8.3*  MG -- --  PHOS 4.9* 4.2   Liver Function Tests:  Lab 08/04/12 0550 08/03/12 0650 08/02/12 1651  AST -- -- 14  ALT -- -- <5  ALKPHOS -- -- 106  BILITOT -- -- 0.1*  PROT -- -- 7.2  ALBUMIN 1.9* 2.0* --   CBC:  Lab 08/04/12 0550 08/03/12 0650 08/02/12 1651  WBC 8.4 12.3* --  NEUTROABS -- -- 13.1*  HGB 7.2* 7.8* --  HCT 23.8* 25.6* --  MCV 87.2 86.2 --  PLT 269 229 --   Coagulation: No results found for this basename: LABPROT:4,INR:4 in the last 168 hours Cardiac Enzymes:  Lab 08/02/12 1652  CKTOTAL --  CKMB --  CKMBINDEX --  TROPONINI <0.30   Urinalysis:  Lab 08/02/12 1843  COLORURINE YELLOW  LABSPEC 1.016  PHURINE 7.5  GLUCOSEU NEGATIVE  HGBUR MODERATE*  BILIRUBINUR NEGATIVE  KETONESUR NEGATIVE  PROTEINUR >300*  UROBILINOGEN 0.2  NITRITE NEGATIVE  LEUKOCYTESUR LARGE*   Lipid Panel    Component Value Date/Time  CHOL 109 08/03/2012 0650   TRIG 70 08/03/2012 0650   HDL 42 08/03/2012 0650   CHOLHDL 2.6 08/03/2012 0650   VLDL 14 08/03/2012 0650   LDLCALC 53 08/03/2012 0650   HgbA1C  Lab Results  Component Value Date   HGBA1C 6.5* 08/03/2012    Urine Drug Screen:   No results found for this basename: labopia, cocainscrnur, labbenz, amphetmu, thcu, labbarb    Alcohol Level: No results found for this basename: ETH:2 in the last 168 hours  Ct Head Wo Contrast 08/02/2012 IMPRESSION: Infarct within the right parietal lobe which I suspect is subacute to chronic.  Atrophy, chronic small vessel disease.   Dg Chest Portable 1 View 08/02/12  IMPRESSION: Stable mild peribronchial thickening.    Mr Head/brain Wo Cm 08/03/2012   IMPRESSION: Subcentimeter left frontal subcortical white matter infarct, approximately 5 mm in diameter.  No hemorrhage.  Advanced atrophy and extensive chronic microvascular ischemic change.  Remote ischemic events are stable.    MRA HEAD    IMPRESSION: Potentially flow reducing lesion supraclinoid internal carotid artery on the right, estimated 50-75% stenosis.Widespread intracranial atherosclerotic disease elsewhere.  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    2D Echocardiogram  The estimated ejection fraction was in the range of 65% to 70%. No obvious embolic source noted.   Carotid Doppler  Bilateral: No evidence of hemodynamically significant internal carotid artery stenosis. Vertebral artery flow is antegrade.    EKG  - ST rate 107 BPM  Therapy Recommendations No further PT or OT recommended. Speech therapy is still in evaluating the patient.  Physical Exam   General - Minimally responsive. Does not follow commands. Groans occasionally. Keeps eyes closed.   ASSESSMENT Linda [name] is a 79 y.o. female presenting with unresponsiveness . No TPA was given as the patient was outside of the therapeutic window. Imaging confirms a left frontal subcortical white matter infarct, approximately 5 mm in diameter. Infarct felt to be thrombotic secondary to small vessel disease.  Work up underway. On aspirin 81 mg orally every day prior to admission. Now on aspirin suppository 300 mg daily for secondary stroke prevention. Patient with resultant decreased level of responsiveness and right hemiplegia.   End stage renal disease  Bilateral BKAs  Hyperlipidemia  Hypertension  Syncope  Diabetes mellitus  Anemia  Hospital day # 2  TREATMENT/PLAN  Continue aspirin 325 mg orally every day for secondary stroke prevention.  Await further speech therapy evaluation  Hassel Neth [facility] Pager [phone] 08/04/2012, 10:15 AM  I have personally obtained a history , evaluated imaging results, and formulated the assessment and plan of care. I agree with the above.   Delia Heady, MD Medical Director [facility] Pager: [phone] 08/04/2012 12:32 PM

## 2012-08-04 NOTE — Progress Notes (Signed)
HYDROTHERAPY NOTE   08/04/12 1110  Wound 08/02/12 Other (Comment) Back Mid;Lower  Date First Assessed/Time First Assessed: 08/02/12 1820   Wound Type: (c) Other (Comment)  Location: Back  Location Orientation: Mid;Lower  Present on Admission: Yes  Site / Wound Assessment Pink;Yellow;Black;Brown;Dusky;Granulation tissue;Red  % Wound base Red or Granulating 55%  % Wound base Yellow 30%  % Wound base Black 10%  % Wound base Other (Comment) 5% (bone chips)  Peri-wound Assessment Pink  Margins Unattacted edges (unapproximated)  Closure None  Drainage Amount None  Drainage Description Serosanguineous  Non-staged Wound Description Full thickness  Treatment Cleansed;Debridement (Selective);Hydrotherapy (Pulse lavage);Packing (Saline gauze)  Dressing Type ABD;Other (Comment);Barrier Film (skin prep);Gauze (Comment)  Dressing Changed Changed  Dressing Status Clean;Dry;Intact  Hydrotherapy  Pulsed Lavage with Suction (psi) 4 psi  Pulsed Lavage with Suction - Normal Saline Used 1000 mL  Pulsed Lavage Tip Tip with splash shield  Pulsed lavage therapy - wound location Sacral wound  Selective Debridement  Selective Debridement - Location sacral wound  Selective Debridement - Tools Used Forceps;Scalpel  Selective Debridement - Tissue Removed smal area of black eschar removed as well as multiple sites of yellow eschar  Wound Therapy - Assess/Plan/Recommendations  Wound Therapy - Clinical Statement Wound looking better despite very little consistent nutrition.  Debridement to continue.  Wound Therapy - Functional Problem List decreased skin integrity, decreased functional mobility  Factors Delaying/Impairing Wound Healing Altered sensation;Diabetes Mellitus;Multiple medical problems;Vascular compromise;Infection - systemic/local  Hydrotherapy Plan Debridement;Patient/family education;Pulsatile lavage with suction  Wound Therapy - Frequency 6X / week  Wound Plan Will see 6x a week as indicated with  PLS, selective debridement and dressing changes  Wound Therapy Goals - Improve the function of patient's integumentary system by progressing the wound(s) through the phases of wound healing by:  Decrease Necrotic Tissue - Progress Progressing toward goal  Increase Granulation Tissue - Progress Progressing toward goal  Improve Drainage Characteristics - Progress Progressing toward goal  Time For Goal Achievement 7 days  Wound Therapy - Potential for Goals Fair    08/04/2012  [place], [place] [phone] (pager)

## 2012-08-04 NOTE — Progress Notes (Signed)
Bilateral:  No evidence of hemodynamically significant internal carotid artery stenosis.   Vertebral artery flow is antegrade.     

## 2012-08-04 NOTE — Progress Notes (Signed)
Patient ID: Linda [name], female   DOB: [birth year], 79 y.o.   MRN: [id number]   [facility] Progress Note    Subjective:   No acute events overnight- reports some discomfort over her back   Objective:   BP 171/63  Pulse 104  Temp 99.1 F (37.3 C) (Axillary)  Resp 20  Wt 51.8 kg (114 lb 3.2 oz)  SpO2 100%  Physical Exam: NGE:XBMWUXLKG and remains lethargic MWN:UUVOZ regular tachycardia, normal s1 and s2  Resp:Coarse BS bilaterally, no rales/rhonchi DGU:YQIH, flat, NT Ext:s/p bilateral BKAs  Labs: BMET  Lab 08/04/12 0550 08/03/12 0650 08/02/12 1651  NA 140 138 137  K 4.4 3.9 3.8  CL 100 100 96  CO2 24 26 26   GLUCOSE 79 117* 81  BUN 41* 37* 34*  CREATININE 5.63* 4.92* 4.39*  ALB -- -- --  CALCIUM 8.5 8.3* 8.6  PHOS 4.9* 4.2 --   CBC  Lab 08/04/12 0550 08/03/12 0650 08/02/12 1651  WBC 8.4 12.3* 15.4*  NEUTROABS -- -- 13.1*  HGB 7.2* 7.8* 8.4*  HCT 23.8* 25.6* 27.6*  MCV 87.2 86.2 86.3  PLT 269 229 280    Medications:      . aspirin  300 mg Rectal Daily   Or  . aspirin  325 mg Oral Daily  . Chlorhexidine Gluconate Cloth  6 each Topical Q0600  . darbepoetin (ARANESP) injection - DIALYSIS  60 mcg Intravenous Q Sat-HD  . doxercalciferol  0.5 mcg Intravenous Q T,Th,Sa-HD  . heparin  5,000 Units Subcutaneous Q8H  . mupirocin ointment  1 application Nasal BID  . sodium chloride  3 mL Intravenous Q12H     Assessment/ Plan:   1. Altered mental status/facial droop- subacute vs chronic CVA on CT scan. Ongoing further workup/management per neurology recommendations. Overall prognosis unfortunate seems to be poor in this woman with a poor functional status/pre-existing dementia.  2. End-stage renal disease on hemodialysis: Plan for HD today- poor functional status/prognosis/QOL 3. Chronic anemia-underlying ESRD as well as resistance to ESA from underlying wound/infection/ongoing blood loss.  4. Metabolic bone disease: Continue Hectorol for PTH  management, currently n.p.o.-off phosphorus binders pending swallow evaluation.  5. Sacral decubitus: Ongoing aggressive wound care/debridement.    Zetta Bills, MD 08/04/2012, 9:56 AM

## 2012-08-05 DIAGNOSIS — D649 Anemia, unspecified: Secondary | ICD-10-CM

## 2012-08-05 LAB — RENAL FUNCTION PANEL
CO2: 27 mEq/L (ref 19–32)
GFR calc Af Amer: 13 mL/min — ABNORMAL LOW (ref >90)
GFR calc non Af Amer: 11 mL/min — ABNORMAL LOW (ref >90)
Glucose, Bld: 102 mg/dL — ABNORMAL HIGH (ref 70–99)
Potassium: 3.5 mEq/L (ref 3.5–5.1)
Sodium: 138 mEq/L (ref 135–145)

## 2012-08-05 LAB — GLUCOSE, CAPILLARY
Glucose-Capillary: 87 mg/dL (ref 70–99)
Glucose-Capillary: 99 mg/dL (ref 70–99)
Glucose-Capillary: 98 mg/dL (ref 70–99)
Glucose-Capillary: 113 mg/dL — ABNORMAL HIGH (ref 70–99)
Glucose-Capillary: 136 mg/dL — ABNORMAL HIGH (ref 70–99)

## 2012-08-05 LAB — CBC
Hemoglobin: 7.8 g/dL — ABNORMAL LOW (ref 12.0–15.0)
Platelets: 288 10*3/uL (ref 150–400)
RBC: 2.94 MIL/uL — ABNORMAL LOW (ref 3.87–5.11)
WBC: 8.3 10*3/uL (ref 4.0–10.5)

## 2012-08-05 MED ORDER — PNEUMOCOCCAL VAC POLYVALENT 25 MCG/0.5ML IJ INJ
0.5000 mL | INJECTION | INTRAMUSCULAR | Status: AC
  Administered 2012-08-06: 0.5 mL via INTRAMUSCULAR
  Filled 2012-08-05: qty 0.5

## 2012-08-05 MED ORDER — INFLUENZA VIRUS VACC SPLIT PF IM SUSP
0.5000 mL | INTRAMUSCULAR | Status: AC
  Filled 2012-08-05: qty 0.5

## 2012-08-05 NOTE — Progress Notes (Signed)
[facility] PROGRESS NOTE  Linda [name] WUJ:[id number] DOB: 11/21/1930 DOA: 08/02/2012 PCP: Colon Branch, MD  Assessment/Plan: Active Problems:  Altered mental status  Syncope  ESRD (end stage renal disease)  Diabetes mellitus, type 2  HTN (hypertension)  Anemia  Sacral decubitus ulcer  FTT (failure to thrive) in adult  CVA (cerebral infarction)    1. Altered mental status/CVA: Patient presented with what was described as an episode of very poor responsiveness, associated with facial droop, following HD. Clinically, she does have some weakness of left upper extremity. Head CT scan revealed an infarct within the right parietal lobe which may be subacute to chronic. Patient reportedly, was last seen normal at 2:15 PM on 08/02/12, so she was outside the window for t-PA, and symptoms had improved on arrival, although her other co-morbidities, including a moderate anemia, may also have been contraindications. Dr Wyatt Portela provided neurology consultation and CVA w/u, carried out. Brain MRI showed subcentimeter left frontal subcortical white matter infarct, approximately 5 mm in diameter. No hemorrhage. Advanced atrophy and extensive chronic microvascular ischemic change. Remote ischemic events are stable. MRA revealed potentially flow reducing lesion supraclinoid internal carotid artery on the right, estimated 50-75% stenosis.Widespread intracranial atherosclerotic disease elsewhere. 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 Carotid doppler showed no evidence of hemodynamically significant internal carotid artery stenosis. Vertebral artery flow is antegrade. 2D Echocardiogram revealed normal LV cavity size, mild LVH and EF of 65% to 70%. Continued on anti-platelet medication. EKG shows SR, with old Q-waves in V1-V2, but no acute ischemic changes. Swallow evaluation will be done per protocol, but is still  pending. 2. ESRD (end stage renal disease): Patient is on HD T-T-S, and is s/p hemodialysis on 08/02/12. Dr Delano Metz provided nephrology consultation and regular dialysis schedule has been reinstated.  3 Diabetes mellitus, type 2: Patient has insulin-requiring type 2 diabetes mellitus. CBG was 66 on presentation, but came up to 80, while in the ED. As patient is clearly at high risk of hypoglycemia, given ESRD, insulin therapy and NPO status, she has been placed on dextrose infusion, albeit at low rate of 40cc/hr, to avoid fluid overload. All insulin is on hold, and CBGs have remained satisfactory. 4. HTN (hypertension): BP is currently ranging between 154/76-165/62. This is reasonable in the context of new CVA. Will avoid aggressive BP control.  5. Anemia: This moderate, normocytic and due to chronic disease. Following CBC.  6. Sacral decubitus ulcer: Patient has a rather larg, approximately 8 cm x 9 cm stage 4 sacral decubitus, and is reportedly, s/p recent debridement. Wound shows evidence of granulation, and does not appear overly infected. Utilizing mattress overlay. Patient has been evaluated by [facility], has undergone further sharp debridement on 08/04/12, as well as hydrotherapy. Managing as recommendation.  7. Recent pyelonephritis: Patient is reportedly, s/p recent hospitalization at [facility], for a pyelonephritis, for which shes was treated with Tobramycin, then Vancomycin. She is afebrile, and does not look toxic. Repeat urinalysis showed pyuria, but culture is showed no growth. .  8. FTT: It appears that patient has had FTT and poor oral intake, and that the question of possible PEG was discussed at SNF. Have discussed with daughter on 08/04/12, and after conferring with family, they have elected to proceed. IR has been consulted.    Code Status: Full Code.  Family Communication: Discussed in detail with daughter at bedside.  Disposition Plan: To be determined.    Brief  narrative: This is a 79 year old female nursing facility resident, with known history of ESRD on HD T-T-S, chronic anemia, DM, dyslipidemia, secondary hyperparathyroidism, HTN, PVD, s/p bilateral BKA, GERD, sacral decubitus s/p recent debridement,s/p recent hospitalization at [facility], for pyelonephritis, treated with Tobramycin, then discharged on Vancomycin with HD. Apparently, patient is s/p HD on 08/02/12, which she completed without incident, then en route to the nursing facility, she had an episode of unresponsiveness, associated with facial drooping, lasting for about 20 mins, before patient arrived at the nursing facility. She was then brought to the ED. Per ED MD, patient on arrival, was asymptomatic and at baseline mental status. She was admitted for further evaluation and management.    Consultants:  Dr Wyatt Portela, neurologist.  Dr Delano Metz, nephrologist.   Procedures:  Head CT scan.   CXR.   Antibiotics:  N/A.   HPI/Subjective: No new issues.   Objective: Vital signs in last 24 hours: Temp:  [98 F (36.7 C)-98.6 F (37 C)] 98.6 F (37 C) (01/19 1034) Pulse Rate:  [96-126] 113  (01/19 1034) Resp:  [16-25] 19  (01/19 1034) BP: (100-183)/(42-70) 182/58 mmHg (01/19 1034) SpO2:  [95 %-100 %] 98 % (01/19 1034) Weight:  [52.9 kg (116 lb 10 oz)-55.7 kg (122 lb 12.7 oz)] 52.9 kg (116 lb 10 oz) (01/18 1638) Weight change:     Intake/Output from previous day: 01/18 0701 - 01/19 0700 In: 360 [I.V.:360] Out: 3431 [Urine:1550]     Physical Exam: General: Comfortable. Not short of breath at rest.  HEENT: Moderate clinical pallor, no jaundice, no conjunctival injection or discharge. Buccal mucosa is dry, with "black hairy tongue".  NECK: Supple, JVP not seen, no carotid bruits, no palpable lymphadenopathy, no palpable goiter.  CHEST: Clinically clear to auscultation, no wheezes, no crackles.  HEART: Sounds 1 and 2 heard, normal, regular, no murmurs.   ABDOMEN: Full, soft, non-tender, no palpable organomegaly, no palpable masses, normal bowel sounds.  GENITALIA: Not examined.  LOWER EXTREMITIES: Unremarkable bilateral BKA stumps.  MUSCULOSKELETAL SYSTEM: Bilateral BKA, otherwise, generalized osteoarthritic changes, otherwise, no.  CENTRAL NERVOUS SYSTEM: There is currently no facial asymmetry, but patient has weakness of left arm grip.  Lab Results:  [facility] 08/05/12 0640 08/04/12 0550  WBC 8.3 8.4  HGB 7.8* 7.2*  HCT 26.0* 23.8*  PLT 288 269    Basename 08/05/12 0640 08/04/12 0550  NA 138 140  K 3.5 4.4  CL 99 100  CO2 27 24  GLUCOSE 102* 79  BUN 19 41*  CREATININE 3.50* 5.63*  CALCIUM 8.4 8.5   Recent Results (from the past 240 hour(s))  URINE CULTURE     Status: Normal   Collection Time   08/02/12  6:43 PM      Component Value Range Status Comment   Specimen Description URINE, CATHETERIZED   Final    Special Requests NONE   Final    Culture  Setup Time 08/03/2012 14:26   Final    Colony Count NO GROWTH   Final    Culture NO GROWTH   Final    Report Status 08/04/2012 FINAL   Final   MRSA PCR SCREENING     Status: Abnormal   Collection Time   08/02/12  9:45 PM      Component Value Range Status Comment   MRSA by PCR POSITIVE (*) NEGATIVE Final      Studies/Results: Mr Brain Wo Contrast  08/03/2012  *RADIOLOGY REPORT*  Clinical Data:  Unresponsiveness associated  with facial droop following hemodialysis.  Chronic renal failure.  MRI HEAD WITHOUT CONTRAST MRA HEAD WITHOUT CONTRAST  Technique:  Multiplanar, multiecho pulse sequences of the brain and surrounding structures were obtained without intravenous contrast. Angiographic images of the head were obtained using MRA technique without contrast.  Comparison:  Most recent CT head 08/02/2012.  MRI HEAD  Findings:  The patient had difficulty remaining motionless for the study.  Images are suboptimal.  Small or subtle lesions could be overlooked.  There is a subcentimeter  infarct affecting the left frontal white matter in a subcortical location, approximately 5 mm in diameter. No other areas of acute infarction are seen.  There is no   intracranial hemorrhage, mass lesion, hydrocephalus, or extra-axial fluid. There is advanced atrophy and extensive chronic microvascular ischemic change.  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  Tiny focus chronic hemorrhage left posterior temporal region.  Remote brainstem lacunar infarcts.  Remote left basal ganglia lacunar infarcts.  Extensive dural calcification. No scalp or osseous lesions.  No gross midline abnormality.  Chronic sinus disease.  No acute mastoid fluid. Compared with prior CT, this infarct is not visible.  IMPRESSION: Subcentimeter left frontal subcortical white matter infarct, approximately 5 mm in diameter.  No hemorrhage.  Advanced atrophy and extensive chronic microvascular ischemic change.  Remote ischemic events are stable.  MRA HEAD  Findings: There is a 50-75% stenosis of the supraclinoid right ICA. The left internal carotid artery shows mild nonstenotic irregularity in its supraclinoid segment.  Long segment 75-90% stenosis proximal right anterior cerebral artery.  Both distal ACAs fill from the left.  Mild nonstenotic irregularity of the proximal middle cerebral arteries bilaterally. Poor flow related enhancement of the distal MCA branches on the right is noted, reflecting previous right hemisphere ischemic event.  The basilar artery is widely patent with left vertebral dominant. Hypoplasia of the distal right vertebral relates to primarily supply of this vessel of the PICA.  There is mild irregularity of the distal PCA without proximal narrowing.  Cerebellar branches are poorly seen.  There is a 3 mm aneurysm projecting anteriorly from the right internal carotid artery at the ophthalmic origin.  A tiny inferior outpouching from the supraclinoid ICA on the left, no more than 1 mm  in size (image 73 series 5) could be atherosclerotic in nature, or arise from a paraophthalmic location.  IMPRESSION: Potentially flow reducing lesion supraclinoid internal carotid artery on the right, estimated 50-75% stenosis.Widespread intracranial atherosclerotic disease elsewhere.  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   Original Report Authenticated By: Davonna Belling, M.D.    Mr Mra Head/brain Wo Cm  08/03/2012  *RADIOLOGY REPORT*  Clinical Data:  Unresponsiveness associated with facial droop following hemodialysis.  Chronic renal failure.  MRI HEAD WITHOUT CONTRAST MRA HEAD WITHOUT CONTRAST  Technique:  Multiplanar, multiecho pulse sequences of the brain and surrounding structures were obtained without intravenous contrast. Angiographic images of the head were obtained using MRA technique without contrast.  Comparison:  Most recent CT head 08/02/2012.  MRI HEAD  Findings:  The patient had difficulty remaining motionless for the study.  Images are suboptimal.  Small or subtle lesions could be overlooked.  There is a subcentimeter infarct affecting the left frontal white matter in a subcortical location, approximately 5 mm in diameter. No other areas of acute infarction are seen.  There is no   intracranial hemorrhage, mass lesion, hydrocephalus, or  extra-axial fluid. There is advanced atrophy and extensive chronic microvascular ischemic change.  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  Tiny focus chronic hemorrhage left posterior temporal region.  Remote brainstem lacunar infarcts.  Remote left basal ganglia lacunar infarcts.  Extensive dural calcification. No scalp or osseous lesions.  No gross midline abnormality.  Chronic sinus disease.  No acute mastoid fluid. Compared with prior CT, this infarct is not visible.  IMPRESSION: Subcentimeter left frontal subcortical white matter infarct,  approximately 5 mm in diameter.  No hemorrhage.  Advanced atrophy and extensive chronic microvascular ischemic change.  Remote ischemic events are stable.  MRA HEAD  Findings: There is a 50-75% stenosis of the supraclinoid right ICA. The left internal carotid artery shows mild nonstenotic irregularity in its supraclinoid segment.  Long segment 75-90% stenosis proximal right anterior cerebral artery.  Both distal ACAs fill from the left.  Mild nonstenotic irregularity of the proximal middle cerebral arteries bilaterally. Poor flow related enhancement of the distal MCA branches on the right is noted, reflecting previous right hemisphere ischemic event.  The basilar artery is widely patent with left vertebral dominant. Hypoplasia of the distal right vertebral relates to primarily supply of this vessel of the PICA.  There is mild irregularity of the distal PCA without proximal narrowing.  Cerebellar branches are poorly seen.  There is a 3 mm aneurysm projecting anteriorly from the right internal carotid artery at the ophthalmic origin.  A tiny inferior outpouching from the supraclinoid ICA on the left, no more than 1 mm in size (image 73 series 5) could be atherosclerotic in nature, or arise from a paraophthalmic location.  IMPRESSION: Potentially flow reducing lesion supraclinoid internal carotid artery on the right, estimated 50-75% stenosis.Widespread intracranial atherosclerotic disease elsewhere.  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   Original Report Authenticated By: Davonna Belling, M.D.     Medications: Scheduled Meds:    . aspirin  300 mg Rectal Daily   Or  . aspirin  325 mg Oral Daily  . Chlorhexidine Gluconate Cloth  6 each Topical Q0600  . darbepoetin (ARANESP) injection - DIALYSIS  60 mcg Intravenous Q Sat-HD  . doxercalciferol  0.5 mcg Intravenous Q T,Th,Sa-HD  . heparin  5,000 Units Subcutaneous Q8H  . mupirocin  ointment  1 application Nasal BID  . sodium chloride  3 mL Intravenous Q12H   Continuous Infusions:    . dextrose 5 % and 0.9% NaCl 40 mL/hr at 08/04/12 0512   PRN Meds:.sodium chloride, acetaminophen, acetaminophen, LORazepam, ondansetron (ZOFRAN) IV, sodium chloride    LOS: 3 days   Linda [name],CHRISTOPHER  [facility] Pager [phone]. If 8PM-8AM, please contact night-coverage at [email or web address], password [email or web address] 08/05/2012, 12:00 PM  LOS: 3 days

## 2012-08-05 NOTE — Progress Notes (Signed)
Patient ID: Linda [name], female   DOB: 1931/06/25, 79 y.o.   MRN: [id number] Aware of request for percutaneous gastrostomy tube placement on pt with hx CVA/FTT. Will await for speech therapy/swallow eval before assessing for G tube.

## 2012-08-05 NOTE — Progress Notes (Signed)
Stroke Team Progress Note  HISTORY  Linda [name] is an 79 y.o. female patient who after HD on 08/03/12, while en route to the nursing facility,had an episode of unresponsiveness, associated with facial drooping, lasting about 20 mins, before patient arrived at the nursing facility. She was then brought to the ED. Per ED MD, patient on arrival, was asymptomatic and at baseline mental status. CT head showed a right parietal infarct. Neurology consulted for infarct. At time of consultation patient was nonverbal, followed no commands, clenched eyes tightly, moaned when UE were moved.  TPA was not given as she was outside of the therapeutic window. She was admitted  for further evaluation and treatment.  SUBJECTIVE The patient's daughter's at the bedside this morning. The patient's daughter stayed with her throughout the night. She feels that the patient is a little more "spunky" today. The patient continues to [facility] whenever touched.  OBJECTIVE Most recent Vital Signs: Filed Vitals:   08/04/12 1638 08/04/12 2200 08/05/12 0200 08/05/12 0600  BP: 137/65 178/61 157/65 183/62  Pulse: 113 103 109 102  Temp: 98.4 F (36.9 C) 98 F (36.7 C) 98.1 F (36.7 C) 98.4 F (36.9 C)  TempSrc: Axillary     Resp: 17 20 20 20   Weight: 52.9 kg (116 lb 10 oz)     SpO2:  100% 97% 100%   CBG (last 3)   Basename 08/05/12 0848 08/05/12 0417 08/04/12 2339  GLUCAP 106* 99 87    IV Fluid Intake:      . dextrose 5 % and 0.9% NaCl 40 mL/hr at 08/04/12 0512    MEDICATIONS     . aspirin  300 mg Rectal Daily   Or  . aspirin  325 mg Oral Daily  . Chlorhexidine Gluconate Cloth  6 each Topical Q0600  . darbepoetin (ARANESP) injection - DIALYSIS  60 mcg Intravenous Q Sat-HD  . doxercalciferol  0.5 mcg Intravenous Q T,Th,Sa-HD  . heparin  5,000 Units Subcutaneous Q8H  . mupirocin ointment  1 application Nasal BID  . sodium chloride  3 mL Intravenous Q12H   PRN:  sodium chloride, acetaminophen,  acetaminophen, LORazepam, ondansetron (ZOFRAN) IV, sodium chloride  Diet:  NPO  Activity:  Bedrest DVT Prophylaxis:  [place] Heparin  CLINICALLY SIGNIFICANT STUDIES Basic Metabolic Panel:   Lab 08/05/12 0640 08/04/12 0550  NA 138 140  K 3.5 4.4  CL 99 100  CO2 27 24  GLUCOSE 102* 79  BUN 19 41*  CREATININE 3.50* 5.63*  CALCIUM 8.4 8.5  MG -- --  PHOS 3.7 4.9*   Liver Function Tests:   Lab 08/05/12 0640 08/04/12 0550 08/02/12 1651  AST -- -- 14  ALT -- -- <5  ALKPHOS -- -- 106  BILITOT -- -- 0.1*  PROT -- -- 7.2  ALBUMIN 2.1* 1.9* --   CBC:   Lab 08/05/12 0640 08/04/12 0550 08/02/12 1651  WBC 8.3 8.4 --  NEUTROABS -- -- 13.1*  HGB 7.8* 7.2* --  HCT 26.0* 23.8* --  MCV 88.4 87.2 --  PLT 288 269 --   Coagulation: No results found for this basename: LABPROT:4,INR:4 in the last 168 hours Cardiac Enzymes:   Lab 08/02/12 1652  CKTOTAL --  CKMB --  CKMBINDEX --  TROPONINI <0.30   Urinalysis:   Lab 08/02/12 1843  COLORURINE YELLOW  LABSPEC 1.016  PHURINE 7.5  GLUCOSEU NEGATIVE  HGBUR MODERATE*  BILIRUBINUR NEGATIVE  KETONESUR NEGATIVE  PROTEINUR >300*  UROBILINOGEN 0.2  NITRITE NEGATIVE  LEUKOCYTESUR LARGE*  Lipid Panel    Component Value Date/Time   CHOL 109 08/03/2012 0650   TRIG 70 08/03/2012 0650   HDL 42 08/03/2012 0650   CHOLHDL 2.6 08/03/2012 0650   VLDL 14 08/03/2012 0650   LDLCALC 53 08/03/2012 0650   HgbA1C  Lab Results  Component Value Date   HGBA1C 6.5* 08/03/2012    Urine Drug Screen:   No results found for this basename: labopia,  cocainscrnur,  labbenz,  amphetmu,  thcu,  labbarb    Alcohol Level: No results found for this basename: ETH:2 in the last 168 hours  Ct Head Wo Contrast 08/02/2012 IMPRESSION: Infarct within the right parietal lobe which I suspect is subacute to chronic.  Atrophy, chronic small vessel disease.   Dg Chest Portable 1 View 08/02/12  IMPRESSION: Stable mild peribronchial thickening.    Mr Head/brain Wo  Cm 08/03/2012  IMPRESSION: Subcentimeter left frontal subcortical white matter infarct, approximately 5 mm in diameter.  No hemorrhage.  Advanced atrophy and extensive chronic microvascular ischemic change.  Remote ischemic events are stable.    MRA HEAD    IMPRESSION: Potentially flow reducing lesion supraclinoid internal carotid artery on the right, estimated 50-75% stenosis.Widespread intracranial atherosclerotic disease elsewhere.  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    2D Echocardiogram  The estimated ejection fraction was in the range of 65% to 70%. No obvious embolic source noted.  Carotid Doppler  Bilateral: No evidence of hemodynamically significant internal carotid artery stenosis. Vertebral artery flow is antegrade.   EKG  - ST rate 107 BPM  Therapy Recommendations No further PT or OT recommended. Speech therapy is still in evaluating the patient.  Physical Exam  - The patient is on contact isolation precautions. General: Patient is mumbling incoherently, but is alert and following simple commands. Not short of breath at rest.  HEENT: Moderate clinical pallor, no jaundice, no conjunctival injection or discharge.  NECK: Supple, JVP not seen, no carotid bruits, no palpable lymphadenopathy, no palpable goiter.  CHEST: Clinically clear to auscultation, no wheezes, no crackles.  HEART: Sounds 1 and 2 heard, normal, regular, mildly tachycardic, no murmurs.  ABDOMEN: Full, soft, non-tender, no palpable organomegaly, no palpable masses, normal bowel sounds.  GENITALIA: Not examined.  LOWER EXTREMITIES: Unremarkable bilateral BKA stumps.  MUSCULOSKELETAL SYSTEM: Bilateral BKA, otherwise, generalized osteoarthritic changes, otherwise, no.  CENTRAL NERVOUS SYSTEM:  Drowsy but arouses easily. Dysarthric speech can barely be understood.exam limited as she is not very cooperative. Right lower face weak. Bilateral  leg weakness with amputation.right arm spastic weakness with wrist and hand flexion contracture.   ASSESSMENT Linda [name] is a 79 y.o. female presenting with unresponsiveness . No TPA was given as the patient was outside of the therapeutic window. Imaging confirms a left frontal subcortical white matter infarct, approximately 5 mm in diameter. Infarct felt to be thrombotic secondary to small vessel disease.  Work up completed except speech therapy and swallowing. On aspirin 81 mg orally every day prior to admission. Now on aspirin suppository 300 mg daily for secondary stroke prevention. Patient with resultant decreased level of responsiveness and right hemiplegia.   End stage renal disease  Bilateral BKAs  Hyperlipidemia hx - Chol 109 LDL 53  Hypertension  Syncope  Diabetes mellitus HgbA1C - 6.5  Anemia  Hospital day # 3  TREATMENT/PLAN  Continue aspirin 325 mg orally every day for secondary stroke prevention.  Await further speech therapy evaluation of swallowing.  Will probably need feeding tube. Peg has been discussed.  Continue wound care  Disposition - skilled nursing facility.  Not much role for stroke team at this time. Hence will sign off  Hassel Neth [facility] Pager [phone] 08/05/2012, 9:42 AM  I have personally obtained a history , evaluated imaging results, and formulated the assessment and plan of care. I agree with the above. Delia Heady, MD Medical Director [facility] Pager: [phone] 08/05/2012 12:57 PM

## 2012-08-05 NOTE — Evaluation (Signed)
Clinical/Bedside Swallow Evaluation Patient Details  Name: Linda [name] MRN: [id number] Date of Birth: February 11, 1931  Today's Date: 08/05/2012 Time: 1600-1630 SLP Time Calculation (min): 30 min  Past Medical History:  Past Medical History  Diagnosis Date  . Syncope   . End stage kidney disease   . Hyperlipidemia   . Anemia   . Hypertension   . 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 Diabetes mellitus   . GERD (gastroesophageal reflux disease)   . Secondary hyperparathyroidism   . PVD (peripheral vascular disease)    Past Surgical History: History reviewed. No pertinent past surgical history. HPI:  This is a 79 year old female nursing facility resident, with known history of ESRD on HD T-T-S, chronic anemia, DM, dyslipidemia, secondary hyperparathyroidism, HTN, PVD, s/p bilateral BKA, GERD, sacral decubitus s/p recent debridement,s/p recent hospitalization at [facility], for pyelonephritis, treated with Tobramycin, then discharged on Vancomycin with HD. Apparently, patient is s/p HD today, which she completed without incident, then en route to the nursing facility, she had an episode of unresponsiveness, associated with facial drooping, lasting for about 20 mins, before patient arrived at the nursing facility. She was then brought to the ED. Per ED MD, patient on arrival, was asymptomatic and at baseline mental status.    Assessment / Plan / Recommendation Clinical Impression  Moderate sensory motor oropharyngeal dysphagia indicated with +s/s of penetration vs. aspiration s/p swallow of all liquid consistencies.  Wet vocal quality s/p swallow of all liquids.  Initiation of swallow inconsistent throughout evaluation with patient vocalizing during swallow on many consistencies.  Patient tolerated puree consistency with no ouward s/s of aspiration but noted with diffuse oral residue .  Recommend to proceed with dysphagia 1  (puree) diet  consistency with pudding thick liquids ( no true liquids) with full supervision with all meals due to cognitive deficits.  Recommend to complete oral care with oral suction prior to meals and s/p meal due to noted oral residue.   Diagnostic treatment completed focusing on providing nursing with skilled education on recommended  strict aspiration precautions and swallow strategies to decrease risk of aspiration.   ST to follow in acute care setting for diet tolerance and possible advancement.  Completion of MBS to be determined.       Aspiration Risk  Moderate    Diet Recommendation Dysphagia 1 (Puree);Pudding-thick liquid;No mixed consistencies   Medication Administration: Crushed with puree Supervision: Full supervision/cueing for compensatory strategies Compensations: Slow rate;Small sips/bites;Check for pocketing;Clear throat intermittently Postural Changes and/or Swallow Maneuvers: Seated upright 90 degrees;Upright 30-60 min after meal    Other  Recommendations Oral Care Recommendations: Oral care before and after PO Other Recommendations: Prohibited food (jello, ice cream, thin soups);Order thickener from pharmacy;Remove water pitcher;Have oral suction available;Clarify dietary restrictions   Follow Up Recommendations  Skilled Nursing facility    Frequency and Duration min 2x/week  2 weeks       SLP Swallow Goals Patient will consume recommended diet without observed clinical signs of aspiration with: Total assistance Patient will utilize recommended strategies during swallow to increase swallowing safety with: Total assistance   Swallow Study Prior Functional Status   resident of SNF    General Date of Onset: 08/02/12 HPI: This is a 79 year old female nursing facility resident, with known history of ESRD on HD T-T-S, chronic anemia, DM, dyslipidemia, secondary hyperparathyroidism, HTN, PVD, s/p bilateral BKA, GERD, sacral decubitus s/p recent debridement,s/p recent  hospitalization at [facility],  for pyelonephritis, treated with Tobramycin, then discharged on Vancomycin with HD. Apparently, patient is s/p HD today, which she completed without incident, then en route to the nursing facility, she had an episode of unresponsiveness, associated with facial drooping, lasting for about 20 mins, before patient arrived at the nursing facility. She was then brought to the ED. Per ED MD, patient on arrival, was asymptomatic and at baseline mental status.  Type of Study: Bedside swallow evaluation Diet Prior to this Study: NPO Temperature Spikes Noted: No Respiratory Status: Room air History of Recent Intubation: No Behavior/Cognition: Alert;Confused;Agitated;Uncooperative;Distractible;Requires cueing;Hard of hearing Oral Cavity - Dentition: Edentulous Self-Feeding Abilities: Total assist Patient Positioning: Upright in bed Baseline Vocal Quality: Hoarse;Low vocal intensity Volitional Cough: Cognitively unable to elicit Volitional Swallow: Unable to elicit    Oral/Motor/Sensory Function Overall Oral Motor/Sensory Function: Impaired Labial ROM: Reduced right Labial Symmetry: Abnormal symmetry right Labial Strength: Reduced Labial Sensation: Reduced Lingual ROM: Reduced right Lingual Symmetry: Abnormal symmetry right Lingual Strength: Reduced Lingual Sensation: Reduced Facial ROM: Reduced right Facial Symmetry: Right droop Facial Strength: Reduced Facial Sensation: Reduced Velum: Within Functional Limits Mandible: Within Functional Limits   Ice Chips Ice chips: Impaired Presentation: Spoon Oral Phase Impairments: Reduced labial seal;Reduced lingual movement/coordination Oral Phase Functional Implications: Prolonged oral transit Pharyngeal Phase Impairments: Suspected delayed Swallow;Decreased hyoid-laryngeal movement;Wet Vocal Quality   Thin Liquid Thin Liquid: Impaired Presentation: Spoon Oral Phase Impairments: Reduced labial seal;Reduced  lingual movement/coordination Oral Phase Functional Implications: Prolonged oral transit Pharyngeal  Phase Impairments: Suspected delayed Swallow;Wet Vocal Quality;Multiple swallows;Decreased hyoid-laryngeal movement    Nectar Thick Nectar Thick Liquid: Impaired Presentation: Spoon Oral phase functional implications: Prolonged oral transit;Oral residue Pharyngeal Phase Impairments: Suspected delayed Swallow;Decreased hyoid-laryngeal movement;Wet Vocal Quality;Multiple swallows   Honey Thick Honey Thick Liquid: Impaired Presentation: Spoon Oral Phase Functional Implications: Prolonged oral transit;Oral residue Pharyngeal Phase Impairments: Decreased hyoid-laryngeal movement;Suspected delayed Swallow;Wet Vocal Quality   Puree Puree: Impaired Presentation: Spoon Oral Phase Functional Implications: Prolonged oral transit;Oral residue Pharyngeal Phase Impairments: Suspected delayed Swallow;Decreased hyoid-laryngeal movement   Solid   GO    Solid: Not tested      Moreen Fowler MS, CCC-SLP [phone] [facility] 08/05/2012,4:44 PM

## 2012-08-05 NOTE — Progress Notes (Signed)
Patient ID: Linda [name], female   DOB: 1930-08-07, 79 y.o.   MRN: [id number]   [facility] Progress Note    Subjective:   No acute events overnight- daughter at bedside reports no emerging concerns   Objective:   BP 183/62  Pulse 102  Temp 98.4 F (36.9 C) (Axillary)  Resp 20  Wt 52.9 kg (116 lb 10 oz)  SpO2 100%  Physical Exam: Gen: appears uncomfortable resting in bed WUJ:WJXBJ regular tachycardia, normal S1 and S2  Resp:Transmitted/coarse BS, no rales/rhonchi YNW:GNFA, flat, NT Ext:S/P bilateral BKA  Labs: BMET  Lab 08/05/12 0640 08/04/12 0550 08/03/12 0650 08/02/12 1651  NA 138 140 138 137  K 3.5 4.4 3.9 3.8  CL 99 100 100 96  CO2 27 24 26 26   GLUCOSE 102* 79 117* 81  BUN 19 41* 37* 34*  CREATININE 3.50* 5.63* 4.92* 4.39*  ALB -- -- -- --  CALCIUM 8.4 8.5 8.3* 8.6  PHOS 3.7 4.9* 4.2 --   CBC  Lab 08/05/12 0640 08/04/12 0550 08/03/12 0650 08/02/12 1651  WBC 8.3 8.4 12.3* 15.4*  NEUTROABS -- -- -- 13.1*  HGB 7.8* 7.2* 7.8* 8.4*  HCT 26.0* 23.8* 25.6* 27.6*  MCV 88.4 87.2 86.2 86.3  PLT 288 269 229 280    Medications:      . aspirin  300 mg Rectal Daily   Or  . aspirin  325 mg Oral Daily  . Chlorhexidine Gluconate Cloth  6 each Topical Q0600  . darbepoetin (ARANESP) injection - DIALYSIS  60 mcg Intravenous Q Sat-HD  . doxercalciferol  0.5 mcg Intravenous Q T,Th,Sa-HD  . heparin  5,000 Units Subcutaneous Q8H  . mupirocin ointment  1 application Nasal BID  . sodium chloride  3 mL Intravenous Q12H     Assessment/ Plan:   1. Altered mental status/facial droop- subacute vs chronic CVA on CT scan. Ongoing further workup/management per neurology recommendations. Overall prognosis unfortunate seems to be poor in this woman with a poor functional status/pre-existing dementia. Plans for possible PEG tube reported by daughter due to challenges with nutrition/swallowing 2. End-stage renal disease on hemodialysis: Plan for HD Tuesday- poor  functional status/prognosis/QOL  3. Chronic anemia-underlying ESRD as well as resistance to ESA from underlying wound/infection/ongoing blood loss.  4. Metabolic bone disease: Continue Hectorol for PTH management, currently n.p.o.-off phosphorus binders pending swallow evaluation.  5. Sacral decubitus: Ongoing aggressive wound care/debridement.   Zetta Bills, MD 08/05/2012, 9:25 AM

## 2012-08-06 ENCOUNTER — Encounter (HOSPITAL_COMMUNITY): Payer: Self-pay | Admitting: Radiology

## 2012-08-06 ENCOUNTER — Inpatient Hospital Stay (HOSPITAL_COMMUNITY): Payer: Medicare Other

## 2012-08-06 LAB — GLUCOSE, CAPILLARY
Glucose-Capillary: 148 mg/dL — ABNORMAL HIGH (ref 70–99)
Glucose-Capillary: 179 mg/dL — ABNORMAL HIGH (ref 70–99)
Glucose-Capillary: 234 mg/dL — ABNORMAL HIGH (ref 70–99)
Glucose-Capillary: 238 mg/dL — ABNORMAL HIGH (ref 70–99)

## 2012-08-06 LAB — RENAL FUNCTION PANEL
Albumin: 2 g/dL — ABNORMAL LOW (ref 3.5–5.2)
CO2: 26 mEq/L (ref 19–32)
Chloride: 104 mEq/L (ref 96–112)
GFR calc Af Amer: 10 mL/min — ABNORMAL LOW (ref >90)
GFR calc non Af Amer: 8 mL/min — ABNORMAL LOW (ref >90)
Potassium: 3.5 mEq/L (ref 3.5–5.1)
Sodium: 143 mEq/L (ref 135–145)

## 2012-08-06 LAB — CBC
HCT: 24.8 % — ABNORMAL LOW (ref 36.0–46.0)
Hemoglobin: 7.6 g/dL — ABNORMAL LOW (ref 12.0–15.0)
MCHC: 30.6 g/dL (ref 30.0–36.0)
RBC: 2.86 MIL/uL — ABNORMAL LOW (ref 3.87–5.11)

## 2012-08-06 LAB — APTT: aPTT: 51 seconds — ABNORMAL HIGH (ref 24–37)

## 2012-08-06 MED ORDER — DARBEPOETIN ALFA-POLYSORBATE 100 MCG/0.5ML IJ SOLN
100.0000 ug | INTRAMUSCULAR | Status: DC
  Administered 2012-08-11: 100 ug via INTRAVENOUS
  Filled 2012-08-06: qty 0.5

## 2012-08-06 MED ORDER — HEPARIN SODIUM (PORCINE) 5000 UNIT/ML IJ SOLN
5000.0000 [IU] | Freq: Three times a day (TID) | INTRAMUSCULAR | Status: DC
  Administered 2012-08-07 – 2012-08-08 (×2): 5000 [IU] via SUBCUTANEOUS
  Filled 2012-08-06 (×9): qty 1

## 2012-08-06 MED ORDER — RESOURCE THICKENUP CLEAR PO POWD
ORAL | Status: DC | PRN
  Filled 2012-08-06: qty 125

## 2012-08-06 MED ORDER — CEFAZOLIN SODIUM 1-5 GM-% IV SOLN
1.0000 g | Freq: Once | INTRAVENOUS | Status: AC
  Administered 2012-08-09: 1 g via INTRAVENOUS
  Filled 2012-08-06 (×2): qty 50

## 2012-08-06 NOTE — Progress Notes (Signed)
1 cup of barium given per NG tube per order. Pt tolerated well, will continue to monitor.  Harless Litten, RN 08/06/12

## 2012-08-06 NOTE — Progress Notes (Signed)
NG tube placed through the Lt nares and confirmed with  DG abd. Pt tolerates well. Consent for the procedure is signed and in the chart. No concerns.

## 2012-08-06 NOTE — H&P (Signed)
Linda [name] is an 79 y.o. female.   Chief Complaint: CVA; AMS; sacral decub ulcer - non healing FTT; needs nutrition Request per Dr Brien Few for percutaneous gastric tube placement in IR Dr Brien Few has discussed with family and has their consent. I have been unable to reach them for a consent for thr procedure Consent is in chart- RN to get via phone HPI: Syncope; ESRD; HLD; HTN; B BKA; DM; PVD FTT; nonhealing wounds  Past Medical History  Diagnosis Date  . Syncope   . End stage kidney disease   . Hyperlipidemia   . Anemia   . Hypertension   . 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 Diabetes mellitus   . GERD (gastroesophageal reflux disease)   . Secondary hyperparathyroidism   . PVD (peripheral vascular disease)     History reviewed. No pertinent past surgical history.  No family history on file. Social History:  reports that she has never smoked. She has never used smokeless tobacco. She reports that she does not drink alcohol or use illicit drugs.  Allergies:  Allergies  Allergen Reactions  . Percocet (Oxycodone-Acetaminophen)     Medications Prior to Admission  Medication Sig Dispense Refill  . amLODipine (NORVASC) 10 MG tablet Take 10 mg by mouth daily.      Marland Kitchen aspirin 81 MG tablet Take 81 mg by mouth daily.      Marland Kitchen b complex-vitamin c-folic acid (NEPHRO-VITE) 0.8 MG TABS Take 0.8 mg by mouth daily.       . bisacodyl (DULCOLAX) 10 MG suppository Place 10 mg rectally daily.      . cloNIDine (CATAPRES - DOSED IN MG/24 HR) 0.3 mg/24hr Place 1 patch onto the skin once a week. On Mondays      . diltiazem (CARDIZEM CD) 240 MG 24 hr capsule Take 240 mg by mouth daily.      Marland Kitchen HYDROcodone-acetaminophen (NORCO/VICODIN) 5-325 MG per tablet Take 1 tablet by mouth 3 (three) times daily as needed. For pain      . insulin detemir (LEVEMIR) 100 UNIT/ML injection Inject 10 Units into the skin at bedtime.      . insulin lispro (HUMALOG) 100 UNIT/ML  injection Inject 5 Units into the skin See admin instructions. on Tues, Thurs and Sat.  (dialysis days) patient gets 5 units at 1430.  On all other days patient gets 5 units at 1130 before lunch.      . iron polysaccharides (NIFEREX) 150 MG capsule Take 150 mg by mouth daily.      . mirtazapine (REMERON) 15 MG tablet Take 15 mg by mouth at bedtime.      . ondansetron (ZOFRAN) 4 MG tablet Take 4 mg by mouth every 12 (twelve) hours as needed. For nausea/vomiting      . ondansetron (ZOFRAN) 4 MG tablet Take 4 mg by mouth 3 (three) times a week. Tues Thurs Sat (Dialysis days)      . PARoxetine (PAXIL) 20 MG tablet Take 20 mg by mouth every morning.      . polyethylene glycol (MIRALAX / GLYCOLAX) packet Take 17 g by mouth 2 (two) times daily.      Marland Kitchen PRESCRIPTION MEDICATION Apply 1 mL topically daily as needed. Ativan 1mg /ml topical gel: For agitation after dialysis      . ranitidine (ZANTAC) 150 MG tablet Take 150 mg by mouth at bedtime.      . risperiDONE (RISPERDAL) 0.5 MG tablet Take 0.5 mg by mouth 2 (  two) times daily.      . simvastatin (ZOCOR) 10 MG tablet Take 10 mg by mouth daily.       . vitamin C (ASCORBIC ACID) 500 MG tablet Take 500 mg by mouth 3 (three) times daily.      . WHEY PROTEIN PO Take 35 mLs by mouth 2 (two) times daily. Mixed in vanilla pudding      . zinc sulfate (ZINC-220) 220 MG capsule Take 220 mg by mouth daily.        Results for orders placed during the hospital encounter of 08/02/12 (from the past 48 hour(s))  GLUCOSE, CAPILLARY     Status: Normal   Collection Time   08/04/12 12:37 PM      Component Value Range Comment   Glucose-Capillary 81  70 - 99 mg/dL   HEPATITIS B SURFACE ANTIGEN     Status: Normal   Collection Time   08/04/12  2:12 PM      Component Value Range Comment   Hepatitis B Surface Ag NEGATIVE  NEGATIVE   HEPATITIS B CORE ANTIBODY, TOTAL     Status: Normal   Collection Time   08/04/12  2:12 PM      Component Value Range Comment   Hep B Core Total  Ab NEGATIVE  NEGATIVE   HEPATITIS B SURFACE ANTIBODY     Status: Abnormal   Collection Time   08/04/12  2:12 PM      Component Value Range Comment   Hep B S Ab Reactive (*) NONREACTIVE   GLUCOSE, CAPILLARY     Status: Normal   Collection Time   08/04/12  4:52 PM      Component Value Range Comment   Glucose-Capillary 94  70 - 99 mg/dL   GLUCOSE, CAPILLARY     Status: Normal   Collection Time   08/04/12  6:40 PM      Component Value Range Comment   Glucose-Capillary 88  70 - 99 mg/dL   GLUCOSE, CAPILLARY     Status: Normal   Collection Time   08/04/12  7:53 PM      Component Value Range Comment   Glucose-Capillary 89  70 - 99 mg/dL   GLUCOSE, CAPILLARY     Status: Normal   Collection Time   08/04/12 11:39 PM      Component Value Range Comment   Glucose-Capillary 87  70 - 99 mg/dL   GLUCOSE, CAPILLARY     Status: Normal   Collection Time   08/05/12  4:17 AM      Component Value Range Comment   Glucose-Capillary 99  70 - 99 mg/dL   CBC     Status: Abnormal   Collection Time   08/05/12  6:40 AM      Component Value Range Comment   WBC 8.3  4.0 - 10.5 K/uL    RBC 2.94 (*) 3.87 - 5.11 MIL/uL    Hemoglobin 7.8 (*) 12.0 - 15.0 g/dL    HCT 78.2 (*) 95.6 - 46.0 %    MCV 88.4  78.0 - 100.0 fL    MCH 26.5  26.0 - 34.0 pg    MCHC 30.0  30.0 - 36.0 g/dL    RDW 21.3 (*) 08.6 - 15.5 %    Platelets 288  150 - 400 K/uL   RENAL FUNCTION PANEL     Status: Abnormal   Collection Time   08/05/12  6:40 AM      Component Value Range Comment  Sodium 138  135 - 145 mEq/L    Potassium 3.5  3.5 - 5.1 mEq/L    Chloride 99  96 - 112 mEq/L    CO2 27  19 - 32 mEq/L    Glucose, Bld 102 (*) 70 - 99 mg/dL    BUN 19  6 - 23 mg/dL    Creatinine, Ser 1.61 (*) 0.50 - 1.10 mg/dL DELTA CHECK NOTED   Calcium 8.4  8.4 - 10.5 mg/dL    Phosphorus 3.7  2.3 - 4.6 mg/dL    Albumin 2.1 (*) 3.5 - 5.2 g/dL    GFR calc non Af Amer 11 (*) >90 mL/min    GFR calc Af Amer 13 (*) >90 mL/min   GLUCOSE, CAPILLARY      Status: Abnormal   Collection Time   08/05/12  8:48 AM      Component Value Range Comment   Glucose-Capillary 106 (*) 70 - 99 mg/dL   GLUCOSE, CAPILLARY     Status: Normal   Collection Time   08/05/12 12:04 PM      Component Value Range Comment   Glucose-Capillary 98  70 - 99 mg/dL   GLUCOSE, CAPILLARY     Status: Abnormal   Collection Time   08/05/12  4:14 PM      Component Value Range Comment   Glucose-Capillary 113 (*) 70 - 99 mg/dL   GLUCOSE, CAPILLARY     Status: Abnormal   Collection Time   08/05/12  8:07 PM      Component Value Range Comment   Glucose-Capillary 136 (*) 70 - 99 mg/dL    Comment 1 Notify RN     GLUCOSE, CAPILLARY     Status: Abnormal   Collection Time   08/06/12 12:02 AM      Component Value Range Comment   Glucose-Capillary 179 (*) 70 - 99 mg/dL    Comment 1 Notify RN     GLUCOSE, CAPILLARY     Status: Abnormal   Collection Time   08/06/12  4:48 AM      Component Value Range Comment   Glucose-Capillary 148 (*) 70 - 99 mg/dL    Comment 1 Notify RN     CBC     Status: Abnormal   Collection Time   08/06/12  6:52 AM      Component Value Range Comment   WBC 6.6  4.0 - 10.5 K/uL    RBC 2.86 (*) 3.87 - 5.11 MIL/uL    Hemoglobin 7.6 (*) 12.0 - 15.0 g/dL    HCT 09.6 (*) 04.5 - 46.0 %    MCV 86.7  78.0 - 100.0 fL    MCH 26.6  26.0 - 34.0 pg    MCHC 30.6  30.0 - 36.0 g/dL    RDW 40.9 (*) 81.1 - 15.5 %    Platelets 266  150 - 400 K/uL   RENAL FUNCTION PANEL     Status: Abnormal   Collection Time   08/06/12  6:52 AM      Component Value Range Comment   Sodium 143  135 - 145 mEq/L    Potassium 3.5  3.5 - 5.1 mEq/L    Chloride 104  96 - 112 mEq/L    CO2 26  19 - 32 mEq/L    Glucose, Bld 151 (*) 70 - 99 mg/dL    BUN 25 (*) 6 - 23 mg/dL    Creatinine, Ser 9.14 (*) 0.50 - 1.10 mg/dL    Calcium  8.2 (*) 8.4 - 10.5 mg/dL    Phosphorus 4.5  2.3 - 4.6 mg/dL    Albumin 2.0 (*) 3.5 - 5.2 g/dL    GFR calc non Af Amer 8 (*) >90 mL/min    GFR calc Af Amer 10 (*) >90  mL/min   GLUCOSE, CAPILLARY     Status: Abnormal   Collection Time   08/06/12  8:11 AM      Component Value Range Comment   Glucose-Capillary 144 (*) 70 - 99 mg/dL    No results found.  Review of Systems  Constitutional: Positive for weight loss. Negative for fever and chills.  Respiratory: Positive for shortness of breath.   Cardiovascular: Negative for chest pain.  Gastrointestinal: Negative for nausea and vomiting.  Neurological: Positive for weakness. Negative for headaches.    Blood pressure 125/56, pulse 121, temperature 98.3 F (36.8 C), temperature source Axillary, resp. rate 18, weight 116 lb 10 oz (52.9 kg), SpO2 96.00%. Physical Exam  Cardiovascular: Normal rate, regular rhythm and normal heart sounds.   No murmur heard. Respiratory: Effort normal. She has wheezes.  GI: Soft. She exhibits no distension. There is no tenderness.  Musculoskeletal:       B BKA Not following commands Pt moaning  Neurological:       Not really communicating coherently  Skin:       Sacral decub ulcer  Psychiatric:       Need to consent family Unable to reach by phone     Assessment/Plan CVA; FTT Non healing sacral decub Needs nutrition ESRD; DM Scheduled for percutaneous Gastric tube placement in IR 1/21 pts family aware and agreeable per Dr Brien Few note Not able to reach for consent myself RN will continue to try - check in am Ancef ordered Hold Hep Inj Barium ordered Check KUB in am  Harlin Mazzoni A 08/06/2012, 11:21 AM

## 2012-08-06 NOTE — Progress Notes (Signed)
S: Moans, keeps eyes closed O:BP 164/53  Pulse 86  Temp 97.9 F (36.6 C) (Axillary)  Resp 16  Wt 52.9 kg (116 lb 10 oz)  SpO2 98%  Intake/Output Summary (Last 24 hours) at 08/06/12 0807 Last data filed at 08/06/12 0300  Gross per 24 hour  Intake      0 ml  Output    375 ml  Net   -375 ml   Weight change:  ZOX:WRUEA but eyes closed CVS:RRR Resp:clear ant Abd:+ BS NTND Ext:Bil BKA, Rt AVG + bruit NEURO:Might answer very simple question otherwise non engaging      . aspirin  300 mg Rectal Daily   Or  . aspirin  325 mg Oral Daily  . Chlorhexidine Gluconate Cloth  6 each Topical Q0600  . darbepoetin (ARANESP) injection - DIALYSIS  60 mcg Intravenous Q Sat-HD  . doxercalciferol  0.5 mcg Intravenous Q T,Th,Sa-HD  . heparin  5,000 Units Subcutaneous Q8H  . influenza  inactive virus vaccine  0.5 mL Intramuscular Tomorrow-1000  . mupirocin ointment  1 application Nasal BID  . pneumococcal 23 valent vaccine  0.5 mL Intramuscular Tomorrow-1000  . sodium chloride  3 mL Intravenous Q12H   No results found. BMET    Component Value Date/Time   NA 138 08/05/2012 0640   K 3.5 08/05/2012 0640   CL 99 08/05/2012 0640   CO2 27 08/05/2012 0640   GLUCOSE 102* 08/05/2012 0640   BUN 19 08/05/2012 0640   CREATININE 3.50* 08/05/2012 0640   CALCIUM 8.4 08/05/2012 0640   GFRNONAA 11* 08/05/2012 0640   GFRAA 13* 08/05/2012 0640   CBC    Component Value Date/Time   WBC 6.6 08/06/2012 0652   RBC 2.86* 08/06/2012 0652   HGB 7.6* 08/06/2012 0652   HCT 24.8* 08/06/2012 0652   PLT 266 08/06/2012 0652   MCV 86.7 08/06/2012 0652   MCH 26.6 08/06/2012 0652   MCHC 30.6 08/06/2012 0652   RDW 18.5* 08/06/2012 0652   LYMPHSABS 1.5 08/02/2012 1651   MONOABS 0.7 08/02/2012 1651   EOSABS 0.0 08/02/2012 1651   BASOSABS 0.0 08/02/2012 1651     Assessment:  1. Acute strike 2. Anemia 3. Sec HPTH 4. ESRD  Plan: 1. Increase aranesp 2. HD in AM 3. If mental status does not improve, then one has to question  the value of continued HD.   Daleyssa Loiselle T

## 2012-08-06 NOTE — Progress Notes (Signed)
PT HYDROTHERAPY PROGRESS NOTE  08/06/12 1200  Subjective Assessment  Subjective "water"  Date of Onset (prior to admission)  Wound 08/02/12 Other (Comment) Back Mid;Lower  Date First Assessed/Time First Assessed: 08/02/12 1820   Wound Type: (c) Other (Comment)  Location: Back  Location Orientation: Mid;Lower  Present on Admission: Yes  Site / Wound Assessment Pink;Yellow;Black;Brown;Dusky;Granulation tissue;Red  % Wound base Red or Granulating 65%  % Wound base Yellow 20%  % Wound base Black 10%  % Wound base Other (Comment) 5% (bone chips)  Peri-wound Assessment Pink;Maceration;Erythema (blanchable)  Margins Unattacted edges (unapproximated)  Closure None  Drainage Amount Minimal  Drainage Description Serosanguineous  Non-staged Wound Description Full thickness  Treatment Hydrotherapy (Pulse lavage);Packing (Saline gauze);Tape changed  Dressing Type ABD;Other (Comment);Barrier Film (skin prep);Gauze (Comment)  Dressing Changed Changed  Dressing Status Clean;Dry;Intact  Hydrotherapy  Pulsed Lavage with Suction (psi) 4 psi  Pulsed Lavage with Suction - Normal Saline Used 1000 mL  Pulsed Lavage Tip Tip with splash shield  Pulsed lavage therapy - wound location Sacral wound  Selective Debridement  Selective Debridement - Location sacral wound  Selective Debridement - Tools Used Forceps;Scalpel;Scissors  Selective Debridement - Tissue Removed smal area of black eschar removed as well as multiple sites of yellow eschar  Wound Therapy - Assess/Plan/Recommendations  Wound Therapy - Clinical Statement Wound appears with less necrotic tissue in wound bed today. Wound responding well to healing with larger portions of healther granulated tissue visable.  Lareger amounts of drainage compared to prior session. Will continue hydrotherapy with debridement as idicated.  Wound Therapy - Functional Problem List decreased skin integrity, decreased functional mobility  Factors Delaying/Impairing  Wound Healing Altered sensation;Diabetes Mellitus;Multiple medical problems;Vascular compromise;Infection - systemic/local  Hydrotherapy Plan Debridement;Patient/family education;Pulsatile lavage with suction  Wound Therapy - Frequency 6X / week  Wound Plan Will see 6x a week as indicated with PLS, selective debridement and dressing changes  Wound Therapy Goals - Improve the function of patient's integumentary system by progressing the wound(s) through the phases of wound healing by:  Decrease Necrotic Tissue - Progress Progressing toward goal  Increase Granulation Tissue - Progress Progressing toward goal  Improve Drainage Characteristics - Progress Progressing toward goal  Time For Goal Achievement 7 days  Wound Therapy - Potential for Goals Fair   Charlotte Crumb, PT DPT  [phone]

## 2012-08-06 NOTE — Clinical Social Work Note (Signed)
Clinical Social Work   CSW spoke with admissions Nurse, adult at [facility]. Pt will be able to return when medically ready. CSW will update FL2 and send SNF updated clinicals. CSW will continue to follow.   Dede Query, MSW, Theresia Majors [phone]

## 2012-08-06 NOTE — Progress Notes (Signed)
[facility] PROGRESS NOTE  Linda [name] ZOX:[id number] DOB: [birth year] DOA: 08/02/2012 PCP: Colon Branch, MD  Assessment/Plan: Active Problems:  Altered mental status  Syncope  ESRD (end stage renal disease)  Diabetes mellitus, type 2  HTN (hypertension)  Anemia  Sacral decubitus ulcer  FTT (failure to thrive) in adult  CVA (cerebral infarction)    1. Altered mental status/CVA: Patient presented with what was described as an episode of very poor responsiveness, associated with facial droop, following HD. Clinically, she does have some weakness of left upper extremity. Head CT scan revealed an infarct within the right parietal lobe which may be subacute to chronic. Patient reportedly, was last seen normal at 2:15 PM on 08/02/12, so she was outside the window for t-PA, and symptoms had improved on arrival, although her other co-morbidities, including a moderate anemia, may also have been contraindications. Dr Wyatt Portela provided neurology consultation and CVA w/u, carried out. Brain MRI showed subcentimeter left frontal subcortical white matter infarct, approximately 5 mm in diameter. No hemorrhage. Advanced atrophy and extensive chronic microvascular ischemic change. Remote ischemic events are stable. MRA revealed potentially flow reducing lesion supraclinoid internal carotid artery on the right, estimated 50-75% stenosis.Widespread intracranial atherosclerotic disease elsewhere. 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 Carotid doppler showed no evidence of hemodynamically significant internal carotid artery stenosis. Vertebral artery flow is antegrade. 2D Echocardiogram revealed normal LV cavity size, mild LVH and EF of 65% to 70%. Continued on anti-platelet medication. EKG shows SR, with old Q-waves in V1-V2, but no acute ischemic changes. SLP has evaluated and recommended D1/Pudding thick.  2. ESRD  (end stage renal disease): Patient is on HD T-T-S, and is s/p hemodialysis on 08/02/12. Dr Delano Metz provided nephrology consultation and regular dialysis schedule has been reinstated.  3 Diabetes mellitus, type 2: Patient has insulin-requiring type 2 diabetes mellitus. CBG was 66 on presentation, but came up to 80, while in the ED. As patient is clearly at high risk of hypoglycemia, given ESRD, insulin therapy and NPO status, she has been placed on dextrose infusion, albeit at low rate of 40cc/hr, to avoid fluid overload. All insulin is on hold, and CBGs have remained satisfactory. 4. HTN (hypertension): BP is currently ranging between 154/76-165/62. This is reasonable in the context of new CVA. Will avoid aggressive BP control.  5. Anemia: This moderate, normocytic and due to chronic disease. Following CBC.  6. Sacral decubitus ulcer: Patient has a rather larg, approximately 8 cm x 9 cm stage 4 sacral decubitus, and is reportedly, s/p recent debridement. Wound shows evidence of granulation, and does not appear overly infected. Utilizing mattress overlay. Patient has been evaluated by [facility], has undergone further sharp debridement on 08/04/12, as well as hydrotherapy. Managing as recommended.  7. Recent pyelonephritis: Patient is reportedly, s/p recent hospitalization at [facility], for a pyelonephritis, for which shes was treated with Tobramycin, then Vancomycin. She is afebrile, and does not look toxic. Repeat urinalysis revealed pyuria, but culture showed no growth. .  8. FTT: It appears that patient has had FTT and poor oral intake, and that the question of possible PEG was discussed at SNF. Have discussed with daughter on 08/04/12, and after conferring with family, they have elected to proceed. IR has been consulted, and PEG is scheduled for 08/07/12.    Code Status: Full Code.  Family Communication: Discussed in detail with daughter at bedside.  Disposition Plan: To be determined. Aiming  possible  discharge on 08/08/12.    Brief narrative: This is a 79 year old female nursing facility resident, with known history of ESRD on HD T-T-S, chronic anemia, DM, dyslipidemia, secondary hyperparathyroidism, HTN, PVD, s/p bilateral BKA, GERD, sacral decubitus s/p recent debridement,s/p recent hospitalization at [facility], for pyelonephritis, treated with Tobramycin, then discharged on Vancomycin with HD. Apparently, patient is s/p HD on 08/02/12, which she completed without incident, then en route to the nursing facility, she had an episode of unresponsiveness, associated with facial drooping, lasting for about 20 mins, before patient arrived at the nursing facility. She was then brought to the ED. Per ED MD, patient on arrival, was asymptomatic and at baseline mental status. She was admitted for further evaluation and management.    Consultants:  Dr Wyatt Portela, neurologist.  Dr Delano Metz, nephrologist.   Procedures:  Head CT scan.   CXR.   Antibiotics:  N/A.   HPI/Subjective: No new issues.   Objective: Vital signs in last 24 hours: Temp:  [97.9 F (36.6 C)-99.7 F (37.6 C)] 98.3 F (36.8 C) (01/20 1003) Pulse Rate:  [86-121] 121  (01/20 1003) Resp:  [16-20] 18  (01/20 1003) BP: (112-164)/(53-95) 125/56 mmHg (01/20 1003) SpO2:  [94 %-98 %] 96 % (01/20 1003) Weight change:     Intake/Output from previous day: 01/19 0701 - 01/20 0700 In: -  Out: 375 [Urine:375]     Physical Exam: General: Comfortable. Not short of breath at rest.  HEENT: Moderate clinical pallor, no jaundice, no conjunctival injection or discharge.  NECK: Supple, JVP not seen, no carotid bruits, no palpable lymphadenopathy, no palpable goiter.  CHEST: Clinically clear to auscultation, no wheezes, no crackles.  HEART: Sounds 1 and 2 heard, normal, regular, no murmurs.  ABDOMEN: Full, soft, non-tender, no palpable organomegaly, no palpable masses, normal bowel sounds.  GENITALIA:  Not examined.  LOWER EXTREMITIES: Unremarkable bilateral BKA stumps.  MUSCULOSKELETAL SYSTEM: Bilateral BKA, otherwise, generalized osteoarthritic changes, otherwise, no.  CENTRAL NERVOUS SYSTEM: There is currently no facial asymmetry, but patient has weakness of left arm grip.  Lab Results:  [facility] 08/06/12 0652 08/05/12 0640  WBC 6.6 8.3  HGB 7.6* 7.8*  HCT 24.8* 26.0*  PLT 266 288    Basename 08/06/12 0652 08/05/12 0640  NA 143 138  K 3.5 3.5  CL 104 99  CO2 26 27  GLUCOSE 151* 102*  BUN 25* 19  CREATININE 4.58* 3.50*  CALCIUM 8.2* 8.4   Recent Results (from the past 240 hour(s))  URINE CULTURE     Status: Normal   Collection Time   08/02/12  6:43 PM      Component Value Range Status Comment   Specimen Description URINE, CATHETERIZED   Final    Special Requests NONE   Final    Culture  Setup Time 08/03/2012 14:26   Final    Colony Count NO GROWTH   Final    Culture NO GROWTH   Final    Report Status 08/04/2012 FINAL   Final   MRSA PCR SCREENING     Status: Abnormal   Collection Time   08/02/12  9:45 PM      Component Value Range Status Comment   MRSA by PCR POSITIVE (*) NEGATIVE Final      Studies/Results: No results found.  Medications: Scheduled Meds:    . aspirin  300 mg Rectal Daily   Or  . aspirin  325 mg Oral Daily  .  ceFAZolin (ANCEF) IV  1 g  Intravenous Once  . Chlorhexidine Gluconate Cloth  6 each Topical Q0600  . darbepoetin (ARANESP) injection - DIALYSIS  100 mcg Intravenous Q Sat-HD  . doxercalciferol  0.5 mcg Intravenous Q T,Th,Sa-HD  . heparin  5,000 Units Subcutaneous Q8H  . influenza  inactive virus vaccine  0.5 mL Intramuscular Tomorrow-1000  . mupirocin ointment  1 application Nasal BID  . sodium chloride  3 mL Intravenous Q12H   Continuous Infusions:    . dextrose 5 % and 0.9% NaCl 40 mL/hr at 08/04/12 0512   PRN Meds:.sodium chloride, acetaminophen, acetaminophen, LORazepam, ondansetron (ZOFRAN) IV, RESOURCE THICKENUP CLEAR,  sodium chloride    LOS: 4 days   Linda [name],Linda [name]  [facility] Pager [phone]. If 8PM-8AM, please contact night-coverage at [email or web address], password [facility] 08/06/2012, 12:39 PM  LOS: 4 days

## 2012-08-06 NOTE — Progress Notes (Signed)
Speech Language Pathology Dysphagia Treatment Patient Details Name: Linda [name] MRN: [id number] DOB: 04-17-1931 Today's Date: 08/06/2012 Time: 1250-1310 SLP Time Calculation (min): 20 min  Assessment / Plan / Recommendation Clinical Impression  SLP administered PO trials of pureed solids from pt's lunch tray, which were consumed without incidence. Oral phase was mildly prolonged, however pt did not exhibit overt s/sx of aspiration/penetration throughout pureed trials. Pt remains afebrile and lung sounds are clear per MD note. Upgraded trials of nectar thick liquids were administered via teaspoon, resulting in a wet vocal quality after the swallow. Pt became agitated and no further trials were attempted. Pt appears to be tolerating her current diet, with plan for PEG placement 1/21. Recommend to continue with dysphagia 1 (puree) diet consistency with pudding thick liquids (no true liquids) with full supervision with all meals due to cognitive deficits. Recommend to complete oral care with oral suction before and after PO intake.    Diet Recommendation  Continue with Current Diet: Dysphagia 1 (puree);Pudding-thick liquid    SLP Plan Continue with current plan of care   Pertinent Vitals/Pain N/A   Swallowing Goals  SLP Swallowing Goals Patient will consume recommended diet without observed clinical signs of aspiration with: Total assistance Swallow Study Goal #1 - Progress: Progressing toward goal Patient will utilize recommended strategies during swallow to increase swallowing safety with: Total assistance Swallow Study Goal #2 - Progress: Progressing toward goal  General Temperature Spikes Noted: No Respiratory Status: Room air Behavior/Cognition: Alert;Confused;Agitated;Requires cueing Oral Cavity - Dentition: Edentulous Patient Positioning: Upright in bed  Oral Cavity - Oral Hygiene Does patient have any of the following "at risk" factors?: Diet - patient on thickened  liquids Brush patient's teeth BID with toothbrush (using toothpaste with fluoride): No (Comment) (edentulous) Patient is HIGH RISK - Oral Care Protocol followed (see row info): Yes Patient is AT RISK - Oral Care Protocol followed (see row info): Yes   Dysphagia Treatment Treatment focused on: Skilled observation of diet tolerance;Upgraded PO texture trials Treatment Methods/Modalities: Skilled observation;Differential diagnosis Patient observed directly with PO's: Yes Type of PO's observed: Dysphagia 1 (puree);Nectar-thick liquids Feeding: Total assist Liquids provided via: Teaspoon Oral Phase Signs & Symptoms: Prolonged oral phase (mildly prolonged) Pharyngeal Phase Signs & Symptoms: Wet vocal quality (s/p trial of nectar)   GO     Maxcine Ham, MA CF-SLP  Mammie Lorenzo 08/06/2012, 1:57 PM

## 2012-08-07 ENCOUNTER — Inpatient Hospital Stay (HOSPITAL_COMMUNITY): Payer: Medicare Other

## 2012-08-07 ENCOUNTER — Encounter (HOSPITAL_COMMUNITY): Payer: Self-pay | Admitting: Radiology

## 2012-08-07 LAB — RENAL FUNCTION PANEL
Albumin: 2.1 g/dL — ABNORMAL LOW (ref 3.5–5.2)
Calcium: 8.3 mg/dL — ABNORMAL LOW (ref 8.4–10.5)
GFR calc Af Amer: 8 mL/min — ABNORMAL LOW (ref >90)
GFR calc non Af Amer: 7 mL/min — ABNORMAL LOW (ref >90)
Glucose, Bld: 149 mg/dL — ABNORMAL HIGH (ref 70–99)
Phosphorus: 4.6 mg/dL (ref 2.3–4.6)
Potassium: 3.6 mEq/L (ref 3.5–5.1)
Sodium: 142 mEq/L (ref 135–145)

## 2012-08-07 LAB — GLUCOSE, CAPILLARY
Glucose-Capillary: 123 mg/dL — ABNORMAL HIGH (ref 70–99)
Glucose-Capillary: 139 mg/dL — ABNORMAL HIGH (ref 70–99)

## 2012-08-07 LAB — CBC
HCT: 24.8 % — ABNORMAL LOW (ref 36.0–46.0)
MCHC: 31 g/dL (ref 30.0–36.0)
Platelets: 267 10*3/uL (ref 150–400)
RDW: 18.5 % — ABNORMAL HIGH (ref 11.5–15.5)
WBC: 8.9 10*3/uL (ref 4.0–10.5)

## 2012-08-07 LAB — IRON AND TIBC
Iron: 18 ug/dL — ABNORMAL LOW (ref 42–135)
Saturation Ratios: 18 % — ABNORMAL LOW (ref 20–55)
TIBC: 99 ug/dL — ABNORMAL LOW (ref 250–470)

## 2012-08-07 LAB — ABO/RH: ABO/RH(D): O POS

## 2012-08-07 MED ORDER — ALTEPLASE 100 MG IV SOLR
4.0000 mg | Freq: Once | INTRAVENOUS | Status: DC
  Filled 2012-08-07 (×3): qty 4

## 2012-08-07 NOTE — Procedures (Signed)
Pt seen on HD.  Ap 110  VP 110  SBP 150.  Pt talking some.  To get G Tube today.  Hg pending.  Will check iron studies

## 2012-08-07 NOTE — Progress Notes (Signed)
[facility] PROGRESS NOTE  Auburn Hester ZOX:[id number] DOB: Sep 26, 1930 DOA: 08/02/2012 PCP: Colon Branch, MD  Assessment/Plan: Active Problems:  Altered mental status  Syncope  ESRD (end stage renal disease)  Diabetes mellitus, type 2  HTN (hypertension)  Anemia  Sacral decubitus ulcer  FTT (failure to thrive) in adult  CVA (cerebral infarction)    1. Altered mental status/CVA: Patient presented with what was described as an episode of very poor responsiveness, associated with facial droop, following HD. Clinically, she does have some weakness of left upper extremity. Head CT scan revealed an infarct within the right parietal lobe which may be subacute to chronic. Patient reportedly, was last seen normal at 2:15 PM on 08/02/12, so she was outside the window for t-PA, and symptoms had improved on arrival, although her other co-morbidities, including a moderate anemia, may also have been contraindications. Dr Wyatt Portela provided neurology consultation and CVA w/u, carried out. Brain MRI showed subcentimeter left frontal subcortical white matter infarct, approximately 5 mm in diameter. No hemorrhage. Advanced atrophy and extensive chronic microvascular ischemic change. Remote ischemic events are stable. MRA revealed potentially flow reducing lesion supraclinoid internal carotid artery on the right, estimated 50-75% stenosis.Widespread intracranial atherosclerotic disease elsewhere. 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 Carotid doppler showed no evidence of hemodynamically significant internal carotid artery stenosis. Vertebral artery flow is antegrade. 2D Echocardiogram revealed normal LV cavity size, mild LVH and EF of 65% to 70%. Continued on anti-platelet medication. EKG shows SR, with old Q-waves in V1-V2, but no acute ischemic changes. SLP has evaluated and recommended D1/Pudding thick.  2. ESRD  (end stage renal disease): Patient is on HD T-T-S, and is s/p hemodialysis on 08/02/12. Dr Delano Metz provided nephrology consultation and regular dialysis schedule has been reinstated. Patient was found on 08/07/12, to have a clotted AV-fistula and has been scheduled for possible rightt upper arm graft thrombolysis and possible angioplasty/stent placement 3 Diabetes mellitus, type 2: Patient has insulin-requiring type 2 diabetes mellitus. CBG was 66 on presentation, but came up to 80, while in the ED. As patient is clearly at high risk of hypoglycemia, given ESRD, insulin therapy and NPO status, she has been placed on dextrose infusion, albeit at low rate of 40cc/hr, to avoid fluid overload. All insulin is on hold, and CBGs have remained satisfactory. 4. HTN (hypertension): BP is currently ranging between 154/76-165/62. This is reasonable in the context of new CVA. Will avoid aggressive BP control.  5. Anemia: This moderate, normocytic and due to chronic disease. Following CBC, but HB is gradually drifting down, and transfusion may prove necessary.  6. Sacral decubitus ulcer: Patient has a rather large, approximately 8 cm x 9 cm stage 4 sacral decubitus, and is reportedly, s/p recent debridement. Wound shows evidence of granulation, and does not appear overly infected. Utilizing mattress overlay. Patient has been evaluated by [facility], has undergone further sharp debridement on 08/04/12, as well as hydrotherapy. Managing as recommended.  7. Recent pyelonephritis: Patient is reportedly, s/p recent hospitalization at [facility], for a pyelonephritis, for which shes was treated with Tobramycin, then Vancomycin. She is afebrile, and does not look toxic. Repeat urinalysis revealed pyuria, but culture showed no growth. .  8. FTT: It appears that patient has had FTT and poor oral intake, and that the question of possible PEG was discussed at SNF. Have discussed with daughter on 08/04/12, and after conferring with  family, they have elected to proceed.  IR has been consulted, and PEG was scheduled for 08/07/12, but had to be postponed, till issues with AVG sorted out.    Code Status: Full Code.  Family Communication: Discussed in detail with daughter at bedside.  Disposition Plan: To be determined. Aiming discharge to SNF after AVG issues resolved and PEG placement completed.    Brief narrative: This is a 79 year old female nursing facility resident, with known history of ESRD on HD T-T-S, chronic anemia, DM, dyslipidemia, secondary hyperparathyroidism, HTN, PVD, s/p bilateral BKA, GERD, sacral decubitus s/p recent debridement,s/p recent hospitalization at [facility], for pyelonephritis, treated with Tobramycin, then discharged on Vancomycin with HD. Apparently, patient is s/p HD on 08/02/12, which she completed without incident, then en route to the nursing facility, she had an episode of unresponsiveness, associated with facial drooping, lasting for about 20 mins, before patient arrived at the nursing facility. She was then brought to the ED. Per ED MD, patient on arrival, was asymptomatic and at baseline mental status. She was admitted for further evaluation and management.    Consultants:  Dr Wyatt Portela, neurologist.  Dr Delano Metz, nephrologist.   Procedures:  Head CT scan.   CXR.   Antibiotics:  N/A.   HPI/Subjective: No new issues.   Objective: Vital signs in last 24 hours: Temp:  [97.4 F (36.3 C)-100.2 F (37.9 C)] 97.4 F (36.3 C) (01/21 1530) Pulse Rate:  [93-113] 101  (01/21 1530) Resp:  [16-22] 20  (01/21 1530) BP: (120-183)/(60-119) 155/98 mmHg (01/21 1530) SpO2:  [96 %-100 %] 100 % (01/21 1530) Weight:  [45.4 kg (100 lb 1.4 oz)] 45.4 kg (100 lb 1.4 oz) (01/21 0800) Weight change:     Intake/Output from previous day: 01/20 0701 - 01/21 0700 In: 0  Out: 400 [Urine:400] Total I/O In: -  Out: 1240 [Other:1240]   Physical Exam: General: Comfortable.  Not short of breath at rest.  HEENT: Moderate clinical pallor, no jaundice, no conjunctival injection or discharge.  NECK: Supple, JVP not seen, no carotid bruits, no palpable lymphadenopathy, no palpable goiter.  CHEST: Clinically clear to auscultation, no wheezes, no crackles.  HEART: Sounds 1 and 2 heard, normal, regular, no murmurs.  ABDOMEN: Full, soft, non-tender, no palpable organomegaly, no palpable masses, normal bowel sounds.  GENITALIA: Not examined.  LOWER EXTREMITIES: Unremarkable bilateral BKA stumps.  MUSCULOSKELETAL SYSTEM: Bilateral BKA, otherwise, generalized osteoarthritic changes, otherwise, no.  CENTRAL NERVOUS SYSTEM: There is currently no facial asymmetry, but patient has weakness of left arm grip.  Lab Results:  [facility] 08/07/12 0824 08/06/12 0652  WBC 8.9 6.6  HGB 7.7* 7.6*  HCT 24.8* 24.8*  PLT 267 266    Basename 08/07/12 0712 08/06/12 0652  NA 142 143  K 3.6 3.5  CL 101 104  CO2 27 26  GLUCOSE 149* 151*  BUN 30* 25*  CREATININE 5.34* 4.58*  CALCIUM 8.3* 8.2*   Recent Results (from the past 240 hour(s))  URINE CULTURE     Status: Normal   Collection Time   08/02/12  6:43 PM      Component Value Range Status Comment   Specimen Description URINE, CATHETERIZED   Final    Special Requests NONE   Final    Culture  Setup Time 08/03/2012 14:26   Final    Colony Count NO GROWTH   Final    Culture NO GROWTH   Final    Report Status 08/04/2012 FINAL   Final   MRSA PCR SCREENING  Status: Abnormal   Collection Time   08/02/12  9:45 PM      Component Value Range Status Comment   MRSA by PCR POSITIVE (*) NEGATIVE Final      Studies/Results: Dg Abd Portable 1v  08/07/2012  *RADIOLOGY REPORT*  Clinical Data: Percutaneous gastrostomy tube placement  PORTABLE ABDOMEN - 1 VIEW  Comparison: Earlier same day; 08/06/2012  Findings:  Minimal transit of previously administered enteric contrast, now seen within the more distal small bowel.  Paucity of bowel gas  without definite evidence of obstruction.  No supine evidence of pneumoperitoneum.  Enteric tube tip and sideport project over the expected location of the gastric fundus.  Limited visualization of the lower thorax demonstrates mild elevation of the right hemidiaphragm.  Mild multilevel lumbar spine degenerative change.  Osteopenia without definite fracture.  Vascular calcifications.  IMPRESSION: 1.  Minimal transit of previously ingested enteric contrast, now seen within the more distal small bowel, but has not yet reached the transverse colon.  2.  Nonobstructive bowel gas pattern.   Original Report Authenticated By: Tacey Ruiz, MD    Dg Abd Portable 1v  08/07/2012  *RADIOLOGY REPORT*  Clinical Data: Evaluate barium prior to potential gastrostomy tube placement  PORTABLE ABDOMEN - 1 VIEW  Comparison: 08/06/2012; CT abdomen pelvis - 12/09/2006  Findings:  Administered enteric contrast seen to the level of the mid/distal small bowel.  Evaluation of the bowel gas pattern is degraded secondary to patient motion artifact.  No definite evidence of obstruction. Evaluation for pneumoperitoneum is degraded secondary to patient motion artifact and exclusion of the lower thorax.  Osteopenia with suspected multilevel degenerative change.  Vascular calcifications.  IMPRESSION: Enteric contrast seen to the level of the mid/distal small bowel. No definite evidence of obstruction.   Original Report Authenticated By: Tacey Ruiz, MD    Dg Abd Portable 1v  08/06/2012  *RADIOLOGY REPORT*  Clinical Data: 79 year old female NG tube placement.  PORTABLE ABDOMEN - 1 VIEW  Comparison: CT abdomen 12/09/2006.  Findings: Portable supine AP view 1720 hours.  NG tube in place, tip projects in the right upper quadrant, likely in the proximal duodenum.  Mild motion artifact. Nonobstructed bowel gas pattern. Grossly clear lung bases. Aorto iliac calcified atherosclerosis.  IMPRESSION: NG tube in place, tip at the level of the proximal  duodenum.   Original Report Authenticated By: Erskine Speed, M.D.     Medications: Scheduled Meds:    . alteplase  4 mg Intracatheter Once  . aspirin  300 mg Rectal Daily   Or  . aspirin  325 mg Oral Daily  .  ceFAZolin (ANCEF) IV  1 g Intravenous Once  . Chlorhexidine Gluconate Cloth  6 each Topical Q0600  . darbepoetin (ARANESP) injection - DIALYSIS  100 mcg Intravenous Q Sat-HD  . doxercalciferol  0.5 mcg Intravenous Q T,Th,Sa-HD  . heparin  5,000 Units Subcutaneous Q8H  . mupirocin ointment  1 application Nasal BID  . sodium chloride  3 mL Intravenous Q12H   Continuous Infusions:    . dextrose 5 % and 0.9% NaCl 40 mL/hr at 08/04/12 0512   PRN Meds:.sodium chloride, acetaminophen, acetaminophen, LORazepam, ondansetron (ZOFRAN) IV, RESOURCE THICKENUP CLEAR, sodium chloride    LOS: 5 days   Mickael Mcnutt,CHRISTOPHER  [facility] Pager [phone]. If 8PM-8AM, please contact night-coverage at [email or web address], password [facility] 08/07/2012, 6:04 PM  LOS: 5 days

## 2012-08-07 NOTE — Progress Notes (Signed)
Patient arterial needle stopped functioning during treatment, attempted to adjust, unable to, pulled needle AVG still had bruit and thrill present, attempted to re cannulated with no success, AVG Negative for bruit and thrill, MD notified, pt returned to room, report to primary RN.

## 2012-08-07 NOTE — Progress Notes (Signed)
PT HYDROTHERAPY PROGRESS NOTE    08/07/12 1500  Subjective Assessment  Subjective pt moaning   Date of Onset (prior to admission)  Wound 08/02/12 Other (Comment) Back Mid;Lower  Date First Assessed/Time First Assessed: 08/02/12 1820   Wound Type: (c) Other (Comment)  Location: Back  Location Orientation: Mid;Lower  Present on Admission: Yes  Site / Wound Assessment Pink;Yellow;Black;Brown;Dusky;Granulation tissue;Red  % Wound base Red or Granulating 70%  % Wound base Yellow 20%  % Wound base Black 5%  % Wound base Other (Comment) 5% (bone chips)  Peri-wound Assessment Pink;Maceration;Erythema (blanchable)  Margins Unattacted edges (unapproximated)  Closure None  Drainage Amount Moderate (increased drainage today)  Drainage Description Serosanguineous;Odor  Non-staged Wound Description Full thickness  Treatment Hydrotherapy (Pulse lavage);Packing (Saline gauze);Tape changed  Dressing Type ABD;Other (Comment);Barrier Film (skin prep);Gauze (Comment)  Dressing Changed Changed  Dressing Status Clean;Dry;Intact  Hydrotherapy  Pulsed Lavage with Suction (psi) 4 psi  Pulsed Lavage with Suction - Normal Saline Used 1000 mL  Pulsed Lavage Tip Tip with splash shield  Pulsed lavage therapy - wound location Sacral wound  Selective Debridement  Selective Debridement - Location sacral wound  Selective Debridement - Tools Used Forceps;Scalpel;Scissors  Selective Debridement - Tissue Removed smal area of black eschar removed as well as multiple sites of yellow eschar  Wound Therapy - Assess/Plan/Recommendations  Wound Therapy - Clinical Statement Wound continues to make progress in healing as evidenced by increased viable tissue and decreased necrotic tissue.  There is increased drainage. Will continue to perform hydrotherapy as indicated to facilitate healing process.  Wound Therapy - Functional Problem List decreased skin integrity, decreased functional mobility  Factors Delaying/Impairing  Wound Healing Altered sensation;Diabetes Mellitus;Multiple medical problems;Vascular compromise;Infection - systemic/local  Hydrotherapy Plan Debridement;Patient/family education;Pulsatile lavage with suction  Wound Therapy - Frequency 6X / week  Wound Plan Will see 6x a week as indicated with PLS, selective debridement and dressing changes  Wound Therapy Goals - Improve the function of patient's integumentary system by progressing the wound(s) through the phases of wound healing by:  Decrease Necrotic Tissue to <20%  Decrease Necrotic Tissue - Progress Progressing toward goal  Increase Granulation Tissue to >75%  Increase Granulation Tissue - Progress Progressing toward goal  Improve Drainage Characteristics Min  Improve Drainage Characteristics - Progress Not progressing  Time For Goal Achievement 7 days  Wound Therapy - Potential for Goals Fair   Charlotte Crumb, PT DPT  [phone]

## 2012-08-07 NOTE — Plan of Care (Signed)
Problem: Phase II Progression Outcomes Goal: Able to communicate Outcome: Not Progressing Patient only able to utter few words; unable to sustain conversation.

## 2012-08-07 NOTE — H&P (Signed)
Linda [name] is an 79 y.o. female.   Chief Complaint: ESRD Pt was in dialysis today when Rt upper arm graft clotted Request for declot per Dr Briant Cedar Last intervention: 11/16/11: successful Scheduled for possible Rt upper arm graft thrombolysis and poss angioplasty/stent placement Possible dialysis catheter placement if needed  Pt also scheduled for G tube placement tentatively today KUB this am showed barium in Sm Bowel; rechecking KUB this afternoon. We will proceed with only one procedure today; the other tomorrow Dependent on KUB result and ability to gain consent from dtr over phone for declot.  HPI: Syncope; ESRD; HLD; HTN; B BKA; DM; PVD  Past Medical History  Diagnosis Date  . Syncope   . End stage kidney disease   . Hyperlipidemia   . Anemia   . Hypertension   . 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 Diabetes mellitus   . GERD (gastroesophageal reflux disease)   . Secondary hyperparathyroidism   . PVD (peripheral vascular disease)     History reviewed. No pertinent past surgical history.  No family history on file. Social History:  reports that she has never smoked. She has never used smokeless tobacco. She reports that she does not drink alcohol or use illicit drugs.  Allergies:  Allergies  Allergen Reactions  . Percocet (Oxycodone-Acetaminophen)     Medications Prior to Admission  Medication Sig Dispense Refill  . amLODipine (NORVASC) 10 MG tablet Take 10 mg by mouth daily.      Marland Kitchen aspirin 81 MG tablet Take 81 mg by mouth daily.      Marland Kitchen b complex-vitamin c-folic acid (NEPHRO-VITE) 0.8 MG TABS Take 0.8 mg by mouth daily.       . bisacodyl (DULCOLAX) 10 MG suppository Place 10 mg rectally daily.      . cloNIDine (CATAPRES - DOSED IN MG/24 HR) 0.3 mg/24hr Place 1 patch onto the skin once a week. On Mondays      . diltiazem (CARDIZEM CD) 240 MG 24 hr capsule Take 240 mg by mouth daily.      Marland Kitchen HYDROcodone-acetaminophen  (NORCO/VICODIN) 5-325 MG per tablet Take 1 tablet by mouth 3 (three) times daily as needed. For pain      . insulin detemir (LEVEMIR) 100 UNIT/ML injection Inject 10 Units into the skin at bedtime.      . insulin lispro (HUMALOG) 100 UNIT/ML injection Inject 5 Units into the skin See admin instructions. on Tues, Thurs and Sat.  (dialysis days) patient gets 5 units at 1430.  On all other days patient gets 5 units at 1130 before lunch.      . iron polysaccharides (NIFEREX) 150 MG capsule Take 150 mg by mouth daily.      . mirtazapine (REMERON) 15 MG tablet Take 15 mg by mouth at bedtime.      . ondansetron (ZOFRAN) 4 MG tablet Take 4 mg by mouth every 12 (twelve) hours as needed. For nausea/vomiting      . ondansetron (ZOFRAN) 4 MG tablet Take 4 mg by mouth 3 (three) times a week. Tues Thurs Sat (Dialysis days)      . PARoxetine (PAXIL) 20 MG tablet Take 20 mg by mouth every morning.      . polyethylene glycol (MIRALAX / GLYCOLAX) packet Take 17 g by mouth 2 (two) times daily.      Marland Kitchen PRESCRIPTION MEDICATION Apply 1 mL topically daily as needed. Ativan 1mg /ml topical gel: For agitation after dialysis      .  ranitidine (ZANTAC) 150 MG tablet Take 150 mg by mouth at bedtime.      . risperiDONE (RISPERDAL) 0.5 MG tablet Take 0.5 mg by mouth 2 (two) times daily.      . simvastatin (ZOCOR) 10 MG tablet Take 10 mg by mouth daily.       . vitamin C (ASCORBIC ACID) 500 MG tablet Take 500 mg by mouth 3 (three) times daily.      . WHEY PROTEIN PO Take 35 mLs by mouth 2 (two) times daily. Mixed in vanilla pudding      . zinc sulfate (ZINC-220) 220 MG capsule Take 220 mg by mouth daily.        Results for orders placed during the hospital encounter of 08/02/12 (from the past 48 hour(s))  GLUCOSE, CAPILLARY     Status: Normal   Collection Time   08/05/12 12:04 PM      Component Value Range Comment   Glucose-Capillary 98  70 - 99 mg/dL   GLUCOSE, CAPILLARY     Status: Abnormal   Collection Time   08/05/12   4:14 PM      Component Value Range Comment   Glucose-Capillary 113 (*) 70 - 99 mg/dL   GLUCOSE, CAPILLARY     Status: Abnormal   Collection Time   08/05/12  8:07 PM      Component Value Range Comment   Glucose-Capillary 136 (*) 70 - 99 mg/dL    Comment 1 Notify RN     GLUCOSE, CAPILLARY     Status: Abnormal   Collection Time   08/06/12 12:02 AM      Component Value Range Comment   Glucose-Capillary 179 (*) 70 - 99 mg/dL    Comment 1 Notify RN     GLUCOSE, CAPILLARY     Status: Abnormal   Collection Time   08/06/12  4:48 AM      Component Value Range Comment   Glucose-Capillary 148 (*) 70 - 99 mg/dL    Comment 1 Notify RN     CBC     Status: Abnormal   Collection Time   08/06/12  6:52 AM      Component Value Range Comment   WBC 6.6  4.0 - 10.5 K/uL    RBC 2.86 (*) 3.87 - 5.11 MIL/uL    Hemoglobin 7.6 (*) 12.0 - 15.0 g/dL    HCT 86.5 (*) 78.4 - 46.0 %    MCV 86.7  78.0 - 100.0 fL    MCH 26.6  26.0 - 34.0 pg    MCHC 30.6  30.0 - 36.0 g/dL    RDW 69.6 (*) 29.5 - 15.5 %    Platelets 266  150 - 400 K/uL   RENAL FUNCTION PANEL     Status: Abnormal   Collection Time   08/06/12  6:52 AM      Component Value Range Comment   Sodium 143  135 - 145 mEq/L    Potassium 3.5  3.5 - 5.1 mEq/L    Chloride 104  96 - 112 mEq/L    CO2 26  19 - 32 mEq/L    Glucose, Bld 151 (*) 70 - 99 mg/dL    BUN 25 (*) 6 - 23 mg/dL    Creatinine, Ser 2.84 (*) 0.50 - 1.10 mg/dL    Calcium 8.2 (*) 8.4 - 10.5 mg/dL    Phosphorus 4.5  2.3 - 4.6 mg/dL    Albumin 2.0 (*) 3.5 - 5.2 g/dL  GFR calc non Af Amer 8 (*) >90 mL/min    GFR calc Af Amer 10 (*) >90 mL/min   GLUCOSE, CAPILLARY     Status: Abnormal   Collection Time   08/06/12  8:11 AM      Component Value Range Comment   Glucose-Capillary 144 (*) 70 - 99 mg/dL   APTT     Status: Abnormal   Collection Time   08/06/12 10:26 AM      Component Value Range Comment   aPTT 51 (*) 24 - 37 seconds   PROTIME-INR     Status: Normal   Collection Time   08/06/12  10:26 AM      Component Value Range Comment   Prothrombin Time 13.2  11.6 - 15.2 seconds    INR 1.01  0.00 - 1.49   GLUCOSE, CAPILLARY     Status: Abnormal   Collection Time   08/06/12 11:42 AM      Component Value Range Comment   Glucose-Capillary 238 (*) 70 - 99 mg/dL   GLUCOSE, CAPILLARY     Status: Abnormal   Collection Time   08/06/12  4:15 PM      Component Value Range Comment   Glucose-Capillary 234 (*) 70 - 99 mg/dL   GLUCOSE, CAPILLARY     Status: Abnormal   Collection Time   08/06/12  8:21 PM      Component Value Range Comment   Glucose-Capillary 163 (*) 70 - 99 mg/dL    Comment 1 Documented in Chart      Comment 2 Notify RN     GLUCOSE, CAPILLARY     Status: Abnormal   Collection Time   08/07/12 12:26 AM      Component Value Range Comment   Glucose-Capillary 126 (*) 70 - 99 mg/dL    Comment 1 Documented in Chart      Comment 2 Notify RN     GLUCOSE, CAPILLARY     Status: Abnormal   Collection Time   08/07/12  4:12 AM      Component Value Range Comment   Glucose-Capillary 123 (*) 70 - 99 mg/dL    Comment 1 Documented in Chart      Comment 2 Notify RN     RENAL FUNCTION PANEL     Status: Abnormal   Collection Time   08/07/12  7:12 AM      Component Value Range Comment   Sodium 142  135 - 145 mEq/L    Potassium 3.6  3.5 - 5.1 mEq/L    Chloride 101  96 - 112 mEq/L    CO2 27  19 - 32 mEq/L    Glucose, Bld 149 (*) 70 - 99 mg/dL    BUN 30 (*) 6 - 23 mg/dL    Creatinine, Ser 1.61 (*) 0.50 - 1.10 mg/dL    Calcium 8.3 (*) 8.4 - 10.5 mg/dL    Phosphorus 4.6  2.3 - 4.6 mg/dL    Albumin 2.1 (*) 3.5 - 5.2 g/dL    GFR calc non Af Amer 7 (*) >90 mL/min    GFR calc Af Amer 8 (*) >90 mL/min   CBC     Status: Abnormal   Collection Time   08/07/12  8:24 AM      Component Value Range Comment   WBC 8.9  4.0 - 10.5 K/uL    RBC 2.88 (*) 3.87 - 5.11 MIL/uL    Hemoglobin 7.7 (*) 12.0 - 15.0 g/dL  HCT 24.8 (*) 36.0 - 46.0 %    MCV 86.1  78.0 - 100.0 fL    MCH 26.7  26.0 - 34.0  pg    MCHC 31.0  30.0 - 36.0 g/dL    RDW 78.2 (*) 95.6 - 15.5 %    Platelets 267  150 - 400 K/uL    Dg Abd Portable 1v  08/07/2012  *RADIOLOGY REPORT*  Clinical Data: Evaluate barium prior to potential gastrostomy tube placement  PORTABLE ABDOMEN - 1 VIEW  Comparison: 08/06/2012; CT abdomen pelvis - 12/09/2006  Findings:  Administered enteric contrast seen to the level of the mid/distal small bowel.  Evaluation of the bowel gas pattern is degraded secondary to patient motion artifact.  No definite evidence of obstruction. Evaluation for pneumoperitoneum is degraded secondary to patient motion artifact and exclusion of the lower thorax.  Osteopenia with suspected multilevel degenerative change.  Vascular calcifications.  IMPRESSION: Enteric contrast seen to the level of the mid/distal small bowel. No definite evidence of obstruction.   Original Report Authenticated By: Tacey Ruiz, MD    Dg Abd Portable 1v  08/06/2012  *RADIOLOGY REPORT*  Clinical Data: 79 year old female NG tube placement.  PORTABLE ABDOMEN - 1 VIEW  Comparison: CT abdomen 12/09/2006.  Findings: Portable supine AP view 1720 hours.  NG tube in place, tip projects in the right upper quadrant, likely in the proximal duodenum.  Mild motion artifact. Nonobstructed bowel gas pattern. Grossly clear lung bases. Aorto iliac calcified atherosclerosis.  IMPRESSION: NG tube in place, tip at the level of the proximal duodenum.   Original Report Authenticated By: Erskine Speed, M.D.     Review of Systems  Constitutional: Positive for weight loss. Negative for fever.  Neurological: Positive for weakness.    Blood pressure 182/89, pulse 111, temperature 97.9 F (36.6 C), temperature source Axillary, resp. rate 20, weight 100 lb 1.4 oz (45.4 kg), SpO2 96.00%. Physical Exam  Cardiovascular: Normal rate and regular rhythm.   No murmur heard. Respiratory: Effort normal. She has wheezes.  GI: Soft. Bowel sounds are normal. There is no tenderness.    Musculoskeletal:       B BKA Rt arm graft clotted   Neurological:       Tries to communicate some today Non verbal  Skin: Skin is warm.     Assessment/Plan Rt upper arm dialysis graft clotted Scheduled now for poss thrombolysis and poss pta/stent placement Poss dial cath if needed Awaiting consent from DTR; unable to get her on phone Order in for RN to continue to try to reah her  Ladainian Therien A 08/07/2012, 10:56 AM

## 2012-08-08 ENCOUNTER — Inpatient Hospital Stay (HOSPITAL_COMMUNITY): Payer: Medicare Other

## 2012-08-08 LAB — RENAL FUNCTION PANEL
CO2: 28 mEq/L (ref 19–32)
Calcium: 8.4 mg/dL (ref 8.4–10.5)
Chloride: 105 mEq/L (ref 96–112)
GFR calc Af Amer: 9 mL/min — ABNORMAL LOW (ref >90)
Glucose, Bld: 203 mg/dL — ABNORMAL HIGH (ref 70–99)
Potassium: 3.9 mEq/L (ref 3.5–5.1)
Sodium: 144 mEq/L (ref 135–145)

## 2012-08-08 LAB — GLUCOSE, CAPILLARY
Glucose-Capillary: 284 mg/dL — ABNORMAL HIGH (ref 70–99)
Glucose-Capillary: 160 mg/dL — ABNORMAL HIGH (ref 70–99)
Glucose-Capillary: 192 mg/dL — ABNORMAL HIGH (ref 70–99)

## 2012-08-08 LAB — CBC
Hemoglobin: 7.1 g/dL — ABNORMAL LOW (ref 12.0–15.0)
MCH: 26.3 pg (ref 26.0–34.0)
RBC: 2.7 MIL/uL — ABNORMAL LOW (ref 3.87–5.11)
WBC: 7.2 10*3/uL (ref 4.0–10.5)

## 2012-08-08 MED ORDER — IOHEXOL 300 MG/ML  SOLN
100.0000 mL | Freq: Once | INTRAMUSCULAR | Status: AC | PRN
  Administered 2012-08-08: 50 mL via INTRAVENOUS

## 2012-08-08 MED ORDER — HEPARIN SODIUM (PORCINE) 5000 UNIT/ML IJ SOLN
5000.0000 [IU] | Freq: Three times a day (TID) | INTRAMUSCULAR | Status: DC
  Administered 2012-08-08 – 2012-08-17 (×25): 5000 [IU] via SUBCUTANEOUS
  Filled 2012-08-08 (×30): qty 1

## 2012-08-08 MED ORDER — ALTEPLASE 2 MG IJ SOLR
2.0000 mg | Freq: Once | INTRAMUSCULAR | Status: AC
  Administered 2012-08-08: 2 mg
  Filled 2012-08-08: qty 2

## 2012-08-08 MED ORDER — FENTANYL CITRATE 0.05 MG/ML IJ SOLN
INTRAMUSCULAR | Status: AC | PRN
  Administered 2012-08-08 (×2): 25 ug via INTRAVENOUS

## 2012-08-08 MED ORDER — HEPARIN SODIUM (PORCINE) 1000 UNIT/ML IJ SOLN
INTRAMUSCULAR | Status: AC
  Filled 2012-08-08: qty 1

## 2012-08-08 MED ORDER — CEFAZOLIN SODIUM 1-5 GM-% IV SOLN
1.0000 g | Freq: Once | INTRAVENOUS | Status: AC
  Administered 2012-08-09: 1 g via INTRAVENOUS
  Filled 2012-08-08: qty 50

## 2012-08-08 MED ORDER — MIDAZOLAM HCL 2 MG/2ML IJ SOLN
INTRAMUSCULAR | Status: AC | PRN
  Administered 2012-08-08: 0.5 mg via INTRAVENOUS
  Administered 2012-08-08: 1 mg via INTRAVENOUS

## 2012-08-08 MED ORDER — FENTANYL CITRATE 0.05 MG/ML IJ SOLN
INTRAMUSCULAR | Status: AC
  Filled 2012-08-08: qty 4

## 2012-08-08 MED ORDER — HEPARIN SODIUM (PORCINE) 1000 UNIT/ML IJ SOLN
INTRAMUSCULAR | Status: AC | PRN
  Administered 2012-08-08: 3000 [IU] via INTRAVENOUS

## 2012-08-08 MED ORDER — MIDAZOLAM HCL 2 MG/2ML IJ SOLN
INTRAMUSCULAR | Status: AC
  Filled 2012-08-08: qty 4

## 2012-08-08 MED ORDER — DOXERCALCIFEROL 4 MCG/2ML IV SOLN
INTRAVENOUS | Status: AC
  Administered 2012-08-08: 0.5 ug via INTRAVENOUS
  Filled 2012-08-08: qty 2

## 2012-08-08 NOTE — Progress Notes (Signed)
Inpatient Diabetes Program Recommendations  AACE/ADA: New Consensus Statement on Inpatient Glycemic Control (2013)  Target Ranges:  Prepandial:   less than 140 mg/dL      Peak postprandial:   less than 180 mg/dL (1-2 hours)      Critically ill patients:  140 - 180 mg/dL   Reason for Visit: Results for HAILYNN, SLOVACEK (MRN [id number]) as of 08/08/2012 11:57  Ref. Range 08/07/2012 21:28 08/07/2012 23:57 08/08/2012 04:09 08/08/2012 08:13  Glucose-Capillary Latest Range: 70-99 mg/dL 409 (H) 811 (H) 914 (H) 160 (H)   Note history of diabetes.  Please add sensitive Novolog correction tid with meals and Levemir 5 units daily.   Will follow.

## 2012-08-08 NOTE — Progress Notes (Signed)
SLP Cancellation Note  Unable to complete SLP treatment - pt was in HD.   For PEG 1/23.  Will follow.  Nivan Melendrez L. Samson Frederic, [place] CCC/SLP Pager [phone]

## 2012-08-08 NOTE — Progress Notes (Signed)
Patient ID: Linda [name], female   DOB: [birth year], 79 y.o.   MRN: [id number]    Pt was originally scheduled for G tube placement in IR 1/21 Barium was not in colon; only in sm bowel - G tube was delayed.  Pt then clotted dialysis access and was scheduled instead for declot same day Was prepared but when IR staff came to get her pt refused to go.  Declot has now been completed of Rt upper arm graft on 1/22 Gastric tube placement has been rescheduled for 1/23 See orders

## 2012-08-08 NOTE — Progress Notes (Signed)
Pt received 2 units of blood. No s/s of rxn noted. Transfusion completed without complications.

## 2012-08-08 NOTE — Progress Notes (Signed)
[facility] PROGRESS NOTE  Linda [name] ZOX:[id number] DOB: [birth year] DOA: 08/02/2012 PCP: Colon Branch, MD Brief Narrative: This is a 79 year old female nursing facility resident, with known history of ESRD on HD T-T-S, chronic anemia, DM, dyslipidemia, secondary hyperparathyroidism, HTN, PVD, s/p bilateral BKA, GERD, sacral decubitus s/p recent debridement,s/p recent hospitalization at [facility], for pyelonephritis, treated with Tobramycin, then discharged on Vancomycin with HD. Apparently, patient is s/p HD on 08/02/12, which she completed without incident, then en route to the nursing facility, she had an episode of unresponsiveness, associated with facial drooping, lasting for about 20 mins, before patient arrived at the nursing facility. She was then brought to the ED. Per ED MD, patient on arrival, was asymptomatic and at baseline mental status. She was admitted for further evaluation and management.   Assessment/Plan: Altered mental status/CVA Patient presented with what was described as an episode of very poor responsiveness, associated with facial droop, following HD. Clinically, she does have some weakness of left upper extremity. Head CT scan revealed an infarct within the right parietal lobe which may be subacute to chronic. Patient reportedly, was last seen normal at 2:15 PM on 08/02/12, so she was outside the window for t-PA, and symptoms had improved on arrival, although her other co-morbidities, including a moderate anemia, may also have been contraindications. Dr Wyatt Portela provided neurology consultation and CVA w/u, carried out. Brain MRI showed subcentimeter left frontal subcortical white matter infarct, approximately 5 mm in diameter. No hemorrhage. Advanced atrophy and extensive chronic microvascular ischemic change. Remote ischemic events are stable. MRA revealed potentially flow reducing lesion supraclinoid internal carotid artery on the right, estimated  50-75% stenosis.Widespread intracranial atherosclerotic disease elsewhere. 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 Carotid doppler showed no evidence of hemodynamically significant internal carotid artery stenosis. Vertebral artery flow is antegrade. 2D Echocardiogram revealed normal LV cavity size, mild LVH and EF of 65% to 70%. Continued on anti-platelet medication. EKG shows SR, with old Q-waves in V1-V2, but no acute ischemic changes. SLP has evaluated and recommended Dysphagia 1 with pudding thick consistency.  ESRD (end stage renal disease) Patient is on HD T-T-S, and is s/p hemodialysis on 08/02/12. Dr Delano Metz provided nephrology consultation and regular dialysis schedule has been reinstated. Patient was found on 08/07/12, to have a clotted AV-fistula and has been scheduled for possible rightt upper arm graft thrombolysis and possible angioplasty/stent placement.  Diabetes mellitus, type 2 Patient has insulin-requiring type 2 diabetes mellitus. CBG was 66 on presentation, but came up to 80, while in the ED. As patient is clearly at high risk of hypoglycemia, given ESRD, insulin therapy and NPO status, she has been placed on dextrose infusion, albeit at low rate of 40cc/hr, to avoid fluid overload. All insulin is on hold, and CBGs have remained satisfactory.   HTN (hypertension) BP improved. Continue to monitor. Will avoid aggressive BP control.   Anemia Normocytic and due to chronic disease and ESRD. Following CBC, but HB is gradually drifting down, and transfusion may prove necessary.   Sacral decubitus ulcer Patient has a rather large, approximately 8 cm x 9 cm stage 4 sacral decubitus, and is reportedly, s/p recent debridement. Wound shows evidence of granulation, and does not appear overly infected. Utilizing mattress overlay. Patient has been evaluated by [facility], has undergone further sharp debridement on  08/04/12, as well as hydrotherapy. Managing as recommended.   Recent pyelonephritis Patient is reportedly, s/p recent hospitalization  at [facility], for a pyelonephritis, for which she was treated with Tobramycin, then Vancomycin. She is afebrile, and does not look toxic. Repeat urinalysis revealed pyuria, but culture showed no growth.  Failure to thrive  It appears that patient has had FTT and poor oral intake, and that the question of possible PEG was discussed at SNF. Have discussed with daughter on 08/04/12, and after conferring with family, they have elected to proceed. IR has been consulted, and PEG was scheduled for 08/07/12, but had to be postponed, till issues with AVG sorted out.   Code Status: Full Code.  Family Communication: No family at bedside.  Disposition Plan: To be determined. Aiming discharge to SNF after AVG issues resolved and PEG placement completed.    Consultants:  Dr. Leroy Kennedy, neurology  Dr. Schertz/Dr. Briant Cedar, renal  Procedures:  Head CT  CXR  Antibiotics:  None.  HPI/Subjective: Pleasantly confused. NG in place.  Objective: Filed Vitals:   08/07/12 1830 08/07/12 2131 08/08/12 0258 08/08/12 0608  BP: 139/71 176/51 127/92 133/75  Pulse: 94 96 103 104  Temp: 97.4 F (36.3 C) 98.6 F (37 C) 99.2 F (37.3 C)   TempSrc: Oral Axillary Axillary Oral  Resp: 20 18 20 16   Height:      Weight:      SpO2: 99% 100% 99% 100%    Intake/Output Summary (Last 24 hours) at 08/08/12 0852 Last data filed at 08/07/12 1948  Gross per 24 hour  Intake    120 ml  Output   1240 ml  Net  -1120 ml   Filed Weights   08/04/12 1638 08/07/12 0645 08/07/12 0800  Weight: 52.9 kg (116 lb 10 oz) 45.4 kg (100 lb 1.4 oz) 45.4 kg (100 lb 1.4 oz)    Exam: Physical Exam: General: Awake, not oriented, No acute distress. HEENT: EOMI, NG in place. Neck: Supple CV: S1 and S2 Lungs: Clear to ascultation bilaterally Abdomen: Soft, Nontender, Nondistended, +bowel  sounds. Ext: Good pulses. Trace edema.  Data Reviewed: Basic Metabolic Panel:  Lab 08/08/12 1610 08/07/12 0712 08/06/12 0652 08/05/12 0640 08/04/12 0550  NA 144 142 143 138 140  K 3.9 3.6 3.5 3.5 4.4  CL 105 101 104 99 100  CO2 28 27 26 27 24   GLUCOSE 203* 149* 151* 102* 79  BUN 27* 30* 25* 19 41*  CREATININE 4.67* 5.34* 4.58* 3.50* 5.63*  CALCIUM 8.4 8.3* 8.2* 8.4 8.5  MG -- -- -- -- --  PHOS 4.4 4.6 4.5 3.7 4.9*   Liver Function Tests:  Lab 08/08/12 9604 08/07/12 0712 08/06/12 0652 08/05/12 0640 08/04/12 0550 08/02/12 1651  AST -- -- -- -- -- 14  ALT -- -- -- -- -- <5  ALKPHOS -- -- -- -- -- 106  BILITOT -- -- -- -- -- 0.1*  PROT -- -- -- -- -- 7.2  ALBUMIN 2.1* 2.1* 2.0* 2.1* 1.9* --   No results found for this basename: LIPASE:5,AMYLASE:5 in the last 168 hours No results found for this basename: AMMONIA:5 in the last 168 hours CBC:  Lab 08/08/12 0633 08/07/12 0824 08/06/12 0652 08/05/12 0640 08/04/12 0550 08/02/12 1651  WBC 7.2 8.9 6.6 8.3 8.4 --  NEUTROABS -- -- -- -- -- 13.1*  HGB 7.1* 7.7* 7.6* 7.8* 7.2* --  HCT 23.7* 24.8* 24.8* 26.0* 23.8* --  MCV 87.8 86.1 86.7 88.4 87.2 --  PLT 241 267 266 288 269 --   Cardiac Enzymes:  Lab 08/02/12 1652  CKTOTAL --  CKMB --  CKMBINDEX --  TROPONINI <0.30   BNP (last 3 results)  Basename 09/14/11 1223  PROBNP 8852.0*   CBG:  Lab 08/08/12 0813 08/08/12 0409 08/07/12 2357 08/07/12 2128 08/07/12 1652  GLUCAP 160* 206* 259* 284* 139*    Recent Results (from the past 240 hour(s))  URINE CULTURE     Status: Normal   Collection Time   08/02/12  6:43 PM      Component Value Range Status Comment   Specimen Description URINE, CATHETERIZED   Final    Special Requests NONE   Final    Culture  Setup Time 08/03/2012 14:26   Final    Colony Count NO GROWTH   Final    Culture NO GROWTH   Final    Report Status 08/04/2012 FINAL   Final   MRSA PCR SCREENING     Status: Abnormal   Collection Time   08/02/12  9:45 PM       Component Value Range Status Comment   MRSA by PCR POSITIVE (*) NEGATIVE Final      Studies: Dg Abd Portable 1v  08/07/2012  *RADIOLOGY REPORT*  Clinical Data: Percutaneous gastrostomy tube placement  PORTABLE ABDOMEN - 1 VIEW  Comparison: Earlier same day; 08/06/2012  Findings:  Minimal transit of previously administered enteric contrast, now seen within the more distal small bowel.  Paucity of bowel gas without definite evidence of obstruction.  No supine evidence of pneumoperitoneum.  Enteric tube tip and sideport project over the expected location of the gastric fundus.  Limited visualization of the lower thorax demonstrates mild elevation of the right hemidiaphragm.  Mild multilevel lumbar spine degenerative change.  Osteopenia without definite fracture.  Vascular calcifications.  IMPRESSION: 1.  Minimal transit of previously ingested enteric contrast, now seen within the more distal small bowel, but has not yet reached the transverse colon.  2.  Nonobstructive bowel gas pattern.   Original Report Authenticated By: Tacey Ruiz, MD    Dg Abd Portable 1v  08/07/2012  *RADIOLOGY REPORT*  Clinical Data: Evaluate barium prior to potential gastrostomy tube placement  PORTABLE ABDOMEN - 1 VIEW  Comparison: 08/06/2012; CT abdomen pelvis - 12/09/2006  Findings:  Administered enteric contrast seen to the level of the mid/distal small bowel.  Evaluation of the bowel gas pattern is degraded secondary to patient motion artifact.  No definite evidence of obstruction. Evaluation for pneumoperitoneum is degraded secondary to patient motion artifact and exclusion of the lower thorax.  Osteopenia with suspected multilevel degenerative change.  Vascular calcifications.  IMPRESSION: Enteric contrast seen to the level of the mid/distal small bowel. No definite evidence of obstruction.   Original Report Authenticated By: Tacey Ruiz, MD    Dg Abd Portable 1v  08/06/2012  *RADIOLOGY REPORT*  Clinical Data: 79 year old  female NG tube placement.  PORTABLE ABDOMEN - 1 VIEW  Comparison: CT abdomen 12/09/2006.  Findings: Portable supine AP view 1720 hours.  NG tube in place, tip projects in the right upper quadrant, likely in the proximal duodenum.  Mild motion artifact. Nonobstructed bowel gas pattern. Grossly clear lung bases. Aorto iliac calcified atherosclerosis.  IMPRESSION: NG tube in place, tip at the level of the proximal duodenum.   Original Report Authenticated By: Erskine Speed, M.D.     Scheduled Meds:   . alteplase  4 mg Intracatheter Once  . alteplase  2 mg Intracatheter Once  . aspirin  300 mg Rectal Daily   Or  . aspirin  325 mg Oral Daily  .  ceFAZolin (ANCEF) IV  1 g Intravenous Once  . darbepoetin (ARANESP) injection - DIALYSIS  100 mcg Intravenous Q Sat-HD  . doxercalciferol  0.5 mcg Intravenous Q T,Th,Sa-HD  . fentaNYL      . heparin      . heparin  5,000 Units Subcutaneous Q8H  . midazolam      . mupirocin ointment  1 application Nasal BID  . sodium chloride  3 mL Intravenous Q12H   Continuous Infusions:   . dextrose 5 % and 0.9% NaCl 40 mL/hr at 08/04/12 4098    Active Problems:  Altered mental status  Syncope  ESRD (end stage renal disease)  Diabetes mellitus, type 2  HTN (hypertension)  Anemia  Sacral decubitus ulcer  FTT (failure to thrive) in adult  CVA (cerebral infarction)    Amandalynn Pitz A, MD  [facility] Pager [phone]. If 7PM-7AM, please contact night-coverage at [email or web address], password [facility] 08/08/2012, 8:52 AM  LOS: 6 days

## 2012-08-08 NOTE — Progress Notes (Signed)
S: talks incoherently but if directed can answer simple question O:BP 133/75  Pulse 104  Temp 99.2 F (37.3 C) (Axillary)  Resp 16  Ht 5\' 2"  (1.575 m)  Wt 45.4 kg (100 lb 1.4 oz)  BMI 18.31 kg/m2  SpO2 100%  Intake/Output Summary (Last 24 hours) at 08/08/12 0749 Last data filed at 08/07/12 1948  Gross per 24 hour  Intake    120 ml  Output   1240 ml  Net  -1120 ml   Weight change: 0 kg (0 lb) VHQ:IONGE and alert CVS:RRR Resp:clear ant Abd:+ BS NTND Ext:Bil BKA, Rt AVG no bruit.  Both arms flexed NEURO:OX1    . alteplase  4 mg Intracatheter Once  . aspirin  300 mg Rectal Daily   Or  . aspirin  325 mg Oral Daily  .  ceFAZolin (ANCEF) IV  1 g Intravenous Once  . darbepoetin (ARANESP) injection - DIALYSIS  100 mcg Intravenous Q Sat-HD  . doxercalciferol  0.5 mcg Intravenous Q T,Th,Sa-HD  . heparin  5,000 Units Subcutaneous Q8H  . mupirocin ointment  1 application Nasal BID  . sodium chloride  3 mL Intravenous Q12H   Dg Abd Portable 1v  08/07/2012  *RADIOLOGY REPORT*  Clinical Data: Percutaneous gastrostomy tube placement  PORTABLE ABDOMEN - 1 VIEW  Comparison: Earlier same day; 08/06/2012  Findings:  Minimal transit of previously administered enteric contrast, now seen within the more distal small bowel.  Paucity of bowel gas without definite evidence of obstruction.  No supine evidence of pneumoperitoneum.  Enteric tube tip and sideport project over the expected location of the gastric fundus.  Limited visualization of the lower thorax demonstrates mild elevation of the right hemidiaphragm.  Mild multilevel lumbar spine degenerative change.  Osteopenia without definite fracture.  Vascular calcifications.  IMPRESSION: 1.  Minimal transit of previously ingested enteric contrast, now seen within the more distal small bowel, but has not yet reached the transverse colon.  2.  Nonobstructive bowel gas pattern.   Original Report Authenticated By: Tacey Ruiz, MD    Dg Abd Portable  1v  08/07/2012  *RADIOLOGY REPORT*  Clinical Data: Evaluate barium prior to potential gastrostomy tube placement  PORTABLE ABDOMEN - 1 VIEW  Comparison: 08/06/2012; CT abdomen pelvis - 12/09/2006  Findings:  Administered enteric contrast seen to the level of the mid/distal small bowel.  Evaluation of the bowel gas pattern is degraded secondary to patient motion artifact.  No definite evidence of obstruction. Evaluation for pneumoperitoneum is degraded secondary to patient motion artifact and exclusion of the lower thorax.  Osteopenia with suspected multilevel degenerative change.  Vascular calcifications.  IMPRESSION: Enteric contrast seen to the level of the mid/distal small bowel. No definite evidence of obstruction.   Original Report Authenticated By: Tacey Ruiz, MD    Dg Abd Portable 1v  08/06/2012  *RADIOLOGY REPORT*  Clinical Data: 79 year old female NG tube placement.  PORTABLE ABDOMEN - 1 VIEW  Comparison: CT abdomen 12/09/2006.  Findings: Portable supine AP view 1720 hours.  NG tube in place, tip projects in the right upper quadrant, likely in the proximal duodenum.  Mild motion artifact. Nonobstructed bowel gas pattern. Grossly clear lung bases. Aorto iliac calcified atherosclerosis.  IMPRESSION: NG tube in place, tip at the level of the proximal duodenum.   Original Report Authenticated By: Erskine Speed, M.D.    BMET    Component Value Date/Time   NA 142 08/07/2012 9528   K 3.6 08/07/2012 4132   CL  101 08/07/2012 0712   CO2 27 08/07/2012 0712   GLUCOSE 149* 08/07/2012 0712   BUN 30* 08/07/2012 0712   CREATININE 5.34* 08/07/2012 0712   CALCIUM 8.3* 08/07/2012 0712   GFRNONAA 7* 08/07/2012 0712   GFRAA 8* 08/07/2012 0712   CBC    Component Value Date/Time   WBC 7.2 08/08/2012 0633   RBC 2.70* 08/08/2012 0633   HGB 7.1* 08/08/2012 0633   HCT 23.7* 08/08/2012 0633   PLT 241 08/08/2012 0633   MCV 87.8 08/08/2012 0633   MCH 26.3 08/08/2012 0633   MCHC 30.0 08/08/2012 0633   RDW 18.7* 08/08/2012  0633   LYMPHSABS 1.5 08/02/2012 1651   MONOABS 0.7 08/02/2012 1651   EOSABS 0.0 08/02/2012 1651   BASOSABS 0.0 08/02/2012 1651     Assessment: 1. Acute stroke 2. Anemia 3. Sec HPTH 4. ESRD 5. Clotted access  Plan: 1. Plan was for AVG to be declotted yest but has not been done ? If unable to get consent but I discussed with IR that this is an emergency.  Spoke with IR, it is suppose to be done today 2. Plan HD after declot   Velna Hedgecock T

## 2012-08-08 NOTE — Procedures (Signed)
Declot HD graft, Venous PTA No complication No blood loss. See complete dictation in [facility].

## 2012-08-08 NOTE — Procedures (Signed)
Pt seen on HD.  Ap 170 Vp 150  SBP 160.  Will give 2 units prbc's

## 2012-08-08 NOTE — Progress Notes (Signed)
HYDROTHERAPY NOTE  08/08/12 1235  Subjective Assessment  Subjective pt moaning and verbalizing something unintelligible  Wound 08/02/12 Other (Comment) Back Mid;Lower  Date First Assessed/Time First Assessed: 08/02/12 1820   Wound Type: (c) Other (Comment)  Location: Back  Location Orientation: Mid;Lower  Present on Admission: Yes  Site / Wound Assessment Pink;Red;Yellow  % Wound base Red or Granulating 70%  % Wound base Yellow 20%  % Wound base Black 5% (less than)  % Wound base Other (Comment) 5% (greater than)  Peri-wound Assessment Pink;Erythema (blanchable)  Margins Unattacted edges (unapproximated)  Closure None  Drainage Amount Moderate  Drainage Description Serosanguineous  Non-staged Wound Description Full thickness  Treatment Hydrotherapy (Pulse lavage);Cleansed;Packing (Saline gauze)  Dressing Type ABD;Other (Comment);Barrier Film (skin prep);Gauze (Comment)  Dressing Changed Changed  Dressing Status Dry;Intact  Hydrotherapy  Pulsed Lavage with Suction (psi) 4 psi  Pulsed Lavage with Suction - Normal Saline Used 1000 mL  Pulsed Lavage Tip Tip with splash shield  Pulsed lavage therapy - wound location Sacral wound  Selective Debridement  Selective Debridement - Location sacral wound  Selective Debridement - Tools Used Forceps;Scalpel;Scissors  Selective Debridement - Tissue Removed larger area of black removed at 9 oclock  Wound Therapy - Assess/Plan/Recommendations  Wound Therapy - Clinical Statement Wound continues to heal despite clearly not getting good nutrition (going for PEG)  Wound Therapy - Functional Problem List decreased skin integrity, decreased functional mobility  Factors Delaying/Impairing Wound Healing Altered sensation;Diabetes Mellitus;Multiple medical problems;Vascular compromise;Infection - systemic/local  Hydrotherapy Plan Debridement;Patient/family education;Pulsatile lavage with suction  Wound Therapy - Frequency 6X / week  Wound Plan Will see  6x a week as indicated with PLS, selective debridement and dressing changes  Wound Therapy Goals - Improve the function of patient's integumentary system by progressing the wound(s) through the phases of wound healing by:  Decrease Necrotic Tissue - Progress Progressing toward goal  Increase Granulation Tissue - Progress Progressing toward goal  Improve Drainage Characteristics - Progress Progressing toward goal  Time For Goal Achievement 7 days  Wound Therapy - Potential for Goals Fair   08/08/2012  Alamo Bing, PT [phone] (pager)

## 2012-08-09 ENCOUNTER — Inpatient Hospital Stay (HOSPITAL_COMMUNITY): Payer: Medicare Other

## 2012-08-09 LAB — BASIC METABOLIC PANEL
BUN: 13 mg/dL (ref 6–23)
CO2: 27 mEq/L (ref 19–32)
GFR calc non Af Amer: 18 mL/min — ABNORMAL LOW (ref >90)
Glucose, Bld: 118 mg/dL — ABNORMAL HIGH (ref 70–99)
Potassium: 4.3 mEq/L (ref 3.5–5.1)
Sodium: 144 mEq/L (ref 135–145)

## 2012-08-09 LAB — CBC
HCT: 35.5 % — ABNORMAL LOW (ref 36.0–46.0)
Hemoglobin: 11.2 g/dL — ABNORMAL LOW (ref 12.0–15.0)
MCH: 27.9 pg (ref 26.0–34.0)
MCHC: 31.5 g/dL (ref 30.0–36.0)
RBC: 4.01 MIL/uL (ref 3.87–5.11)

## 2012-08-09 LAB — TYPE AND SCREEN: Unit division: 0

## 2012-08-09 LAB — GLUCOSE, CAPILLARY
Glucose-Capillary: 114 mg/dL — ABNORMAL HIGH (ref 70–99)
Glucose-Capillary: 135 mg/dL — ABNORMAL HIGH (ref 70–99)

## 2012-08-09 MED ORDER — MIDAZOLAM HCL 2 MG/2ML IJ SOLN
INTRAMUSCULAR | Status: AC | PRN
  Administered 2012-08-09: 1 mg via INTRAVENOUS
  Administered 2012-08-09: 0.5 mg via INTRAVENOUS

## 2012-08-09 MED ORDER — IOHEXOL 300 MG/ML  SOLN
50.0000 mL | Freq: Once | INTRAMUSCULAR | Status: AC | PRN
  Administered 2012-08-09: 20 mL

## 2012-08-09 MED ORDER — FENTANYL CITRATE 0.05 MG/ML IJ SOLN
INTRAMUSCULAR | Status: AC | PRN
  Administered 2012-08-09: 50 ug via INTRAVENOUS
  Administered 2012-08-09: 25 ug via INTRAVENOUS

## 2012-08-09 NOTE — Progress Notes (Signed)
PT HYDROTHERAPY PROGRESS NOTE   08/09/12 1000  Subjective Assessment  Subjective Pt perseverating on the phrase "damn hands"  Date of Onset (prior to admission)  Wound 08/02/12 Other (Comment) Back Mid;Lower  Date First Assessed/Time First Assessed: 08/02/12 1820   Wound Type: (c) Other (Comment)  Location: Back  Location Orientation: Mid;Lower  Present on Admission: Yes  Site / Wound Assessment Pink;Red;Yellow  % Wound base Red or Granulating 70%  % Wound base Yellow 20%  % Wound base Black 5% (less than)  % Wound base Other (Comment) 5% (greater than)  Peri-wound Assessment Pink;Erythema (blanchable)  Margins Unattacted edges (unapproximated)  Closure None  Drainage Amount Moderate  Drainage Description Serosanguineous  Non-staged Wound Description Full thickness  Treatment Hydrotherapy (Pulse lavage);Packing (Saline gauze);Tape changed  Dressing Type ABD;Other (Comment);Barrier Film (skin prep);Gauze (Comment)  Dressing Changed Changed  Dressing Status Dry;Intact  Hydrotherapy  Pulsed Lavage with Suction (psi) 4 psi  Pulsed Lavage with Suction - Normal Saline Used 1000 mL  Pulsed Lavage Tip Tip with splash shield  Pulsed lavage therapy - wound location Sacral wound  Selective Debridement  Selective Debridement - Location sacral wound  Selective Debridement - Tools Used Forceps;Scalpel;Scissors  Selective Debridement - Tissue Removed larger area of black removed at 9 oclock  Wound Therapy - Assess/Plan/Recommendations  Wound Therapy - Clinical Statement Wound bed continues to present with increased red beefy granulation, and less necrotic tissue. Will continue hydrotherapy to promote wound healing and increase viable tissue. Will perform reassessment next visit.    Wound Therapy - Functional Problem List decreased skin integrity, decreased functional mobility  Factors Delaying/Impairing Wound Healing Altered sensation;Diabetes Mellitus;Multiple medical problems;Vascular  compromise;Infection - systemic/local  Hydrotherapy Plan Debridement;Patient/family education;Pulsatile lavage with suction  Wound Therapy - Frequency 6X / week  Wound Plan Will see 6x a week as indicated with PLS, selective debridement and dressing changes  Wound Therapy Goals - Improve the function of patient's integumentary system by progressing the wound(s) through the phases of wound healing by:  Decrease Necrotic Tissue - Progress Progressing toward goal  Increase Granulation Tissue - Progress Progressing toward goal  Improve Drainage Characteristics - Progress Progressing toward goal  Time For Goal Achievement 7 days  Wound Therapy - Potential for Goals Fair     Charlotte Crumb, PT DPT  [phone]

## 2012-08-09 NOTE — Progress Notes (Signed)
S: talks incoherently and difficult to redirect O:BP 147/86  Pulse 88  Temp 97.9 F (36.6 C) (Oral)  Resp 18  Ht 5\' 2"  (1.575 m)  Wt 43.4 kg (95 lb 10.9 oz)  BMI 17.50 kg/m2  SpO2 97%  Intake/Output Summary (Last 24 hours) at 08/09/12 0758 Last data filed at 08/08/12 1746  Gross per 24 hour  Intake    700 ml  Output   1642 ml  Net   -942 ml   Weight change: -2 kg (-4 lb 6.6 oz) BMW:UXLKG and alert CVS:RRR Resp:clear ant Abd:+ BS NTND Ext:Bil BKA, Rt AVG + bruit.  Both arms flexed NEURO: Awake and alert but somewhat delerious    . alteplase  4 mg Intracatheter Once  . aspirin  300 mg Rectal Daily   Or  . aspirin  325 mg Oral Daily  .  ceFAZolin (ANCEF) IV  1 g Intravenous Once  . darbepoetin (ARANESP) injection - DIALYSIS  100 mcg Intravenous Q Sat-HD  . doxercalciferol  0.5 mcg Intravenous Q [name],Th,Sa-HD  . heparin  5,000 Units Subcutaneous Q8H  . sodium chloride  3 mL Intravenous Q12H   Ir Pta Venous Right  08/08/2012  *RADIOLOGY REPORT*  Clinical data:  Occluded dialysis graft.  Previous declot and venous anastomotic PTA 11/16/2011.  DIALYSIS GRAFT DECLOT VENOUS ANGIOPLASTY ULTRASOUND GUIDANCE FOR VASCULAR ACCESS X2  Comparison: 11/16/2011  Technique: The procedure, risks (including but not limited to bleeding, infection, organ damage), benefits, and alternatives were explained to the patient.  Questions regarding the procedure were encouraged and answered.  The patient understands and consents to the procedure.  Intravenous Fentanyl and Versed were administered as conscious sedation during continuous cardiorespiratory monitoring by the radiology RN, with a total moderate sedation time of 40 minutes.   The graft just central to the arterial anastomosis was accessed antegrade with a 21-gauge micropuncture needle under real-time ultrasonic guidance after the overlying skin prepped with Betadine, draped in usual sterile fashion, infiltrated locally with 1% lidocaine. Needle  exchanged over 018 guidewire for transitional dilator through which 2 mg [name]-PA was administered. Ultrasound imaging documentation was saved. Through the antegrade dilator, a Bentson wire was advanced to the venous anastomosis. Over this a 66F sheath was placed, through which a 5 [place] Kumpe catheter was advanced for outflow venography. This showed patency of the outflow venous system through the SVC. 3000 units heparin were administered. The Arrow PTD device was used to macerate thrombus in the graft. Injection showed   clearance of thrombus from the graft.   In similar fashion, the more central aspect of the graft was accessed retrograde under ultrasound with a micropuncture needle, exchanged for a transitional dilator.The venous limb dilator was exchanged in similar fashion for a 6 French vascular sheath. The Kumpe catheter was advanced and used to gain access to the proximal brachial artery.  The Kumpe exchanged for a the Fogarty catheter, used to dislodge the platelet plug  into the   graft, where it was macerated. The Fogarty was used again to remove any residual platelet plug from the arterial anastomosis.  A follow shuntogram was performed, demonstrating good flow through the outflow vein, no extravasation.  Balloon angioplasty of the graft and venous anastomosis was performed using a 7 mm x 4 cm Conquest angioplasty balloon with good response.  Balloon was removed and injection showed patency of the anastomosis, without extravasation or other apparent complication.  Reflux across the arterial anastomosis demonstrate this to be widely patent, with unremarkable  appearance of the visualized native arterial circulation. The catheter and sheaths were then removed and hemostasis achieved with 2-0 Ethilon sutures. Patient tolerated procedure well.  IMPRESSION  1. Technically successful declot of right upper arm synthetic straight      hemodialysis graft.  2. Technically successful balloon angioplasty of venous  anastomotic stenosis.  Access management: Remains approachable for percutaneous intervention as needed.   Original Report Authenticated By: D. Andria Rhein, MD    Ir Angio Av Shunt Addl Access  08/08/2012  *RADIOLOGY REPORT*  Clinical data:  Occluded dialysis graft.  Previous declot and venous anastomotic PTA 11/16/2011.  DIALYSIS GRAFT DECLOT VENOUS ANGIOPLASTY ULTRASOUND GUIDANCE FOR VASCULAR ACCESS X2  Comparison: 11/16/2011  Technique: The procedure, risks (including but not limited to bleeding, infection, organ damage), benefits, and alternatives were explained to the patient.  Questions regarding the procedure were encouraged and answered.  The patient understands and consents to the procedure.  Intravenous Fentanyl and Versed were administered as conscious sedation during continuous cardiorespiratory monitoring by the radiology RN, with a total moderate sedation time of 40 minutes.   The graft just central to the arterial anastomosis was accessed antegrade with a 21-gauge micropuncture needle under real-time ultrasonic guidance after the overlying skin prepped with Betadine, draped in usual sterile fashion, infiltrated locally with 1% lidocaine. Needle exchanged over 018 guidewire for transitional dilator through which 2 mg [name]-PA was administered. Ultrasound imaging documentation was saved. Through the antegrade dilator, a Bentson wire was advanced to the venous anastomosis. Over this a 27F sheath was placed, through which a 5 [place] Kumpe catheter was advanced for outflow venography. This showed patency of the outflow venous system through the SVC. 3000 units heparin were administered. The Arrow PTD device was used to macerate thrombus in the graft. Injection showed   clearance of thrombus from the graft.   In similar fashion, the more central aspect of the graft was accessed retrograde under ultrasound with a micropuncture needle, exchanged for a transitional dilator.The venous limb dilator was exchanged in  similar fashion for a 6 French vascular sheath. The Kumpe catheter was advanced and used to gain access to the proximal brachial artery.  The Kumpe exchanged for a the Fogarty catheter, used to dislodge the platelet plug  into the   graft, where it was macerated. The Fogarty was used again to remove any residual platelet plug from the arterial anastomosis.  A follow shuntogram was performed, demonstrating good flow through the outflow vein, no extravasation.  Balloon angioplasty of the graft and venous anastomosis was performed using a 7 mm x 4 cm Conquest angioplasty balloon with good response.  Balloon was removed and injection showed patency of the anastomosis, without extravasation or other apparent complication.  Reflux across the arterial anastomosis demonstrate this to be widely patent, with unremarkable appearance of the visualized native arterial circulation. The catheter and sheaths were then removed and hemostasis achieved with 2-0 Ethilon sutures. Patient tolerated procedure well.  IMPRESSION  1. Technically successful declot of right upper arm synthetic straight      hemodialysis graft.  2. Technically successful balloon angioplasty of venous anastomotic stenosis.  Access management: Remains approachable for percutaneous intervention as needed.   Original Report Authenticated By: D. Andria Rhein, MD    Ir US Guide Vasc Access Right  08/08/2012  *RADIOLOGY REPORT*  Clinical data:  Occluded dialysis graft.  Previous declot and venous anastomotic PTA 11/16/2011.  DIALYSIS GRAFT DECLOT VENOUS ANGIOPLASTY ULTRASOUND GUIDANCE FOR VASCULAR ACCESS X2  Comparison:  11/16/2011  Technique: The procedure, risks (including but not limited to bleeding, infection, organ damage), benefits, and alternatives were explained to the patient.  Questions regarding the procedure were encouraged and answered.  The patient understands and consents to the procedure.  Intravenous Fentanyl and Versed were administered as conscious  sedation during continuous cardiorespiratory monitoring by the radiology RN, with a total moderate sedation time of 40 minutes.   The graft just central to the arterial anastomosis was accessed antegrade with a 21-gauge micropuncture needle under real-time ultrasonic guidance after the overlying skin prepped with Betadine, draped in usual sterile fashion, infiltrated locally with 1% lidocaine. Needle exchanged over 018 guidewire for transitional dilator through which 2 mg [name]-PA was administered. Ultrasound imaging documentation was saved. Through the antegrade dilator, a Bentson wire was advanced to the venous anastomosis. Over this a 48F sheath was placed, through which a 5 [place] Kumpe catheter was advanced for outflow venography. This showed patency of the outflow venous system through the SVC. 3000 units heparin were administered. The Arrow PTD device was used to macerate thrombus in the graft. Injection showed   clearance of thrombus from the graft.   In similar fashion, the more central aspect of the graft was accessed retrograde under ultrasound with a micropuncture needle, exchanged for a transitional dilator.The venous limb dilator was exchanged in similar fashion for a 6 French vascular sheath. The Kumpe catheter was advanced and used to gain access to the proximal brachial artery.  The Kumpe exchanged for a the Fogarty catheter, used to dislodge the platelet plug  into the   graft, where it was macerated. The Fogarty was used again to remove any residual platelet plug from the arterial anastomosis.  A follow shuntogram was performed, demonstrating good flow through the outflow vein, no extravasation.  Balloon angioplasty of the graft and venous anastomosis was performed using a 7 mm x 4 cm Conquest angioplasty balloon with good response.  Balloon was removed and injection showed patency of the anastomosis, without extravasation or other apparent complication.  Reflux across the arterial anastomosis  demonstrate this to be widely patent, with unremarkable appearance of the visualized native arterial circulation. The catheter and sheaths were then removed and hemostasis achieved with 2-0 Ethilon sutures. Patient tolerated procedure well.  IMPRESSION  1. Technically successful declot of right upper arm synthetic straight      hemodialysis graft.  2. Technically successful balloon angioplasty of venous anastomotic stenosis.  Access management: Remains approachable for percutaneous intervention as needed.   Original Report Authenticated By: D. Andria Rhein, MD    Dg Abd Portable 1v  08/07/2012  *RADIOLOGY REPORT*  Clinical Data: Percutaneous gastrostomy tube placement  PORTABLE ABDOMEN - 1 VIEW  Comparison: Earlier same day; 08/06/2012  Findings:  Minimal transit of previously administered enteric contrast, now seen within the more distal small bowel.  Paucity of bowel gas without definite evidence of obstruction.  No supine evidence of pneumoperitoneum.  Enteric tube tip and sideport project over the expected location of the gastric fundus.  Limited visualization of the lower thorax demonstrates mild elevation of the right hemidiaphragm.  Mild multilevel lumbar spine degenerative change.  Osteopenia without definite fracture.  Vascular calcifications.  IMPRESSION: 1.  Minimal transit of previously ingested enteric contrast, now seen within the more distal small bowel, but has not yet reached the transverse colon.  2.  Nonobstructive bowel gas pattern.   Original Report Authenticated By: Tacey Ruiz, MD    Ir Declot Right Mod Sed  08/08/2012  *RADIOLOGY REPORT*  Clinical data:  Occluded dialysis graft.  Previous declot and venous anastomotic PTA 11/16/2011.  DIALYSIS GRAFT DECLOT VENOUS ANGIOPLASTY ULTRASOUND GUIDANCE FOR VASCULAR ACCESS X2  Comparison: 11/16/2011  Technique: The procedure, risks (including but not limited to bleeding, infection, organ damage), benefits, and alternatives were explained to the  patient.  Questions regarding the procedure were encouraged and answered.  The patient understands and consents to the procedure.  Intravenous Fentanyl and Versed were administered as conscious sedation during continuous cardiorespiratory monitoring by the radiology RN, with a total moderate sedation time of 40 minutes.   The graft just central to the arterial anastomosis was accessed antegrade with a 21-gauge micropuncture needle under real-time ultrasonic guidance after the overlying skin prepped with Betadine, draped in usual sterile fashion, infiltrated locally with 1% lidocaine. Needle exchanged over 018 guidewire for transitional dilator through which 2 mg [name]-PA was administered. Ultrasound imaging documentation was saved. Through the antegrade dilator, a Bentson wire was advanced to the venous anastomosis. Over this a 45F sheath was placed, through which a 5 [place] Kumpe catheter was advanced for outflow venography. This showed patency of the outflow venous system through the SVC. 3000 units heparin were administered. The Arrow PTD device was used to macerate thrombus in the graft. Injection showed   clearance of thrombus from the graft.   In similar fashion, the more central aspect of the graft was accessed retrograde under ultrasound with a micropuncture needle, exchanged for a transitional dilator.The venous limb dilator was exchanged in similar fashion for a 6 French vascular sheath. The Kumpe catheter was advanced and used to gain access to the proximal brachial artery.  The Kumpe exchanged for a the Fogarty catheter, used to dislodge the platelet plug  into the   graft, where it was macerated. The Fogarty was used again to remove any residual platelet plug from the arterial anastomosis.  A follow shuntogram was performed, demonstrating good flow through the outflow vein, no extravasation.  Balloon angioplasty of the graft and venous anastomosis was performed using a 7 mm x 4 cm Conquest angioplasty  balloon with good response.  Balloon was removed and injection showed patency of the anastomosis, without extravasation or other apparent complication.  Reflux across the arterial anastomosis demonstrate this to be widely patent, with unremarkable appearance of the visualized native arterial circulation. The catheter and sheaths were then removed and hemostasis achieved with 2-0 Ethilon sutures. Patient tolerated procedure well.  IMPRESSION  1. Technically successful declot of right upper arm synthetic straight      hemodialysis graft.  2. Technically successful balloon angioplasty of venous anastomotic stenosis.  Access management: Remains approachable for percutaneous intervention as needed.   Original Report Authenticated By: D. Andria Rhein, MD    Ir Radiologist Eval & Mgmt  08/08/2012  *RADIOLOGY REPORT*  Clinical data:  Occluded dialysis graft.  Previous declot and venous anastomotic PTA 11/16/2011.  DIALYSIS GRAFT DECLOT VENOUS ANGIOPLASTY ULTRASOUND GUIDANCE FOR VASCULAR ACCESS X2  Comparison: 11/16/2011  Technique: The procedure, risks (including but not limited to bleeding, infection, organ damage), benefits, and alternatives were explained to the patient.  Questions regarding the procedure were encouraged and answered.  The patient understands and consents to the procedure.  Intravenous Fentanyl and Versed were administered as conscious sedation during continuous cardiorespiratory monitoring by the radiology RN, with a total moderate sedation time of 40 minutes.   The graft just central to the arterial anastomosis was accessed antegrade with a 21-gauge micropuncture needle  under real-time ultrasonic guidance after the overlying skin prepped with Betadine, draped in usual sterile fashion, infiltrated locally with 1% lidocaine. Needle exchanged over 018 guidewire for transitional dilator through which 2 mg [name]-PA was administered. Ultrasound imaging documentation was saved. Through the antegrade dilator, a  Bentson wire was advanced to the venous anastomosis. Over this a 75F sheath was placed, through which a 5 [place] Kumpe catheter was advanced for outflow venography. This showed patency of the outflow venous system through the SVC. 3000 units heparin were administered. The Arrow PTD device was used to macerate thrombus in the graft. Injection showed   clearance of thrombus from the graft.   In similar fashion, the more central aspect of the graft was accessed retrograde under ultrasound with a micropuncture needle, exchanged for a transitional dilator.The venous limb dilator was exchanged in similar fashion for a 6 French vascular sheath. The Kumpe catheter was advanced and used to gain access to the proximal brachial artery.  The Kumpe exchanged for a the Fogarty catheter, used to dislodge the platelet plug  into the   graft, where it was macerated. The Fogarty was used again to remove any residual platelet plug from the arterial anastomosis.  A follow shuntogram was performed, demonstrating good flow through the outflow vein, no extravasation.  Balloon angioplasty of the graft and venous anastomosis was performed using a 7 mm x 4 cm Conquest angioplasty balloon with good response.  Balloon was removed and injection showed patency of the anastomosis, without extravasation or other apparent complication.  Reflux across the arterial anastomosis demonstrate this to be widely patent, with unremarkable appearance of the visualized native arterial circulation. The catheter and sheaths were then removed and hemostasis achieved with 2-0 Ethilon sutures. Patient tolerated procedure well.  IMPRESSION  1. Technically successful declot of right upper arm synthetic straight      hemodialysis graft.  2. Technically successful balloon angioplasty of venous anastomotic stenosis.  Access management: Remains approachable for percutaneous intervention as needed.   Original Report Authenticated By: D. Andria Rhein, MD    BMET      Component Value Date/Time   NA 144 08/08/2012 0633   K 3.9 08/08/2012 0633   CL 105 08/08/2012 0633   CO2 28 08/08/2012 0633   GLUCOSE 203* 08/08/2012 0633   BUN 27* 08/08/2012 0633   CREATININE 4.67* 08/08/2012 0633   CALCIUM 8.4 08/08/2012 0633   GFRNONAA 8* 08/08/2012 0633   GFRAA 9* 08/08/2012 0633   CBC    Component Value Date/Time   WBC 7.2 08/08/2012 0633   RBC 2.70* 08/08/2012 0633   HGB 7.1* 08/08/2012 0633   HCT 23.7* 08/08/2012 0633   PLT 241 08/08/2012 0633   MCV 87.8 08/08/2012 0633   MCH 26.3 08/08/2012 0633   MCHC 30.0 08/08/2012 0633   RDW 18.7* 08/08/2012 0633   LYMPHSABS 1.5 08/02/2012 1651   MONOABS 0.7 08/02/2012 1651   EOSABS 0.0 08/02/2012 1651   BASOSABS 0.0 08/02/2012 1651     Assessment: 1. Acute stroke 2. Anemia  SP transfusion yest 3. Sec HPTH 4. ESRD, HD yest 5. Clotted access  SP Declot  Plan: 1. I think we seriously have to question the role of continued dialysis.  Her quality of life is poor from recent stroke without much improvement in the deficits..  In addition she has a stage 4 decubitus ulcer.  I feel dialysis is keeping her alive to be miserable.  I would recommend a palliative care consult to discuss  these issues.   Linda [name]

## 2012-08-09 NOTE — Progress Notes (Signed)
NUTRITION FOLLOW UP  Intervention:   1.Recommend: Two 8 oz can Nepro BID (4 cans total) via PEG which will provide 1700 kcals and 76 grams of protein and 688 ml free water.    2. Recommend 100 ml free water flush q6 hr to provide a daily total of 1088 ml free water.   3. RD will continue to follow  Nutrition Dx:   Inadequate oral intake related to inability to eat as evidenced by NPO status  Goal:   Meet >/=90% estimated needs via PEG  Monitor:   Weight trends, I/O's, PO's, labs  Assessment:   Pt having PEG placed today. Nurse unavailable to talk at this time. Documented 25-50% meal completion.  Will meet pt's total nutritional needs with bolus feedings via PEG and will allow her to eat for pleasure.  Prior to PEG placement pt was being followed by SLP and eating a D1-pudding diet  Height: Ht Readings from Last 1 Encounters:  08/07/12 5\' 2"  (1.575 m)    Weight Status:   Wt Readings from Last 1 Encounters:  08/08/12 95 lb 10.9 oz (43.4 kg)    Re-estimated needs:  Kcal: 1500-1700 Protein: 65-75 grams      Fluid:  Urine output + 1000 cc/day  Skin: Stage IV pressure ulcer on lower back   Diet Order: NPO   Intake/Output Summary (Last 24 hours) at 08/09/12 1314 Last data filed at 08/08/12 1746  Gross per 24 hour  Intake    700 ml  Output   1642 ml  Net   -942 ml    Last BM: 1/22   Labs:   Lab 08/09/12 0855 08/08/12 0633 08/07/12 0712 08/06/12 0652  NA 144 144 142 --  K 4.3 3.9 3.6 --  CL 102 105 101 --  CO2 27 28 27  --  BUN 13 27* 30* --  CREATININE 2.41* 4.67* 5.34* --  CALCIUM 8.5 8.4 8.3* --  MG -- -- -- --  PHOS -- 4.4 4.6 4.5  GLUCOSE 118* 203* 149* --    CBG (last 3)   Basename 08/09/12 1205 08/09/12 0820 08/09/12 0352  GLUCAP 114* 114* 107*    Scheduled Meds:   . alteplase  4 mg Intracatheter Once  . aspirin  300 mg Rectal Daily   Or  . aspirin  325 mg Oral Daily  .  ceFAZolin (ANCEF) IV  1 g Intravenous Once  . darbepoetin  (ARANESP) injection - DIALYSIS  100 mcg Intravenous Q Sat-HD  . doxercalciferol  0.5 mcg Intravenous Q T,Th,Sa-HD  . heparin  5,000 Units Subcutaneous Q8H  . sodium chloride  3 mL Intravenous Q12H    Continuous Infusions:   . dextrose 5 % and 0.9% NaCl 40 mL/hr at 08/04/12 1610      [facility] Dietetic Intern # [phone]    Agree with note, additions and edits added in blue.  Clarene Duke RD, LDN Pager [phone] After Hours pager [phone]

## 2012-08-09 NOTE — Procedures (Signed)
Interventional Radiology Procedure Note  Procedure: Placement of percutaneous 44F pull-through gastrostomy tube. Complications: None Recommendations: - NPO except for sips and chips remainder of today and overnight - Maintain G-tube to LWS until tomorrow morning  - May advance diet as tolerated and begin using tube tomorrow (08/10/12) at noon  Signed,  Sterling Big, MD Vascular & Interventional Radiologist [facility]iology

## 2012-08-09 NOTE — Progress Notes (Signed)
[facility] PROGRESS NOTE  Linda [name] RUE:[id number] DOB: August 30, 1930 DOA: 08/02/2012 PCP: Colon Branch, MD Brief Narrative: This is a 79 year old female nursing facility resident, with known history of ESRD on HD T-T-S, chronic anemia, DM, dyslipidemia, secondary hyperparathyroidism, HTN, PVD, s/p bilateral BKA, GERD, sacral decubitus s/p recent debridement,s/p recent hospitalization at [facility], for pyelonephritis, treated with Tobramycin, then discharged on Vancomycin with HD. Apparently, patient is s/p HD on 08/02/12, which she completed without incident, then en route to the nursing facility, she had an episode of unresponsiveness, associated with facial drooping, lasting for about 20 mins, before patient arrived at the nursing facility. She was then brought to the ED. Per ED MD, patient on arrival, was asymptomatic and at baseline mental status. She was admitted for further evaluation and management.   Assessment/Plan: Altered mental status/CVA Patient presented with what was described as an episode of very poor responsiveness, associated with facial droop, following HD. Clinically, she does have some weakness of left upper extremity. Head CT scan revealed an infarct within the right parietal lobe which may be subacute to chronic. Patient reportedly, was last seen normal at 2:15 PM on 08/02/12, so she was outside the window for t-PA, and symptoms had improved on arrival, although her other co-morbidities, including a moderate anemia, may also have been contraindications. Dr Wyatt Portela provided neurology consultation and CVA w/u, carried out. Brain MRI showed subcentimeter left frontal subcortical white matter infarct, approximately 5 mm in diameter. No hemorrhage. Advanced atrophy and extensive chronic microvascular ischemic change. Remote ischemic events are stable. MRA revealed potentially flow reducing lesion supraclinoid internal carotid artery on the right, estimated  50-75% stenosis. Widespread intracranial atherosclerotic disease elsewhere. 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 Carotid doppler showed no evidence of hemodynamically significant internal carotid artery stenosis. Vertebral artery flow is antegrade. 2D Echocardiogram revealed normal LV cavity size, mild LVH and EF of 65% to 70%. Continued on anti-platelet medication. EKG shows SR, with old Q-waves in V1-V2, but no acute ischemic changes. SLP has evaluated and recommended Dysphagia 1 with pudding thick consistency.  Due to failure to thrive, PEG was placed today for nutrition. Patient may benefit a discussion about palliative care and goals of care at SNF, plan was discussed with patient's daughter who is agreeable.  ESRD (end stage renal disease) Patient is on HD T-T-S, and is s/p hemodialysis on 08/02/12. Dr Delano Metz provided nephrology consultation and regular dialysis schedule has been reinstated. Patient was found on 08/07/12, patient to have a clotted AV-fistula and had declotting performed on 08/08/2012.   Diabetes mellitus, type 2 Patient has insulin-requiring type 2 diabetes mellitus. CBG was 66 on presentation, but came up to 80, while in the ED. As patient is clearly at high risk of hypoglycemia, given ESRD, insulin therapy and NPO status, she has been placed on dextrose infusion, albeit at low rate of 40cc/hr, to avoid fluid overload. All insulin is on hold, and CBGs have remained satisfactory.   HTN (hypertension) BP improved. Continue to monitor. Will avoid aggressive BP control.   Anemia Normocytic and due to chronic disease and ESRD. Following CBC, but HB is gradually drifting down, and transfusion may prove necessary.   Sacral decubitus ulcer Patient has a rather large, approximately 8 cm x 9 cm stage 4 sacral decubitus, and is reportedly, s/p recent debridement. Wound shows evidence of granulation, and  does not appear overly infected. Utilizing mattress overlay. Patient  has been evaluated by [facility], has undergone further sharp debridement on 08/04/12, as well as hydrotherapy. Managing as recommended.   Recent pyelonephritis Patient is reportedly, s/p recent hospitalization at [facility], for a pyelonephritis, for which she was treated with Tobramycin, then Vancomycin. She is afebrile, and does not look toxic. Repeat urinalysis revealed pyuria, but culture showed no growth.  Failure to thrive  It appears that patient has had FTT and poor oral intake, and that the question of possible PEG was discussed at SNF. Have discussed with daughter on 08/04/12, and after conferring with family, they have elected to proceed. PEG placed on 08/09/2012 to start tube feeds on 08/10/2012.    Code Status: Full Code.  Family Communication: Discussed with patient's daughter [name] [phone].  Disposition Plan: Plan for DC to SNF tomorrow.    Consultants:  Dr. Leroy Kennedy, neurology  Dr. Schertz/Dr. Briant Cedar, renal  Procedures:  Head CT  CXR  Antibiotics:  None.  HPI/Subjective: Sleepy after the procedure.  Objective: Filed Vitals:   08/09/12 1325 08/09/12 1330 08/09/12 1335 08/09/12 1340  BP: 168/65 135/69 131/75 170/60  Pulse: 92 91 101 94  Temp:      TempSrc:      Resp: 21 13 14 12   Height:      Weight:      SpO2: 100% 100% 100% 100%    Intake/Output Summary (Last 24 hours) at 08/09/12 1421 Last data filed at 08/08/12 1746  Gross per 24 hour  Intake    700 ml  Output   1642 ml  Net   -942 ml   Filed Weights   08/07/12 0645 08/07/12 0800 08/08/12 1334  Weight: 45.4 kg (100 lb 1.4 oz) 45.4 kg (100 lb 1.4 oz) 43.4 kg (95 lb 10.9 oz)    Exam: Physical Exam: General: Sleepy, not oriented, No acute distress. HEENT: EOMI, NG in place. Neck: Supple CV: S1 and S2 Lungs: Clear to ascultation bilaterally Abdomen: Soft, Nontender, Nondistended, +bowel sounds, PEG in place. Ext:  Good pulses. Trace edema.  Data Reviewed: Basic Metabolic Panel:  Lab 08/09/12 0981 08/08/12 1914 08/07/12 7829 08/06/12 0652 08/05/12 0640 08/04/12 0550  NA 144 144 142 143 138 --  K 4.3 3.9 3.6 3.5 3.5 --  CL 102 105 101 104 99 --  CO2 27 28 27 26 27  --  GLUCOSE 118* 203* 149* 151* 102* --  BUN 13 27* 30* 25* 19 --  CREATININE 2.41* 4.67* 5.34* 4.58* 3.50* --  CALCIUM 8.5 8.4 8.3* 8.2* 8.4 --  MG -- -- -- -- -- --  PHOS -- 4.4 4.6 4.5 3.7 4.9*   Liver Function Tests:  Lab 08/08/12 5621 08/07/12 0712 08/06/12 0652 08/05/12 0640 08/04/12 0550 08/02/12 1651  AST -- -- -- -- -- 14  ALT -- -- -- -- -- <5  ALKPHOS -- -- -- -- -- 106  BILITOT -- -- -- -- -- 0.1*  PROT -- -- -- -- -- 7.2  ALBUMIN 2.1* 2.1* 2.0* 2.1* 1.9* --   No results found for this basename: LIPASE:5,AMYLASE:5 in the last 168 hours No results found for this basename: AMMONIA:5 in the last 168 hours CBC:  Lab 08/09/12 0855 08/08/12 0633 08/07/12 0824 08/06/12 0652 08/05/12 0640 08/02/12 1651  WBC 13.1* 7.2 8.9 6.6 8.3 --  NEUTROABS -- -- -- -- -- 13.1*  HGB 11.2* 7.1* 7.7* 7.6* 7.8* --  HCT 35.5* 23.7* 24.8* 24.8* 26.0* --  MCV 88.5 87.8 86.1 86.7 88.4 --  PLT 191  241 267 266 288 --   Cardiac Enzymes:  Lab 08/02/12 1652  CKTOTAL --  CKMB --  CKMBINDEX --  TROPONINI <0.30   BNP (last 3 results)  Basename 09/14/11 1223  PROBNP 8852.0*   CBG:  Lab 08/09/12 1205 08/09/12 0820 08/09/12 0352 08/08/12 2338 08/08/12 1940  GLUCAP 114* 114* 107* 112* 84    Recent Results (from the past 240 hour(s))  URINE CULTURE     Status: Normal   Collection Time   08/02/12  6:43 PM      Component Value Range Status Comment   Specimen Description URINE, CATHETERIZED   Final    Special Requests NONE   Final    Culture  Setup Time 08/03/2012 14:26   Final    Colony Count NO GROWTH   Final    Culture NO GROWTH   Final    Report Status 08/04/2012 FINAL   Final   MRSA PCR SCREENING     Status: Abnormal    Collection Time   08/02/12  9:45 PM      Component Value Range Status Comment   MRSA by PCR POSITIVE (*) NEGATIVE Final      Studies: Ir Pta Venous Right  08/08/2012  *RADIOLOGY REPORT*  Clinical data:  Occluded dialysis graft.  Previous declot and venous anastomotic PTA 11/16/2011.  DIALYSIS GRAFT DECLOT VENOUS ANGIOPLASTY ULTRASOUND GUIDANCE FOR VASCULAR ACCESS X2  Comparison: 11/16/2011  Technique: The procedure, risks (including but not limited to bleeding, infection, organ damage), benefits, and alternatives were explained to the patient.  Questions regarding the procedure were encouraged and answered.  The patient understands and consents to the procedure.  Intravenous Fentanyl and Versed were administered as conscious sedation during continuous cardiorespiratory monitoring by the radiology RN, with a total moderate sedation time of 40 minutes.   The graft just central to the arterial anastomosis was accessed antegrade with a 21-gauge micropuncture needle under real-time ultrasonic guidance after the overlying skin prepped with Betadine, draped in usual sterile fashion, infiltrated locally with 1% lidocaine. Needle exchanged over 018 guidewire for transitional dilator through which 2 mg t-PA was administered. Ultrasound imaging documentation was saved. Through the antegrade dilator, a Bentson wire was advanced to the venous anastomosis. Over this a 33F sheath was placed, through which a 5 [place] Kumpe catheter was advanced for outflow venography. This showed patency of the outflow venous system through the SVC. 3000 units heparin were administered. The Arrow PTD device was used to macerate thrombus in the graft. Injection showed   clearance of thrombus from the graft.   In similar fashion, the more central aspect of the graft was accessed retrograde under ultrasound with a micropuncture needle, exchanged for a transitional dilator.The venous limb dilator was exchanged in similar fashion for a 6 French  vascular sheath. The Kumpe catheter was advanced and used to gain access to the proximal brachial artery.  The Kumpe exchanged for a the Fogarty catheter, used to dislodge the platelet plug  into the   graft, where it was macerated. The Fogarty was used again to remove any residual platelet plug from the arterial anastomosis.  A follow shuntogram was performed, demonstrating good flow through the outflow vein, no extravasation.  Balloon angioplasty of the graft and venous anastomosis was performed using a 7 mm x 4 cm Conquest angioplasty balloon with good response.  Balloon was removed and injection showed patency of the anastomosis, without extravasation or other apparent complication.  Reflux across the arterial anastomosis demonstrate this  to be widely patent, with unremarkable appearance of the visualized native arterial circulation. The catheter and sheaths were then removed and hemostasis achieved with 2-0 Ethilon sutures. Patient tolerated procedure well.  IMPRESSION  1. Technically successful declot of right upper arm synthetic straight      hemodialysis graft.  2. Technically successful balloon angioplasty of venous anastomotic stenosis.  Access management: Remains approachable for percutaneous intervention as needed.   Original Report Authenticated By: D. Andria Rhein, MD    Ir Angio Av Shunt Addl Access  08/08/2012  *RADIOLOGY REPORT*  Clinical data:  Occluded dialysis graft.  Previous declot and venous anastomotic PTA 11/16/2011.  DIALYSIS GRAFT DECLOT VENOUS ANGIOPLASTY ULTRASOUND GUIDANCE FOR VASCULAR ACCESS X2  Comparison: 11/16/2011  Technique: The procedure, risks (including but not limited to bleeding, infection, organ damage), benefits, and alternatives were explained to the patient.  Questions regarding the procedure were encouraged and answered.  The patient understands and consents to the procedure.  Intravenous Fentanyl and Versed were administered as conscious sedation during continuous  cardiorespiratory monitoring by the radiology RN, with a total moderate sedation time of 40 minutes.   The graft just central to the arterial anastomosis was accessed antegrade with a 21-gauge micropuncture needle under real-time ultrasonic guidance after the overlying skin prepped with Betadine, draped in usual sterile fashion, infiltrated locally with 1% lidocaine. Needle exchanged over 018 guidewire for transitional dilator through which 2 mg t-PA was administered. Ultrasound imaging documentation was saved. Through the antegrade dilator, a Bentson wire was advanced to the venous anastomosis. Over this a 71F sheath was placed, through which a 5 [place] Kumpe catheter was advanced for outflow venography. This showed patency of the outflow venous system through the SVC. 3000 units heparin were administered. The Arrow PTD device was used to macerate thrombus in the graft. Injection showed   clearance of thrombus from the graft.   In similar fashion, the more central aspect of the graft was accessed retrograde under ultrasound with a micropuncture needle, exchanged for a transitional dilator.The venous limb dilator was exchanged in similar fashion for a 6 French vascular sheath. The Kumpe catheter was advanced and used to gain access to the proximal brachial artery.  The Kumpe exchanged for a the Fogarty catheter, used to dislodge the platelet plug  into the   graft, where it was macerated. The Fogarty was used again to remove any residual platelet plug from the arterial anastomosis.  A follow shuntogram was performed, demonstrating good flow through the outflow vein, no extravasation.  Balloon angioplasty of the graft and venous anastomosis was performed using a 7 mm x 4 cm Conquest angioplasty balloon with good response.  Balloon was removed and injection showed patency of the anastomosis, without extravasation or other apparent complication.  Reflux across the arterial anastomosis demonstrate this to be widely  patent, with unremarkable appearance of the visualized native arterial circulation. The catheter and sheaths were then removed and hemostasis achieved with 2-0 Ethilon sutures. Patient tolerated procedure well.  IMPRESSION  1. Technically successful declot of right upper arm synthetic straight      hemodialysis graft.  2. Technically successful balloon angioplasty of venous anastomotic stenosis.  Access management: Remains approachable for percutaneous intervention as needed.   Original Report Authenticated By: D. Andria Rhein, MD    Ir US Guide Vasc Access Right  08/08/2012  *RADIOLOGY REPORT*  Clinical data:  Occluded dialysis graft.  Previous declot and venous anastomotic PTA 11/16/2011.  DIALYSIS GRAFT DECLOT VENOUS ANGIOPLASTY ULTRASOUND GUIDANCE  FOR VASCULAR ACCESS X2  Comparison: 11/16/2011  Technique: The procedure, risks (including but not limited to bleeding, infection, organ damage), benefits, and alternatives were explained to the patient.  Questions regarding the procedure were encouraged and answered.  The patient understands and consents to the procedure.  Intravenous Fentanyl and Versed were administered as conscious sedation during continuous cardiorespiratory monitoring by the radiology RN, with a total moderate sedation time of 40 minutes.   The graft just central to the arterial anastomosis was accessed antegrade with a 21-gauge micropuncture needle under real-time ultrasonic guidance after the overlying skin prepped with Betadine, draped in usual sterile fashion, infiltrated locally with 1% lidocaine. Needle exchanged over 018 guidewire for transitional dilator through which 2 mg t-PA was administered. Ultrasound imaging documentation was saved. Through the antegrade dilator, a Bentson wire was advanced to the venous anastomosis. Over this a 33F sheath was placed, through which a 5 [place] Kumpe catheter was advanced for outflow venography. This showed patency of the outflow venous system  through the SVC. 3000 units heparin were administered. The Arrow PTD device was used to macerate thrombus in the graft. Injection showed   clearance of thrombus from the graft.   In similar fashion, the more central aspect of the graft was accessed retrograde under ultrasound with a micropuncture needle, exchanged for a transitional dilator.The venous limb dilator was exchanged in similar fashion for a 6 French vascular sheath. The Kumpe catheter was advanced and used to gain access to the proximal brachial artery.  The Kumpe exchanged for a the Fogarty catheter, used to dislodge the platelet plug  into the   graft, where it was macerated. The Fogarty was used again to remove any residual platelet plug from the arterial anastomosis.  A follow shuntogram was performed, demonstrating good flow through the outflow vein, no extravasation.  Balloon angioplasty of the graft and venous anastomosis was performed using a 7 mm x 4 cm Conquest angioplasty balloon with good response.  Balloon was removed and injection showed patency of the anastomosis, without extravasation or other apparent complication.  Reflux across the arterial anastomosis demonstrate this to be widely patent, with unremarkable appearance of the visualized native arterial circulation. The catheter and sheaths were then removed and hemostasis achieved with 2-0 Ethilon sutures. Patient tolerated procedure well.  IMPRESSION  1. Technically successful declot of right upper arm synthetic straight      hemodialysis graft.  2. Technically successful balloon angioplasty of venous anastomotic stenosis.  Access management: Remains approachable for percutaneous intervention as needed.   Original Report Authenticated By: D. Andria Rhein, MD    Dg Abd Portable 1v  08/09/2012  *RADIOLOGY REPORT*  Clinical Data: Evaluate residual barium prior to G-tube placement  PORTABLE ABDOMEN - 1 VIEW  Comparison: KUB of 08/07/2012  Findings: The retained barium resides exclusively  within the right colon.  An NG tube is present with the tip overlying the distal antrum of the stomach.  The bowel gas pattern is nonspecific.  A moderate amount of feces is present throughout the colon.  The bones are diffusely osteopenic and there is a slight lumbar curvature convex to the left.  IMPRESSION: Retained barium is within the right colon exclusively.  Moderate amount of feces throughout the colon.   Original Report Authenticated By: Dwyane Dee, M.D.    Ir Declot Right Mod Sed  08/08/2012  *RADIOLOGY REPORT*  Clinical data:  Occluded dialysis graft.  Previous declot and venous anastomotic PTA 11/16/2011.  DIALYSIS GRAFT DECLOT VENOUS  ANGIOPLASTY ULTRASOUND GUIDANCE FOR VASCULAR ACCESS X2  Comparison: 11/16/2011  Technique: The procedure, risks (including but not limited to bleeding, infection, organ damage), benefits, and alternatives were explained to the patient.  Questions regarding the procedure were encouraged and answered.  The patient understands and consents to the procedure.  Intravenous Fentanyl and Versed were administered as conscious sedation during continuous cardiorespiratory monitoring by the radiology RN, with a total moderate sedation time of 40 minutes.   The graft just central to the arterial anastomosis was accessed antegrade with a 21-gauge micropuncture needle under real-time ultrasonic guidance after the overlying skin prepped with Betadine, draped in usual sterile fashion, infiltrated locally with 1% lidocaine. Needle exchanged over 018 guidewire for transitional dilator through which 2 mg t-PA was administered. Ultrasound imaging documentation was saved. Through the antegrade dilator, a Bentson wire was advanced to the venous anastomosis. Over this a 3F sheath was placed, through which a 5 [place] Kumpe catheter was advanced for outflow venography. This showed patency of the outflow venous system through the SVC. 3000 units heparin were administered. The Arrow PTD device was  used to macerate thrombus in the graft. Injection showed   clearance of thrombus from the graft.   In similar fashion, the more central aspect of the graft was accessed retrograde under ultrasound with a micropuncture needle, exchanged for a transitional dilator.The venous limb dilator was exchanged in similar fashion for a 6 French vascular sheath. The Kumpe catheter was advanced and used to gain access to the proximal brachial artery.  The Kumpe exchanged for a the Fogarty catheter, used to dislodge the platelet plug  into the   graft, where it was macerated. The Fogarty was used again to remove any residual platelet plug from the arterial anastomosis.  A follow shuntogram was performed, demonstrating good flow through the outflow vein, no extravasation.  Balloon angioplasty of the graft and venous anastomosis was performed using a 7 mm x 4 cm Conquest angioplasty balloon with good response.  Balloon was removed and injection showed patency of the anastomosis, without extravasation or other apparent complication.  Reflux across the arterial anastomosis demonstrate this to be widely patent, with unremarkable appearance of the visualized native arterial circulation. The catheter and sheaths were then removed and hemostasis achieved with 2-0 Ethilon sutures. Patient tolerated procedure well.  IMPRESSION  1. Technically successful declot of right upper arm synthetic straight      hemodialysis graft.  2. Technically successful balloon angioplasty of venous anastomotic stenosis.  Access management: Remains approachable for percutaneous intervention as needed.   Original Report Authenticated By: D. Andria Rhein, MD    Ir Radiologist Eval & Mgmt  08/08/2012  *RADIOLOGY REPORT*  Clinical data:  Occluded dialysis graft.  Previous declot and venous anastomotic PTA 11/16/2011.  DIALYSIS GRAFT DECLOT VENOUS ANGIOPLASTY ULTRASOUND GUIDANCE FOR VASCULAR ACCESS X2  Comparison: 11/16/2011  Technique: The procedure, risks  (including but not limited to bleeding, infection, organ damage), benefits, and alternatives were explained to the patient.  Questions regarding the procedure were encouraged and answered.  The patient understands and consents to the procedure.  Intravenous Fentanyl and Versed were administered as conscious sedation during continuous cardiorespiratory monitoring by the radiology RN, with a total moderate sedation time of 40 minutes.   The graft just central to the arterial anastomosis was accessed antegrade with a 21-gauge micropuncture needle under real-time ultrasonic guidance after the overlying skin prepped with Betadine, draped in usual sterile fashion, infiltrated locally with 1% lidocaine. Needle exchanged over  018 guidewire for transitional dilator through which 2 mg t-PA was administered. Ultrasound imaging documentation was saved. Through the antegrade dilator, a Bentson wire was advanced to the venous anastomosis. Over this a 43F sheath was placed, through which a 5 [place] Kumpe catheter was advanced for outflow venography. This showed patency of the outflow venous system through the SVC. 3000 units heparin were administered. The Arrow PTD device was used to macerate thrombus in the graft. Injection showed   clearance of thrombus from the graft.   In similar fashion, the more central aspect of the graft was accessed retrograde under ultrasound with a micropuncture needle, exchanged for a transitional dilator.The venous limb dilator was exchanged in similar fashion for a 6 French vascular sheath. The Kumpe catheter was advanced and used to gain access to the proximal brachial artery.  The Kumpe exchanged for a the Fogarty catheter, used to dislodge the platelet plug  into the   graft, where it was macerated. The Fogarty was used again to remove any residual platelet plug from the arterial anastomosis.  A follow shuntogram was performed, demonstrating good flow through the outflow vein, no extravasation.   Balloon angioplasty of the graft and venous anastomosis was performed using a 7 mm x 4 cm Conquest angioplasty balloon with good response.  Balloon was removed and injection showed patency of the anastomosis, without extravasation or other apparent complication.  Reflux across the arterial anastomosis demonstrate this to be widely patent, with unremarkable appearance of the visualized native arterial circulation. The catheter and sheaths were then removed and hemostasis achieved with 2-0 Ethilon sutures. Patient tolerated procedure well.  IMPRESSION  1. Technically successful declot of right upper arm synthetic straight      hemodialysis graft.  2. Technically successful balloon angioplasty of venous anastomotic stenosis.  Access management: Remains approachable for percutaneous intervention as needed.   Original Report Authenticated By: D. Andria Rhein, MD     Scheduled Meds:    . alteplase  4 mg Intracatheter Once  . aspirin  300 mg Rectal Daily   Or  . aspirin  325 mg Oral Daily  . darbepoetin (ARANESP) injection - DIALYSIS  100 mcg Intravenous Q Sat-HD  . doxercalciferol  0.5 mcg Intravenous Q T,Th,Sa-HD  . heparin  5,000 Units Subcutaneous Q8H  . sodium chloride  3 mL Intravenous Q12H   Continuous Infusions:    . dextrose 5 % and 0.9% NaCl 999 mL (08/09/12 1413)    Active Problems:  Altered mental status  Syncope  ESRD (end stage renal disease)  Diabetes mellitus, type 2  HTN (hypertension)  Anemia  Sacral decubitus ulcer  FTT (failure to thrive) in adult  CVA (cerebral infarction)    Charell Faulk A, MD  [facility] Pager [phone]. If 7PM-7AM, please contact night-coverage at [email or web address], password [facility] 08/09/2012, 2:21 PM  LOS: 7 days

## 2012-08-10 LAB — GLUCOSE, CAPILLARY
Glucose-Capillary: 102 mg/dL — ABNORMAL HIGH (ref 70–99)
Glucose-Capillary: 109 mg/dL — ABNORMAL HIGH (ref 70–99)
Glucose-Capillary: 115 mg/dL — ABNORMAL HIGH (ref 70–99)

## 2012-08-10 LAB — CBC
Hemoglobin: 10.5 g/dL — ABNORMAL LOW (ref 12.0–15.0)
MCH: 27.8 pg (ref 26.0–34.0)
MCHC: 31.1 g/dL (ref 30.0–36.0)
MCV: 89.4 fL (ref 78.0–100.0)
Platelets: 170 10*3/uL (ref 150–400)
RBC: 3.78 MIL/uL — ABNORMAL LOW (ref 3.87–5.11)

## 2012-08-10 LAB — BASIC METABOLIC PANEL
CO2: 26 mEq/L (ref 19–32)
Calcium: 8.4 mg/dL (ref 8.4–10.5)
Creatinine, Ser: 3.45 mg/dL — ABNORMAL HIGH (ref 0.50–1.10)
Glucose, Bld: 105 mg/dL — ABNORMAL HIGH (ref 70–99)

## 2012-08-10 MED ORDER — NEPRO/CARBSTEADY PO LIQD
474.0000 mL | Freq: Two times a day (BID) | ORAL | Status: DC
  Administered 2012-08-10: 474 mL
  Administered 2012-08-10: 237 mL
  Administered 2012-08-11 – 2012-08-12 (×3): 474 mL
  Administered 2012-08-12: 237 mL
  Administered 2012-08-13 – 2012-08-17 (×9): 474 mL
  Filled 2012-08-10 (×19): qty 474

## 2012-08-10 MED ORDER — DILTIAZEM HCL ER COATED BEADS 120 MG PO CP24: 120.0000 mg | ORAL_CAPSULE | Freq: Every day | ORAL | Status: DC

## 2012-08-10 MED ORDER — COLLAGENASE 250 UNIT/GM EX OINT
TOPICAL_OINTMENT | Freq: Every day | CUTANEOUS | Status: DC
  Administered 2012-08-10 – 2012-08-14 (×4): via TOPICAL
  Administered 2012-08-15: 1 via TOPICAL
  Administered 2012-08-16 – 2012-08-17 (×2): via TOPICAL
  Filled 2012-08-10: qty 30

## 2012-08-10 MED ORDER — FREE WATER
100.0000 mL | Freq: Four times a day (QID) | Status: DC
  Administered 2012-08-10 – 2012-08-17 (×28): 100 mL

## 2012-08-10 MED ORDER — FAMOTIDINE 20 MG PO TABS
20.0000 mg | ORAL_TABLET | Freq: Every day | ORAL | Status: DC
  Administered 2012-08-10 – 2012-08-17 (×8): 20 mg via ORAL
  Filled 2012-08-10 (×8): qty 1

## 2012-08-10 MED ORDER — AMLODIPINE BESYLATE 2.5 MG PO TABS
2.5000 mg | ORAL_TABLET | Freq: Every day | ORAL | Status: DC
  Administered 2012-08-10 – 2012-08-17 (×7): 2.5 mg via ORAL
  Filled 2012-08-10 (×8): qty 1

## 2012-08-10 MED ORDER — POLYSACCHARIDE IRON COMPLEX 150 MG PO CAPS
150.0000 mg | ORAL_CAPSULE | Freq: Every day | ORAL | Status: DC
  Administered 2012-08-10 – 2012-08-17 (×8): 150 mg via ORAL
  Filled 2012-08-10 (×8): qty 1

## 2012-08-10 MED ORDER — SIMVASTATIN 10 MG PO TABS
10.0000 mg | ORAL_TABLET | Freq: Every day | ORAL | Status: DC
Start: 2012-08-10 — End: 2012-08-17
  Administered 2012-08-10 – 2012-08-16 (×5): 10 mg via ORAL
  Filled 2012-08-10 (×8): qty 1

## 2012-08-10 MED ORDER — MIRTAZAPINE 15 MG PO TABS
15.0000 mg | ORAL_TABLET | Freq: Every day | ORAL | Status: DC
  Administered 2012-08-10 – 2012-08-16 (×7): 15 mg via ORAL
  Filled 2012-08-10 (×8): qty 1

## 2012-08-10 MED ORDER — CLONIDINE HCL 0.2 MG/24HR TD PTWK
0.2000 mg | MEDICATED_PATCH | TRANSDERMAL | Status: DC
  Administered 2012-08-10 – 2012-08-17 (×2): 0.2 mg via TRANSDERMAL
  Filled 2012-08-10 (×2): qty 1

## 2012-08-10 MED ORDER — PAROXETINE HCL 20 MG PO TABS
20.0000 mg | ORAL_TABLET | Freq: Every morning | ORAL | Status: DC
  Administered 2012-08-10 – 2012-08-17 (×8): 20 mg via ORAL
  Filled 2012-08-10 (×8): qty 1

## 2012-08-10 NOTE — Progress Notes (Signed)
S: "Get your hands off me" O:BP 216/101  Pulse 86  Temp 98.2 F (36.8 C) (Oral)  Resp 18  Ht 5\' 2"  (1.575 m)  Wt 43.4 kg (95 lb 10.9 oz)  BMI 17.50 kg/m2  SpO2 98%  Intake/Output Summary (Last 24 hours) at 08/10/12 1214 Last data filed at 08/10/12 0000  Gross per 24 hour  Intake     10 ml  Output    250 ml  Net   -240 ml   Weight change:  ZOX:WRUEA and alert CVS:RRR Resp:clear ant Abd:+ BS NTND  PEG tube in place Ext:Bil BKA, Rt AVG + bruit.  Both arms flexed NEURO: Awake and alert but somewhat delerious    . alteplase  4 mg Intracatheter Once  . amLODipine  2.5 mg Oral Daily  . aspirin  300 mg Rectal Daily   Or  . aspirin  325 mg Oral Daily  . cloNIDine  0.2 mg Transdermal Weekly  . collagenase   Topical Daily  . darbepoetin (ARANESP) injection - DIALYSIS  100 mcg Intravenous Q Sat-HD  . doxercalciferol  0.5 mcg Intravenous Q T,Th,Sa-HD  . famotidine  20 mg Oral Daily  . feeding supplement (NEPRO CARB STEADY)  474 mL Per Tube BID  . free water  100 mL Per Tube Q6H  . heparin  5,000 Units Subcutaneous Q8H  . iron polysaccharides  150 mg Oral Daily  . mirtazapine  15 mg Oral QHS  . PARoxetine  20 mg Oral q morning - 10a  . simvastatin  10 mg Oral q1800  . sodium chloride  3 mL Intravenous Q12H   Ir Gastrostomy Tube Mod Sed  08/09/2012  *RADIOLOGY REPORT*  PULL THROUGH GASTROSTOMY TUBE PLACEMENT UNDER FLUOROSCOPIC GUIDANCE  Date: August 09, 2012  Clinical History: 79 year old female with a history of stroke, altered mental status, failure to thrive and protein calorie malnutrition.  A percutaneous gastrostomy tube is required for parenteral nutrition.  Procedures Performed:  1.  Fluoroscopically guided placement of percutaneous pull-through gastrostomy tube.  Interventional Radiologist:  Sterling Big, MD  Sedation: Moderate (conscious) sedation was used.  1.5 mg Versed, 75 mcg Fentanyl were administered intravenously.  The patient's vital signs were monitored  continuously by radiology nursing throughout the procedure.  Sedation Time: 24 minutes  Fluoroscopy time: 2.4 minutes  Contrast volume: 20 ml Omnipaque-300 administered into the GI tract  Antibiotics:  1g Ancef was administered intravenously within 1 hour of skin incision.  PROCEDURE/FINDINGS:   Informed consent was obtained from the patient following explanation of the procedure, risks, benefits and alternatives. The patient understands, agrees and consents for the procedure. All questions were addressed. A time out was performed.  Maximal barrier sterile technique utilized including caps, mask, sterile gowns, sterile gloves, large sterile drape, hand hygiene, and chlorhexadine skin prep.  An angled catheter was advanced over a wire under fluoroscopic guidance through the nose, down the esophagus and into the body of the stomach.  The stomach was then insufflated with several 100 ml of air.  Fluoroscopy confirmed location of the gastric bubble, as well as inferior displacement of the barium stained colon.  Under direct fluoroscopic guidance, a single T-tack was placed, and the anterior gastric wall drawn up against the anterior abdominal wall. Percutaneous access was then obtained into the mid gastric body with an 18 gauge sheath needle.  Aspiration of air, and injection of contrast material under fluoroscopy confirmed needle placement.  An Amplatz wire was advanced in the gastric body and  the access needle exchanged for a 9-French vascular sheath.  A snare device was advanced through the vascular sheath and an Amplatz wire advanced through the angled catheter.  The Amplatz wire was successfully snared and this was pulled up through the esophagus and out the mouth.  A 20-French Burnell Blanks MIC-PEG tube was then connected to the snare and pulled through the mouth, down the esophagus, into the stomach and out to the anterior abdominal wall. Hand injection of contrast material confirmed intragastric location. The  T-tack retention suture was then cut. The pull through peg tube was then secured with the external bumper and capped.  The patient will be observed for several hours with the newly placed tube on low wall suction to evaluate for any post procedure complication.  The patient tolerated the procedure well, there is no immediate complication.  IMPRESSION:  Successful placement of a 20 French pull through gastrostomy tube.  The tube will be ready for use at noon on Friday gender 20 11/04/2012.  Signed,  Sterling Big, MD Vascular & Interventional Radiologist [facility]   Original Report Authenticated By: Malachy Moan, M.D.    Dg Abd Portable 1v  08/09/2012  *RADIOLOGY REPORT*  Clinical Data: Evaluate residual barium prior to G-tube placement  PORTABLE ABDOMEN - 1 VIEW  Comparison: KUB of 08/07/2012  Findings: The retained barium resides exclusively within the right colon.  An NG tube is present with the tip overlying the distal antrum of the stomach.  The bowel gas pattern is nonspecific.  A moderate amount of feces is present throughout the colon.  The bones are diffusely osteopenic and there is a slight lumbar curvature convex to the left.  IMPRESSION: Retained barium is within the right colon exclusively.  Moderate amount of feces throughout the colon.   Original Report Authenticated By: Dwyane Dee, M.D.    BMET    Component Value Date/Time   NA 144 08/10/2012 0610   K 4.3 08/10/2012 0610   CL 104 08/10/2012 0610   CO2 26 08/10/2012 0610   GLUCOSE 105* 08/10/2012 0610   BUN 21 08/10/2012 0610   CREATININE 3.45* 08/10/2012 0610   CALCIUM 8.4 08/10/2012 0610   GFRNONAA 12* 08/10/2012 0610   GFRAA 14* 08/10/2012 0610   CBC    Component Value Date/Time   WBC 11.0* 08/10/2012 0610   RBC 3.78* 08/10/2012 0610   HGB 10.5* 08/10/2012 0610   HCT 33.8* 08/10/2012 0610   PLT 170 08/10/2012 0610   MCV 89.4 08/10/2012 0610   MCH 27.8 08/10/2012 0610   MCHC 31.1 08/10/2012 0610   RDW 16.0* 08/10/2012  0610   LYMPHSABS 1.5 08/02/2012 1651   MONOABS 0.7 08/02/2012 1651   EOSABS 0.0 08/02/2012 1651   BASOSABS 0.0 08/02/2012 1651     Assessment: 1. Acute stroke 2. Anemia  SP transfusion  3. Sec HPTH 4. ESRD 5. Clotted access  SP Declot  Plan: 1. I spoke to her dialysis unit and pt has been essentially non ambulatory prior to this event and was transported to the dialysis unit from the [facility] in a [place] chair.  So if pt can get into a [place] chair then they should be able to handle her as an outpt. 2. Plan HD in AM and try to do in a chair 3. Palliative care to see pt Zacary Bauer T

## 2012-08-10 NOTE — Progress Notes (Signed)
Subjective: Denies pain at gastric tube site.  Seems agitated - saying over and over wanting to go home.   Objective: Vital signs in last 24 hours: Temp:  [97.8 F (36.6 C)-99.6 F (37.6 C)] 98.2 F (36.8 C) (01/24 1030) Pulse Rate:  [82-121] 86  (01/24 1030) Resp:  [12-34] 18  (01/24 1030) BP: (131-216)/(60-101) 216/101 mmHg (01/24 1030) SpO2:  [96 %-100 %] 98 % (01/24 1030) Last BM Date: 08/08/12  Intake/Output from previous day: 01/23 0701 - 01/24 0700 In: 10  Out: 250 [Urine:250] Intake/Output this shift:    PE:  Awake, agitated. Denies pain. Gastric tube in tact with insertion site clean and dry. Denies tenderness. Positive bowel sounds throughout - no distention noted.   Lab Results:   [facility] 08/10/12 0610 08/09/12 0855  WBC 11.0* 13.1*  HGB 10.5* 11.2*  HCT 33.8* 35.5*  PLT 170 191   BMET  Basename 08/10/12 0610 08/09/12 0855  NA 144 144  K 4.3 4.3  CL 104 102  CO2 26 27  GLUCOSE 105* 118*  BUN 21 13  CREATININE 3.45* 2.41*  CALCIUM 8.4 8.5     Studies/Results: Ir Gastrostomy Tube Mod Sed  08/09/2012  *RADIOLOGY REPORT*  PULL THROUGH GASTROSTOMY TUBE PLACEMENT UNDER FLUOROSCOPIC GUIDANCE  Date: August 09, 2012  Clinical History: 79 year old female with a history of stroke, altered mental status, failure to thrive and protein calorie malnutrition.  A percutaneous gastrostomy tube is required for parenteral nutrition.  Procedures Performed:  1.  Fluoroscopically guided placement of percutaneous pull-through gastrostomy tube.  Interventional Radiologist:  Linda Big, MD  Sedation: Moderate (conscious) sedation was used.  1.5 mg Versed, 75 mcg Fentanyl were administered intravenously.  The patient's vital signs were monitored continuously by radiology nursing throughout the procedure.  Sedation Time: 24 minutes  Fluoroscopy time: 2.4 minutes  Contrast volume: 20 ml Omnipaque-300 administered into the GI tract  Antibiotics:  1g Ancef was administered  intravenously within 1 hour of skin incision.  PROCEDURE/FINDINGS:   Informed consent was obtained from the patient following explanation of the procedure, risks, benefits and alternatives. The patient understands, agrees and consents for the procedure. All questions were addressed. A time out was performed.  Maximal barrier sterile technique utilized including caps, mask, sterile gowns, sterile gloves, large sterile drape, hand hygiene, and chlorhexadine skin prep.  An angled catheter was advanced over a wire under fluoroscopic guidance through the nose, down the esophagus and into the body of the stomach.  The stomach was then insufflated with several 100 ml of air.  Fluoroscopy confirmed location of the gastric bubble, as well as inferior displacement of the barium stained colon.  Under direct fluoroscopic guidance, a single T-tack was placed, and the anterior gastric wall drawn up against the anterior abdominal wall. Percutaneous access was then obtained into the mid gastric body with an 18 gauge sheath needle.  Aspiration of air, and injection of contrast material under fluoroscopy confirmed needle placement.  An Amplatz wire was advanced in the gastric body and the access needle exchanged for a 9-French vascular sheath.  A snare device was advanced through the vascular sheath and an Amplatz wire advanced through the angled catheter.  The Amplatz wire was successfully snared and this was pulled up through the esophagus and out the mouth.  A 20-French Burnell Blanks MIC-PEG tube was then connected to the snare and pulled through the mouth, down the esophagus, into the stomach and out to the anterior abdominal wall. Hand injection of  contrast material confirmed intragastric location. The T-tack retention suture was then cut. The pull through peg tube was then secured with the external bumper and capped.  The patient will be observed for several hours with the newly placed tube on low wall suction to evaluate for  any post procedure complication.  The patient tolerated the procedure well, there is no immediate complication.  IMPRESSION:  Successful placement of a 20 French pull through gastrostomy tube.  The tube will be ready for use at noon on Friday gender 20 11/04/2012.  Signed,  Linda Big, MD Vascular & Interventional Radiologist [facility]   Original Report Authenticated By: Linda [name], M.[name].    Dg Abd Portable 1v  08/09/2012  *RADIOLOGY REPORT*  Clinical Data: Evaluate residual barium prior to G-tube placement  PORTABLE ABDOMEN - 1 VIEW  Comparison: KUB of 08/07/2012  Findings: The retained barium resides exclusively within the right colon.  An NG tube is present with the tip overlying the distal antrum of the stomach.  The bowel gas pattern is nonspecific.  A moderate amount of feces is present throughout the colon.  The bones are diffusely osteopenic and there is a slight lumbar curvature convex to the left.  IMPRESSION: Retained barium is within the right colon exclusively.  Moderate amount of feces throughout the colon.   Original Report Authenticated By: Linda [name], M.[name].     Anti-infectives: Anti-infectives     Start     Dose/Rate Route Frequency Ordered Stop   08/09/12 0600   ceFAZolin (ANCEF) IVPB 1 g/50 mL premix     Comments: Hang ON CALL to xray 1/23 am      1 g 100 mL/hr over 30 Minutes Intravenous  Once 08/08/12 1100 08/09/12 0740   08/07/12 0000   ceFAZolin (ANCEF) IVPB 1 g/50 mL premix     Comments: Hang ON CALL to xray for G tube 1/21      1 g 100 mL/hr over 30 Minutes Intravenous  Once 08/06/12 1117 08/09/12 1402          Assessment/Plan: Post gastric tube placement day #2 - site looks great - ready for all po needs.     LOS: 8 days    Linda [name] 08/10/2012

## 2012-08-10 NOTE — Progress Notes (Signed)
[facility] PROGRESS NOTE  Linda [name] WGN:[id number] DOB: August 09, 1930 DOA: 08/02/2012 PCP: Colon Branch, MD Brief Narrative: This is a 79 year old female nursing facility resident, with known history of ESRD on HD T-T-S, chronic anemia, DM, dyslipidemia, secondary hyperparathyroidism, HTN, PVD, s/p bilateral BKA, GERD, sacral decubitus s/p recent debridement,s/p recent hospitalization at [facility], for pyelonephritis, treated with Tobramycin, then discharged on Vancomycin with HD. Apparently, patient is s/p HD on 08/02/12, which she completed without incident, then en route to the nursing facility, she had an episode of unresponsiveness, associated with facial drooping, lasting for about 20 mins, before patient arrived at the nursing facility. She was then brought to the ED. Per ED MD, patient on arrival, was asymptomatic and at baseline mental status. She was admitted for further evaluation and management.   Assessment/Plan: Altered mental status/CVA Patient presented with what was described as an episode of very poor responsiveness, associated with facial droop, following HD. Clinically, she does have some weakness of left upper extremity. Head CT scan revealed an infarct within the right parietal lobe which may be subacute to chronic. Patient reportedly, was last seen normal at 2:15 PM on 08/02/12, so she was outside the window for t-PA, and symptoms had improved on arrival, although her other co-morbidities, including a moderate anemia, may also have been contraindications. Dr Wyatt Portela provided neurology consultation and CVA w/u, carried out. Brain MRI showed subcentimeter left frontal subcortical white matter infarct, approximately 5 mm in diameter. No hemorrhage. Advanced atrophy and extensive chronic microvascular ischemic change. Remote ischemic events are stable. MRA revealed potentially flow reducing lesion supraclinoid internal carotid artery on the right, estimated  50-75% stenosis. Widespread intracranial atherosclerotic disease elsewhere. 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 Carotid doppler showed no evidence of hemodynamically significant internal carotid artery stenosis. Vertebral artery flow is antegrade. 2D Echocardiogram revealed normal LV cavity size, mild LVH and EF of 65% to 70%. Continued on anti-platelet medication. EKG shows SR, with old Q-waves in V1-V2, but no acute ischemic changes. SLP has evaluated and recommended Dysphagia 1 with pudding thick consistency.  Due to failure to thrive, PEG was placed today for nutrition.  ESRD (end stage renal disease) Patient is on HD T-T-S, and is s/p hemodialysis on 08/02/12. Dr Delano Metz provided nephrology consultation and regular dialysis schedule has been reinstated. Patient was found on 08/07/12, patient to have a clotted AV-fistula and had declotting performed on 08/08/2012.  Discussed with Dr. Briant Cedar due to patient's decubitus ulcer may have difficulty sitting for outpatient dialysis. Discussed with patient's daughter about palliative care and goals of care at SNF, patient's daughter is agreeable.   Diabetes mellitus, type 2 Patient has insulin-requiring type 2 diabetes mellitus. CBG was 66 on presentation, but came up to 80, while in the ED. As patient is clearly at high risk of hypoglycemia, given ESRD, insulin therapy and NPO status, she has been placed on dextrose infusion, albeit at low rate of 40cc/hr, to avoid fluid overload. All insulin is on hold, and CBGs have remained satisfactory.   HTN (hypertension) Not well controlled. Restarted on some antihypertensive medications at lower dose and titrate up as tolerated. Will avoid aggressive BP control.   Anemia Normocytic and due to chronic disease and ESRD. Following CBC, but HB is gradually drifting down. Patient transfused 2 units of pRBC on 08/08/2012.  Sacral  decubitus ulcer Patient has a rather large, approximately 8 cm x 9 cm stage  4 sacral decubitus, and is reportedly, s/p recent debridement. Wound shows evidence of granulation, and does not appear overly infected. Utilizing mattress overlay. Patient has been evaluated by [facility], has undergone further sharp debridement on 08/04/12, as well as hydrotherapy. Managing as recommended.   Recent pyelonephritis Patient is reportedly, s/p recent hospitalization at [facility], for a pyelonephritis, for which she was treated with Tobramycin, then Vancomycin. She is afebrile, and does not look toxic. Repeat urinalysis revealed pyuria, but culture showed no growth.  Failure to thrive  It appears that patient has had FTT and poor oral intake, and that the question of possible PEG was discussed at SNF. Have discussed with daughter on 08/04/12, and after conferring with family, they have elected to proceed. PEG placed on 08/09/2012 to start tube feeds on 08/10/2012.    Code Status: Full Code.  Family Communication: Discussed with patient's daughter [name] [phone].  Disposition Plan: Pending meeting with palliative care to discuss goals of care.    Consultants:  Dr. Leroy Kennedy, neurology  Dr. Schertz/Dr. Briant Cedar, renal  Procedures:  Head CT  CXR  Antibiotics:  None.  HPI/Subjective: Asked me to get out.  Objective: Filed Vitals:   08/09/12 2113 08/10/12 0127 08/10/12 0438 08/10/12 1030  BP: 160/87 188/92 184/74 216/101  Pulse: 99 99 95 86  Temp: 98.1 F (36.7 C) 97.8 F (36.6 C) 99.6 F (37.6 C) 98.2 F (36.8 C)  TempSrc: Axillary Axillary Axillary Oral  Resp: 18 16 18 18   Height:      Weight:      SpO2: 100% 98% 100% 98%    Intake/Output Summary (Last 24 hours) at 08/10/12 1224 Last data filed at 08/10/12 0000  Gross per 24 hour  Intake     10 ml  Output    250 ml  Net   -240 ml   Filed Weights   08/07/12 0645 08/07/12 0800 08/08/12 1334  Weight: 45.4 kg (100 lb 1.4  oz) 45.4 kg (100 lb 1.4 oz) 43.4 kg (95 lb 10.9 oz)    Exam: Physical Exam: Was not examined as patient asked me to leave the room.  Data Reviewed: Basic Metabolic Panel:  Lab 08/10/12 0981 08/09/12 1914 08/08/12 7829 08/07/12 5621 08/06/12 0652 08/05/12 0640 08/04/12 0550  NA 144 144 144 142 143 -- --  K 4.3 4.3 3.9 3.6 3.5 -- --  CL 104 102 105 101 104 -- --  CO2 26 27 28 27 26  -- --  GLUCOSE 105* 118* 203* 149* 151* -- --  BUN 21 13 27* 30* 25* -- --  CREATININE 3.45* 2.41* 4.67* 5.34* 4.58* -- --  CALCIUM 8.4 8.5 8.4 8.3* 8.2* -- --  MG -- -- -- -- -- -- --  PHOS -- -- 4.4 4.6 4.5 3.7 4.9*   Liver Function Tests:  Lab 08/08/12 3086 08/07/12 0712 08/06/12 0652 08/05/12 0640 08/04/12 0550  AST -- -- -- -- --  ALT -- -- -- -- --  ALKPHOS -- -- -- -- --  BILITOT -- -- -- -- --  PROT -- -- -- -- --  ALBUMIN 2.1* 2.1* 2.0* 2.1* 1.9*   No results found for this basename: LIPASE:5,AMYLASE:5 in the last 168 hours No results found for this basename: AMMONIA:5 in the last 168 hours CBC:  Lab 08/10/12 0610 08/09/12 0855 08/08/12 0633 08/07/12 0824 08/06/12 0652  WBC 11.0* 13.1* 7.2 8.9 6.6  NEUTROABS -- -- -- -- --  HGB 10.5* 11.2* 7.1* 7.7* 7.6*  HCT 33.8*  35.5* 23.7* 24.8* 24.8*  MCV 89.4 88.5 87.8 86.1 86.7  PLT 170 191 241 267 266   Cardiac Enzymes: No results found for this basename: CKTOTAL:5,CKMB:5,CKMBINDEX:5,TROPONINI:5 in the last 168 hours BNP (last 3 results)  Basename 09/14/11 1223  PROBNP 8852.0*   CBG:  Lab 08/10/12 0812 08/10/12 0441 08/10/12 0039 08/09/12 2041 08/09/12 1556  GLUCAP 109* 106* 102* 116* 135*    Recent Results (from the past 240 hour(s))  URINE CULTURE     Status: Normal   Collection Time   08/02/12  6:43 PM      Component Value Range Status Comment   Specimen Description URINE, CATHETERIZED   Final    Special Requests NONE   Final    Culture  Setup Time 08/03/2012 14:26   Final    Colony Count NO GROWTH   Final    Culture NO  GROWTH   Final    Report Status 08/04/2012 FINAL   Final   MRSA PCR SCREENING     Status: Abnormal   Collection Time   08/02/12  9:45 PM      Component Value Range Status Comment   MRSA by PCR POSITIVE (*) NEGATIVE Final      Studies: Ir Gastrostomy Tube Mod Sed  08/09/2012  *RADIOLOGY REPORT*  PULL THROUGH GASTROSTOMY TUBE PLACEMENT UNDER FLUOROSCOPIC GUIDANCE  Date: August 09, 2012  Clinical History: 79 year old female with a history of stroke, altered mental status, failure to thrive and protein calorie malnutrition.  A percutaneous gastrostomy tube is required for parenteral nutrition.  Procedures Performed:  1.  Fluoroscopically guided placement of percutaneous pull-through gastrostomy tube.  Interventional Radiologist:  Sterling Big, MD  Sedation: Moderate (conscious) sedation was used.  1.5 mg Versed, 75 mcg Fentanyl were administered intravenously.  The patient's vital signs were monitored continuously by radiology nursing throughout the procedure.  Sedation Time: 24 minutes  Fluoroscopy time: 2.4 minutes  Contrast volume: 20 ml Omnipaque-300 administered into the GI tract  Antibiotics:  1g Ancef was administered intravenously within 1 hour of skin incision.  PROCEDURE/FINDINGS:   Informed consent was obtained from the patient following explanation of the procedure, risks, benefits and alternatives. The patient understands, agrees and consents for the procedure. All questions were addressed. A time out was performed.  Maximal barrier sterile technique utilized including caps, mask, sterile gowns, sterile gloves, large sterile drape, hand hygiene, and chlorhexadine skin prep.  An angled catheter was advanced over a wire under fluoroscopic guidance through the nose, down the esophagus and into the body of the stomach.  The stomach was then insufflated with several 100 ml of air.  Fluoroscopy confirmed location of the gastric bubble, as well as inferior displacement of the barium stained  colon.  Under direct fluoroscopic guidance, a single T-tack was placed, and the anterior gastric wall drawn up against the anterior abdominal wall. Percutaneous access was then obtained into the mid gastric body with an 18 gauge sheath needle.  Aspiration of air, and injection of contrast material under fluoroscopy confirmed needle placement.  An Amplatz wire was advanced in the gastric body and the access needle exchanged for a 9-French vascular sheath.  A snare device was advanced through the vascular sheath and an Amplatz wire advanced through the angled catheter.  The Amplatz wire was successfully snared and this was pulled up through the esophagus and out the mouth.  A 20-French Burnell Blanks MIC-PEG tube was then connected to the snare and pulled through the mouth, down the  esophagus, into the stomach and out to the anterior abdominal wall. Hand injection of contrast material confirmed intragastric location. The T-tack retention suture was then cut. The pull through peg tube was then secured with the external bumper and capped.  The patient will be observed for several hours with the newly placed tube on low wall suction to evaluate for any post procedure complication.  The patient tolerated the procedure well, there is no immediate complication.  IMPRESSION:  Successful placement of a 20 French pull through gastrostomy tube.  The tube will be ready for use at noon on Friday gender 20 11/04/2012.  Signed,  Sterling Big, MD Vascular & Interventional Radiologist [facility] Radiology   Original Report Authenticated By: Malachy Moan, M.D.    Dg Abd Portable 1v  08/09/2012  *RADIOLOGY REPORT*  Clinical Data: Evaluate residual barium prior to G-tube placement  PORTABLE ABDOMEN - 1 VIEW  Comparison: KUB of 08/07/2012  Findings: The retained barium resides exclusively within the right colon.  An NG tube is present with the tip overlying the distal antrum of the stomach.  The bowel gas pattern is  nonspecific.  A moderate amount of feces is present throughout the colon.  The bones are diffusely osteopenic and there is a slight lumbar curvature convex to the left.  IMPRESSION: Retained barium is within the right colon exclusively.  Moderate amount of feces throughout the colon.   Original Report Authenticated By: Dwyane Dee, M.D.     Scheduled Meds:    . alteplase  4 mg Intracatheter Once  . amLODipine  2.5 mg Oral Daily  . aspirin  300 mg Rectal Daily   Or  . aspirin  325 mg Oral Daily  . cloNIDine  0.2 mg Transdermal Weekly  . collagenase   Topical Daily  . darbepoetin (ARANESP) injection - DIALYSIS  100 mcg Intravenous Q Sat-HD  . doxercalciferol  0.5 mcg Intravenous Q T,Th,Sa-HD  . famotidine  20 mg Oral Daily  . feeding supplement (NEPRO CARB STEADY)  474 mL Per Tube BID  . free water  100 mL Per Tube Q6H  . heparin  5,000 Units Subcutaneous Q8H  . iron polysaccharides  150 mg Oral Daily  . mirtazapine  15 mg Oral QHS  . PARoxetine  20 mg Oral q morning - 10a  . simvastatin  10 mg Oral q1800  . sodium chloride  3 mL Intravenous Q12H   Continuous Infusions:    . dextrose 5 % and 0.9% NaCl 999 mL (08/09/12 1413)    Active Problems:  Altered mental status  Syncope  ESRD (end stage renal disease)  Diabetes mellitus, type 2  HTN (hypertension)  Anemia  Sacral decubitus ulcer  FTT (failure to thrive) in adult  CVA (cerebral infarction)    Rey Dansby A, MD  [facility] Pager [phone]. If 7PM-7AM, please contact night-coverage at [email or web address], password [facility] 08/10/2012, 12:24 PM  LOS: 8 days

## 2012-08-10 NOTE — Consult Note (Addendum)
WOC follow up Wound type: Stage IV pressure ulcer sacrum Pressure Ulcer POA: Yes Measurement:measurements essentially unchanged Wound bed:80% early granulation tissue, 20% yellow slough that is largely adherent, unable to be removed with mechanical debridement of hydrotherapy. Wound edges open Drainage (amount, consistency, odor) moderate; serosanguinous  Periwound: intact Dressing procedure/placement/frequency: will decrease hydrotherapy to M/W/F, and add enzymatic debridement to the adherent slough. Case conference with PT/hydro at bedside on this.  Orders updated. Air mattress in place   WOC will follow along for wound progression. Thanya Cegielski [place] RN,CWOCN [phone]

## 2012-08-10 NOTE — Progress Notes (Signed)
Speech Language Pathology Dysphagia Treatment Patient Details Name: Linda [name] MRN: [id number] DOB: 1931/03/25 Today's Date: 08/10/2012 Time: 1340-1400 SLP Time Calculation (min): 20 min  Assessment / Plan / Recommendation Clinical Impression  Pt is now s/p PEG placement (as of 08/09/12). Per RN, tube feeds to begin today. Pt refused trials of po at this time, and continues to be NPO for nutrition and hydration. Plan for Palliative Care meeting to establish goals of care noted. Per RN, GOC meeting should be this weekend. SLP to recheck Monday after GOC established, and proceed accordingly.  RN in agreement.    Diet Recommendation  Continue with Current Diet: NPO;Other (comment) (PEG tube feedings)    SLP Plan Other (Comment) (SLP follow up after GOC established)   Pertinent Vitals/Pain Pain 4/10. RN aware   Swallowing Goals  SLP Swallowing Goals Patient will consume recommended diet without observed clinical signs of aspiration with: Total assistance Swallow Study Goal #1 - Progress: Not Met Patient will utilize recommended strategies during swallow to increase swallowing safety with: Total assistance Swallow Study Goal #2 - Progress: Not met  General Temperature Spikes Noted: No Respiratory Status: Room air Behavior/Cognition: Alert;Confused;Agitated;Requires cueing;Distractible Oral Cavity - Dentition: Edentulous Patient Positioning: Partially reclined  Oral Cavity - Oral Hygiene Does patient have any of the following "at risk" factors?: Other - dysphagia Brush patient's teeth BID with toothbrush (using toothpaste with fluoride): Yes Patient is AT RISK - Oral Care Protocol followed (see row info): Yes   Dysphagia Treatment Treatment focused on: Upgraded PO texture trials Treatment Methods/Modalities: Differential diagnosis;Skilled observation Patient observed directly with PO's: No Reason PO's not observed: Refused   Celia B. [facility], [facility], CCC-SLP [phone]  Leigh Aurora 08/10/2012, 2:10 PM

## 2012-08-10 NOTE — Progress Notes (Signed)
Patient's blood sugar at 2000 is 179. MD on call notified as there is no insulin coverage. Order to monitor as patient tends to run on the low side. Will continue to monitor.

## 2012-08-10 NOTE — Progress Notes (Signed)
Palliative Medicine Team consult for goals of care discussion requested by Dr Betti Cruz attempted to contact patient's daughter Mora Bellman at numbers listed in chart (h: [phone]; [phone]) -message left await call back to set meeting time -will continue to try to reach family   Valente David, RN 08/10/2012, 12:30 PM Palliative Medicine Team RN Liaison [phone]

## 2012-08-10 NOTE — Progress Notes (Signed)
PT HYDROTHERAPY RE-EVALUATION NOTE   08/10/12 1600  Subjective Assessment  Subjective Pt perseverating on the phrase "damn hands"  Date of Onset (prior to admission)  Evaluation and Treatment  Evaluation and Treatment Procedures Explained to Patient/Family Yes  Evaluation and Treatment Procedures Patient unable to consent due to mental status  Wound 08/02/12 Other (Comment) Back Mid;Lower  Date First Assessed/Time First Assessed: 08/02/12 1820   Wound Type: (c) Other (Comment)  Location: Back  Location Orientation: Mid;Lower  Present on Admission: Yes  Site / Wound Assessment Pink;Red;Yellow  % Wound base Red or Granulating 70%  % Wound base Yellow 20%  % Wound base Black 5% (less than)  % Wound base Other (Comment) 5% (greater than)  Peri-wound Assessment Pink;Erythema (blanchable)  Wound Length (cm) 13 cm  Wound Width (cm) 12 cm  Wound Depth (cm) 4 cm  Margins Unattacted edges (unapproximated)  Closure None  Drainage Amount Moderate  Drainage Description Serosanguineous  Non-staged Wound Description Full thickness  Treatment Hydrotherapy (Pulse lavage);Packing (Saline gauze);Tape changed  Dressing Type ABD;Other (Comment);Barrier Film (skin prep);Gauze (Comment)  Dressing Changed Changed  Dressing Status Dry;Intact  Hydrotherapy  Pulsed Lavage with Suction (psi) 4 psi  Pulsed Lavage with Suction - Normal Saline Used 1000 mL  Pulsed Lavage Tip Tip with splash shield  Pulsed lavage therapy - wound location Sacral wound  Selective Debridement  Selective Debridement - Location sacral wound  Selective Debridement - Tools Used Forceps;Scalpel;Scissors  Selective Debridement - Tissue Removed larger area of black removed at 9 oclock  Wound Therapy - Assess/Plan/Recommendations  Wound Therapy - Clinical Statement Wound bed continues to present with increased red beefy granulation, and less necrotic tissue. Will continue hydrotherapy to promote wound healing and increase viable  tissue. Will perform reassessment next visit.    Wound Therapy - Functional Problem List decreased skin integrity, decreased functional mobility  Factors Delaying/Impairing Wound Healing Altered sensation;Diabetes Mellitus;Multiple medical problems;Vascular compromise;Infection - systemic/local  Hydrotherapy Plan Debridement;Patient/family education;Pulsatile lavage with suction  Wound Therapy - Frequency 6X / week  Wound Plan Will change to 3x wk as indicated in new orders  Wound Therapy Goals - Improve the function of patient's integumentary system by progressing the wound(s) through the phases of wound healing by:  Decrease Necrotic Tissue to <10%  Decrease Necrotic Tissue - Progress Goal set today  Increase Granulation Tissue to >90%  Increase Granulation Tissue - Progress Goal set today  Improve Drainage Characteristics Min  Improve Drainage Characteristics - Progress Goal set today  Time For Goal Achievement 7 days  Wound Therapy - Potential for Goals Fair     Charlotte Crumb, PT DPT  [phone]

## 2012-08-10 NOTE — Progress Notes (Addendum)
ADDENDUM: Call back from patient's daughter Linda [name] who requests time change for meeting- she informed her brother/patient's son Linda [name] is POA and he and his wife Linda [name] will be available tomorrow Saturday 1/25 @ 9:00 am and they will call her to be available by phone - per family equest meeting time confirmed for Saturday 1/25 @ 9:00 am  Valente David, RN [phone]  Follow up- call back received from patient's daughter Linda [name] she stated family will be coming to hospital later today - meeting time confirmed for first available provider time - per daughter request meeting scheduled for tomorrow, Saturday 1/25 @ 8:00 am  Valente David, RN 08/10/2012, 3:12 PM Palliative Medicine Team RN Liaison [phone]

## 2012-08-10 NOTE — Clinical Social Work Note (Signed)
Clinical Social Work   CSW is continuing to follow for discharge planning. Palliative Care Team has been consulted. CSW updated [employer]. CSW will continue to follow for discharge needs.   Dede Query, MSW, Theresia Majors [phone]

## 2012-08-11 DIAGNOSIS — R52 Pain, unspecified: Secondary | ICD-10-CM

## 2012-08-11 DIAGNOSIS — R627 Adult failure to thrive: Secondary | ICD-10-CM

## 2012-08-11 LAB — CBC
MCH: 27.6 pg (ref 26.0–34.0)
MCHC: 30.5 g/dL (ref 30.0–36.0)
MCV: 90.3 fL (ref 78.0–100.0)
Platelets: 182 10*3/uL (ref 150–400)
RBC: 3.92 MIL/uL (ref 3.87–5.11)

## 2012-08-11 LAB — BASIC METABOLIC PANEL
CO2: 26 mEq/L (ref 19–32)
Calcium: 8 mg/dL — ABNORMAL LOW (ref 8.4–10.5)
Creatinine, Ser: 4.42 mg/dL — ABNORMAL HIGH (ref 0.50–1.10)
GFR calc non Af Amer: 9 mL/min — ABNORMAL LOW (ref >90)
Glucose, Bld: 309 mg/dL — ABNORMAL HIGH (ref 70–99)

## 2012-08-11 LAB — GLUCOSE, CAPILLARY: Glucose-Capillary: 119 mg/dL — ABNORMAL HIGH (ref 70–99)

## 2012-08-11 MED ORDER — DARBEPOETIN ALFA-POLYSORBATE 100 MCG/0.5ML IJ SOLN
INTRAMUSCULAR | Status: AC
  Administered 2012-08-11: 100 ug via INTRAVENOUS
  Filled 2012-08-11: qty 0.5

## 2012-08-11 MED ORDER — BISACODYL 10 MG RE SUPP
10.0000 mg | Freq: Every day | RECTAL | Status: DC | PRN
  Filled 2012-08-11: qty 1

## 2012-08-11 MED ORDER — INSULIN DETEMIR 100 UNIT/ML ~~LOC~~ SOLN
5.0000 [IU] | Freq: Every day | SUBCUTANEOUS | Status: DC
  Administered 2012-08-11 – 2012-08-13 (×3): 5 [IU] via SUBCUTANEOUS
  Filled 2012-08-11: qty 10

## 2012-08-11 MED ORDER — INSULIN ASPART 100 UNIT/ML ~~LOC~~ SOLN
0.0000 [IU] | Freq: Three times a day (TID) | SUBCUTANEOUS | Status: DC
  Administered 2012-08-11: 5 [IU] via SUBCUTANEOUS
  Administered 2012-08-12: 2 [IU] via SUBCUTANEOUS
  Administered 2012-08-12: 5 [IU] via SUBCUTANEOUS
  Administered 2012-08-12: 2 [IU] via SUBCUTANEOUS
  Administered 2012-08-13 (×3): 3 [IU] via SUBCUTANEOUS
  Administered 2012-08-14 (×2): 7 [IU] via SUBCUTANEOUS
  Administered 2012-08-15: 3 [IU] via SUBCUTANEOUS
  Administered 2012-08-16 (×2): 1 [IU] via SUBCUTANEOUS
  Administered 2012-08-17 (×2): 2 [IU] via SUBCUTANEOUS

## 2012-08-11 MED ORDER — INFLUENZA VIRUS VACC SPLIT PF IM SUSP
0.5000 mL | INTRAMUSCULAR | Status: AC
  Administered 2012-08-12: 0.5 mL via INTRAMUSCULAR
  Filled 2012-08-11: qty 0.5

## 2012-08-11 MED ORDER — DOXERCALCIFEROL 4 MCG/2ML IV SOLN
INTRAVENOUS | Status: AC
  Administered 2012-08-11: 0.5 ug via INTRAVENOUS
  Filled 2012-08-11: qty 2

## 2012-08-11 MED ORDER — HEPARIN SODIUM (PORCINE) 1000 UNIT/ML IJ SOLN
4000.0000 [IU] | Freq: Once | INTRAMUSCULAR | Status: AC
  Administered 2012-08-11: 4000 [IU] via INTRAVENOUS

## 2012-08-11 NOTE — Progress Notes (Signed)
[facility] PROGRESS NOTE  Linda Linda [name] WUJ:[id number] DOB: [birth year] DOA: 08/02/2012 PCP: Colon Branch, MD Brief Narrative: This is Linda [name] 79 year old female nursing facility resident, with known history of ESRD on HD T-T-S, chronic anemia, DM, dyslipidemia, secondary hyperparathyroidism, HTN, PVD, s/p bilateral BKA, GERD, sacral decubitus s/p recent debridement,s/p recent hospitalization at [facility], for pyelonephritis, treated with Tobramycin, then discharged on Vancomycin with HD. Apparently, patient is s/p HD on 08/02/12, which she completed without incident, then en route to the nursing facility, she had an episode of unresponsiveness, associated with facial drooping, lasting for about 20 mins, before patient arrived at the nursing facility. She was then brought to the ED. Per ED MD, patient on arrival, was asymptomatic and at baseline mental status. She was admitted for further evaluation and management.   Assessment/Plan: Altered mental status/CVA Patient presented with what was described as an episode of very poor responsiveness, associated with facial droop, following HD. Clinically, she does have some weakness of left upper extremity. Head CT scan revealed an infarct within the right parietal lobe which may be subacute to chronic. Patient reportedly, was last seen normal at 2:15 PM on 08/02/12, so she was outside the window for t-PA, and symptoms had improved on arrival, although her other co-morbidities, including Linda [name] moderate anemia, may also have been contraindications. Dr Wyatt Portela provided neurology consultation and CVA w/u, carried out. Brain MRI showed subcentimeter left frontal subcortical white matter infarct, approximately 5 mm in diameter. No hemorrhage. Advanced atrophy and extensive chronic microvascular ischemic change. Remote ischemic events are stable. MRA revealed potentially flow reducing lesion supraclinoid internal carotid artery on the right, estimated  50-75% stenosis. Widespread intracranial atherosclerotic disease elsewhere. 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 Carotid doppler showed no evidence of hemodynamically significant internal carotid artery stenosis. Vertebral artery flow is antegrade. 2D Echocardiogram revealed normal LV cavity size, mild LVH and EF of 65% to 70%. Continued on anti-platelet medication. EKG shows SR, with old Q-waves in V1-V2, but no acute ischemic changes. SLP has evaluated and recommended Dysphagia 1 with pudding thick consistency.  Due to failure to thrive, PEG was placed on 08/09/2012 for nutrition.  ESRD (end stage renal disease) Patient is on HD T-T-S, and is s/p hemodialysis on 08/02/12. Dr Delano Metz provided nephrology consultation and regular dialysis schedule has been reinstated. Patient was found on 08/07/12, patient to have Linda [name] clotted AV-fistula and had declotting performed on 08/08/2012.  Discussed with Dr. Briant Cedar due to patient's decubitus ulcer may have difficulty sitting for outpatient dialysis. Will attempt to sit the patient and see how she tolerates it prior to HD. May need to consider LTAC.  Diabetes mellitus, type 2 Patient has insulin-requiring type 2 diabetes mellitus. CBG was 66 on presentation, but came up to 80, while in the ED. Hypoglycemia resolved. Now on SSI, monitor CBGs. Start levemir at 5 units SQ qhs.  HTN (hypertension) Better controlled. On amlodipine 2.5 mg daily. Clonidine patch 0.2 mg.   Anemia Normocytic and due to chronic disease and ESRD. Following CBC, but HB is gradually drifting down. Patient transfused 2 units of pRBC on 08/08/2012.  Sacral decubitus ulcer Patient has Linda [name] rather large, approximately 8 cm x 9 cm stage 4 sacral decubitus, and is reportedly, s/p recent debridement. Wound shows evidence of granulation, and does not appear overly infected. Utilizing mattress overlay. Patient has been  evaluated by [facility], has undergone further sharp debridement on 08/04/12, as  well as hydrotherapy. Managing as recommended.   Recent pyelonephritis Patient is reportedly, s/p recent hospitalization at [facility], for Linda [name] pyelonephritis, for which she was treated with Tobramycin, then Vancomycin. She is afebrile, and does not look toxic. Repeat urinalysis revealed pyuria, but culture showed no growth.  Failure to thrive  It appears that patient has had FTT and poor oral intake. Have discussed with daughter on 08/04/12, and after conferring with family, they have elected to proceed. PEG placed on 08/09/2012 and started on tube feeds on 08/10/2012.  Patient and family had Linda [name] meeting with palliative care, family changed code status to DNR, but wanted to continue medical management including HD. Depending on patient's clinical progress after discharge may make further decisions.  Code Status: DNR/DNI. Family Communication: No family at bedside.  Disposition Plan: Pending.    Consultants:  Dr. Leroy Kennedy, neurology  Dr. Dayle Points. Briant Cedar, renal  Palliative care  Procedures:  Head CT  CXR  Antibiotics:  None.  HPI/Subjective: Sleeping.  Objective: Filed Vitals:   08/10/12 2311 08/11/12 0220 08/11/12 0448 08/11/12 1000  BP: 139/43 129/68 132/66 152/71  Pulse: 104 102 104 103  Temp:  98.3 F (36.8 C) 98.6 F (37 C)   TempSrc:  Oral Oral   Resp:  17 18 18   Height:      Weight:      SpO2:  96% 96%    No intake or output data in the 24 hours ending 08/11/12 1156 Filed Weights   08/07/12 0645 08/07/12 0800 08/08/12 1334  Weight: 45.4 kg (100 lb 1.4 oz) 45.4 kg (100 lb 1.4 oz) 43.4 kg (95 lb 10.9 oz)    Exam: Physical Exam: General: Sleeping, No acute distress. HEENT: Eyes closed. Neck: Supple CV: S1 and S2 Lungs: Clear to ascultation bilaterally Abdomen: Soft, Nontender, Nondistended, +bowel sounds. Ext: Good pulses. Trace edema.  Data Reviewed: Basic Metabolic  Panel:  Lab 08/11/12 1610 08/10/12 9604 08/09/12 5409 08/08/12 8119 08/07/12 0712 08/06/12 0652 08/05/12 0640  NA 141 144 144 144 142 -- --  K 3.9 4.3 4.3 3.9 3.6 -- --  CL 102 104 102 105 101 -- --  CO2 26 26 27 28 27  -- --  GLUCOSE 309* 105* 118* 203* 149* -- --  BUN 40* 21 13 27* 30* -- --  CREATININE 4.42* 3.45* 2.41* 4.67* 5.34* -- --  CALCIUM 8.0* 8.4 8.5 8.4 8.3* -- --  MG -- -- -- -- -- -- --  PHOS -- -- -- 4.4 4.6 4.5 3.7   Liver Function Tests:  Lab 08/08/12 1478 08/07/12 0712 08/06/12 0652 08/05/12 0640  AST -- -- -- --  ALT -- -- -- --  ALKPHOS -- -- -- --  BILITOT -- -- -- --  PROT -- -- -- --  ALBUMIN 2.1* 2.1* 2.0* 2.1*   No results found for this basename: LIPASE:5,AMYLASE:5 in the last 168 hours No results found for this basename: AMMONIA:5 in the last 168 hours CBC:  Lab 08/11/12 0629 08/10/12 0610 08/09/12 0855 08/08/12 0633 08/07/12 0824  WBC 11.7* 11.0* 13.1* 7.2 8.9  NEUTROABS -- -- -- -- --  HGB 10.8* 10.5* 11.2* 7.1* 7.7*  HCT 35.4* 33.8* 35.5* 23.7* 24.8*  MCV 90.3 89.4 88.5 87.8 86.1  PLT 182 170 191 241 267   Cardiac Enzymes: No results found for this basename: CKTOTAL:5,CKMB:5,CKMBINDEX:5,TROPONINI:5 in the last 168 hours BNP (last 3 results)  Basename 09/14/11 1223  PROBNP 8852.0*   CBG:  Lab 08/11/12 2956 08/11/12 0456 08/10/12  2025 08/10/12 1652 08/10/12 1117  GLUCAP 288* 271* 179* 115* 101*    Recent Results (from the past 240 hour(s))  URINE CULTURE     Status: Normal   Collection Time   08/02/12  6:43 PM      Component Value Range Status Comment   Specimen Description URINE, CATHETERIZED   Final    Special Requests NONE   Final    Culture  Setup Time 08/03/2012 14:26   Final    Colony Count NO GROWTH   Final    Culture NO GROWTH   Final    Report Status 08/04/2012 FINAL   Final   MRSA PCR SCREENING     Status: Abnormal   Collection Time   08/02/12  9:45 PM      Component Value Range Status Comment   MRSA by PCR POSITIVE  (*) NEGATIVE Final      Studies: Ir Gastrostomy Tube Mod Sed  08/09/2012  *RADIOLOGY REPORT*  PULL THROUGH GASTROSTOMY TUBE PLACEMENT UNDER FLUOROSCOPIC GUIDANCE  Date: August 09, 2012  Clinical History: 79 year old female with Linda [name] history of stroke, altered mental status, failure to thrive and protein calorie malnutrition.  Linda [name] percutaneous gastrostomy tube is required for parenteral nutrition.  Procedures Performed:  1.  Fluoroscopically guided placement of percutaneous pull-through gastrostomy tube.  Interventional Radiologist:  Sterling Big, MD  Sedation: Moderate (conscious) sedation was used.  1.5 mg Versed, 75 mcg Fentanyl were administered intravenously.  The patient's vital signs were monitored continuously by radiology nursing throughout the procedure.  Sedation Time: 24 minutes  Fluoroscopy time: 2.4 minutes  Contrast volume: 20 ml Omnipaque-300 administered into the GI tract  Antibiotics:  1g Ancef was administered intravenously within 1 hour of skin incision.  PROCEDURE/FINDINGS:   Informed consent was obtained from the patient following explanation of the procedure, risks, benefits and alternatives. The patient understands, agrees and consents for the procedure. All questions were addressed. Linda [name] time out was performed.  Maximal barrier sterile technique utilized including caps, mask, sterile gowns, sterile gloves, large sterile drape, hand hygiene, and chlorhexadine skin prep.  An angled catheter was advanced over Linda [name] wire under fluoroscopic guidance through the nose, down the esophagus and into the body of the stomach.  The stomach was then insufflated with several 100 ml of air.  Fluoroscopy confirmed location of the gastric bubble, as well as inferior displacement of the barium stained colon.  Under direct fluoroscopic guidance, Linda [name] single T-tack was placed, and the anterior gastric wall drawn up against the anterior abdominal wall. Percutaneous access was then obtained into the mid gastric body  with an 18 gauge sheath needle.  Aspiration of air, and injection of contrast material under fluoroscopy confirmed needle placement.  An Amplatz wire was advanced in the gastric body and the access needle exchanged for Linda [name] 9-French vascular sheath.  Linda [name] snare device was advanced through the vascular sheath and an Amplatz wire advanced through the angled catheter.  The Amplatz wire was successfully snared and this was pulled up through the esophagus and out the mouth.  Linda [name] 20-French Burnell Blanks MIC-PEG tube was then connected to the snare and pulled through the mouth, down the esophagus, into the stomach and out to the anterior abdominal wall. Hand injection of contrast material confirmed intragastric location. The T-tack retention suture was then cut. The pull through peg tube was then secured with the external bumper and capped.  The patient will be observed for several hours with the newly placed tube on  low wall suction to evaluate for any post procedure complication.  The patient tolerated the procedure well, there is no immediate complication.  IMPRESSION:  Successful placement of Linda [name] 20 French pull through gastrostomy tube.  The tube will be ready for use at noon on Friday gender 20 11/04/2012.  Signed,  Sterling Big, MD Vascular & Interventional Radiologist [facility]   Original Report Authenticated By: Malachy Moan, M.D.     Scheduled Meds:    . alteplase  4 mg Intracatheter Once  . amLODipine  2.5 mg Oral Daily  . aspirin  300 mg Rectal Daily   Or  . aspirin  325 mg Oral Daily  . cloNIDine  0.2 mg Transdermal Weekly  . collagenase   Topical Daily  . darbepoetin (ARANESP) injection - DIALYSIS  100 mcg Intravenous Q Sat-HD  . doxercalciferol  0.5 mcg Intravenous Q T,Th,Sa-HD  . famotidine  20 mg Oral Daily  . feeding supplement (NEPRO CARB STEADY)  474 mL Per Tube BID  . free water  100 mL Per Tube Q6H  . heparin  5,000 Units Subcutaneous Q8H  . influenza  inactive virus  vaccine  0.5 mL Intramuscular Tomorrow-1000  . insulin aspart  0-9 Units Subcutaneous TID WC  . iron polysaccharides  150 mg Oral Daily  . mirtazapine  15 mg Oral QHS  . PARoxetine  20 mg Oral q morning - 10a  . simvastatin  10 mg Oral q1800  . sodium chloride  3 mL Intravenous Q12H   Continuous Infusions:    Active Problems:  Altered mental status  Syncope  ESRD (end stage renal disease)  Diabetes mellitus, type 2  HTN (hypertension)  Anemia  Sacral decubitus ulcer  FTT (failure to thrive) in adult  CVA (cerebral infarction)    Linda Gucciardo A, MD  [facility] Pager [phone]. If 7PM-7AM, please contact night-coverage at [email or web address], password [email or web address] 08/11/2012, 11:56 AM  LOS: 9 days

## 2012-08-11 NOTE — Procedures (Signed)
I was present at this dialysis session. I have reviewed the session itself and made appropriate changes.   Patient is crying out off and on during the HD session. BP dropped 2 hours into HD into the 70's with goal set at 2-3 liters.  No complaints, confused. Exam shows bilat BKA, R AVG with bruit, no edema, lungs are clear.   Palliative care has met with familiy, pt is now a DNR.  Family wants to continue other treatments for now, including dialysis. Will follow.   Vinson Moselle, MD [employer] 08/11/2012, 3:36 PM

## 2012-08-11 NOTE — Progress Notes (Signed)
New Hydro therapy Orders Received; pt changed to 3x/wk; M-W-F.  Will see patient and provide hydrotherapy intervention Monday 08/13/12.  Charlotte Crumb, PT DPT  [phone]

## 2012-08-11 NOTE — Consult Note (Signed)
Patient Linda [name]      DOB: August 11, 1930      JYN:[id number]     Consult Note from the Palliative Medicine Team at [facility]    Consult Requested by: Dr Betti Cruz     PCP: Colon Branch, MD Reason for Consultation: Clarification of GOC and options   Phone Number:[phone]  Assessment of patients Current state: 79 yr old female with ESRD dependant on dialysis, complex medical history; DM, PVD with B BKA, sacral decubiti,  Recent  PEG for nutrition and overall failure to thrive--family is for the first time looking at options for care  This NP Lorinda Creed reviewed medical records, received report from team, assessed the patient and then meet at the patient's bedside along with her son [name] # [phone] and his wife [name], and daughter [name] h# [phone]/Y# [phone]  to discuss diagnosis prognosis, GOC, EOL wishes disposition and options.   A detailed discussion was had today regarding advanced directives.  Concepts specific to code status, artifical feeding and hydration, continued IV antibiotics and rehospitalization was had.  The difference between a aggressive medical intervention path  and a palliative comfort care path for this patient at this time was had.  Values and goals of care important to patient and family were attempted to be elicited.  Concept of continuing dialysis detailed, consider if present living situation at [facility] will even be able to provide the level of care and also to the fact that the patients physical condition does not lend itself to realistic successful treatment.  We discussed the reality that there is an end point to dialysis as a viable option  Concept of Hospice and Palliative Care were discussed  Natural trajectory and expectations at EOL were discussed.  Questions and concerns addressed.  Hard Choices booklet left for review. Family encouraged to call with questions or concerns.  PMT will continue to support  holistically.    Goals of Care: 1.  Code Status: DNR/DNI   2. Scope of Treatment: 1.  Continue with all available medical interventions to prolong life at this time, including dialysis and PEG feedings.  Family needs time to see if new feeding will offere imorovemtn  4. Disposition: Hopeful for disposition back to [facility] (they consider this her home).  Detailed discussion about the realistic option of continuing dialysis as outpatient   3. Symptom Management:   1. Anxiety/Agitation:Ativan 2. Pain: Tylenol 3. Bowel Regimen:Dulcolax 4. Fever:  Tylenol 5. Nausea/Vomiting: Zofran  4. Psychosocial: Emotional support offered to family, all understand the overall poor prognosis but the conversation and process of decision making is very difficult for them as it relates to aggressive vs conservative medical interventions.  Will continue to support and help navigate the process  5. Spiritual: Strong community church support        Patient Documents Completed or Given: Document Given Completed  Advanced Directives Pkt    MOST yes   DNR    Gone from My Sight    Hard Choices  yes    Brief HPI: 79 yr old female with ESRD dependant on dialysis, complex medical history; DM, PVD with B BKA, sacral decubiti,  Recent  PEG for nutrition and overall failure to thrive     GLO:VFIEPP to illicit to to mental status    PMH:  Past Medical History  Diagnosis Date  . Syncope   . End stage kidney disease   . Hyperlipidemia   . Anemia   . Hypertension   .  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 Diabetes mellitus   . GERD (gastroesophageal reflux disease)   . Secondary hyperparathyroidism   . PVD (peripheral vascular disease)      AOZ:HYQMVHQ reviewed. No pertinent past surgical history. I have reviewed the FH and SH and  If appropriate update it with new information. Allergies  Allergen Reactions  . Percocet (Oxycodone-Acetaminophen)     Scheduled Meds:   . alteplase  4 mg Intracatheter Once  . amLODipine  2.5 mg Oral Daily  . aspirin  300 mg Rectal Daily   Or  . aspirin  325 mg Oral Daily  . cloNIDine  0.2 mg Transdermal Weekly  . collagenase   Topical Daily  . darbepoetin (ARANESP) injection - DIALYSIS  100 mcg Intravenous Q Sat-HD  . doxercalciferol  0.5 mcg Intravenous Q T,Th,Sa-HD  . famotidine  20 mg Oral Daily  . feeding supplement (NEPRO CARB STEADY)  474 mL Per Tube BID  . free water  100 mL Per Tube Q6H  . heparin  5,000 Units Subcutaneous Q8H  . influenza  inactive virus vaccine  0.5 mL Intramuscular Tomorrow-1000  . insulin aspart  0-9 Units Subcutaneous TID WC  . iron polysaccharides  150 mg Oral Daily  . mirtazapine  15 mg Oral QHS  . PARoxetine  20 mg Oral q morning - 10a  . simvastatin  10 mg Oral q1800  . sodium chloride  3 mL Intravenous Q12H   Continuous Infusions:  PRN Meds:.sodium chloride, acetaminophen, acetaminophen, LORazepam, ondansetron (ZOFRAN) IV, RESOURCE THICKENUP CLEAR, sodium chloride    BP 132/66  Pulse 104  Temp 98.6 F (37 C) (Oral)  Resp 18  Ht 5\' 2"  (1.575 m)  Wt 43.4 kg (95 lb 10.9 oz)  BMI 17.50 kg/m2  SpO2 96%   PPS:30 % at best  No intake or output data in the 24 hours ending 08/11/12 1009 Physical Exam:  General: NAD, sleepy minimally interactive HEENT:  Moist buccal membranes Chest:   Diminished in bases CVS: RRR Abdomen:soft NT +BS Ext: B AKA Neuro:minimally responsive, unable to follow commands  Labs: CBC    Component Value Date/Time   WBC 11.7* 08/11/2012 0629   RBC 3.92 08/11/2012 0629   HGB 10.8* 08/11/2012 0629   HCT 35.4* 08/11/2012 0629   PLT 182 08/11/2012 0629   MCV 90.3 08/11/2012 0629   MCH 27.6 08/11/2012 0629   MCHC 30.5 08/11/2012 0629   RDW 15.7* 08/11/2012 0629   LYMPHSABS 1.5 08/02/2012 1651   MONOABS 0.7 08/02/2012 1651   EOSABS 0.0 08/02/2012 1651   BASOSABS 0.0 08/02/2012 1651    BMET    Component Value Date/Time   NA 141  08/11/2012 0629   K 3.9 08/11/2012 0629   CL 102 08/11/2012 0629   CO2 26 08/11/2012 0629   GLUCOSE 309* 08/11/2012 0629   BUN 40* 08/11/2012 0629   CREATININE 4.42* 08/11/2012 0629   CALCIUM 8.0* 08/11/2012 0629   GFRNONAA 9* 08/11/2012 0629   GFRAA 10* 08/11/2012 0629    CMP     Component Value Date/Time   NA 141 08/11/2012 0629   K 3.9 08/11/2012 0629   CL 102 08/11/2012 0629   CO2 26 08/11/2012 0629   GLUCOSE 309* 08/11/2012 0629   BUN 40* 08/11/2012 0629   CREATININE 4.42* 08/11/2012 0629   CALCIUM 8.0* 08/11/2012 0629   PROT 7.2 08/02/2012 1651   ALBUMIN 2.1* 08/08/2012 0633   AST 14 08/02/2012 1651  ALT <5 08/02/2012 1651   ALKPHOS 106 08/02/2012 1651   BILITOT 0.1* 08/02/2012 1651   GFRNONAA 9* 08/11/2012 0629   GFRAA 10* 08/11/2012 0629       Time In Time Out Total Time Spent with Patient Total Overall Time  0900 1040 90 min 100 min    Greater than 50%  of this time was spent counseling and coordinating care related to the above assessment and plan.  Discussed above with Dr Joseph Berkshire NP   [phone]

## 2012-08-12 LAB — GLUCOSE, CAPILLARY
Glucose-Capillary: 190 mg/dL — ABNORMAL HIGH (ref 70–99)
Glucose-Capillary: 192 mg/dL — ABNORMAL HIGH (ref 70–99)

## 2012-08-12 NOTE — Progress Notes (Signed)
S: Not able to understand her, mumbling  O:BP 163/55  Pulse 94  Temp 98.4 F (36.9 C) (Oral)  Resp 17  Ht 5\' 2"  (1.575 m)  Wt 41.8 kg (92 lb 2.4 oz)  BMI 16.85 kg/m2  SpO2 99%  Intake/Output Summary (Last 24 hours) at 08/12/12 0926 Last data filed at 08/12/12 0519  Gross per 24 hour  Intake      0 ml  Output   1551 ml  Net  -1551 ml   Weight change:   No results found. BMET    Component Value Date/Time   NA 141 08/11/2012 0629   K 3.9 08/11/2012 0629   CL 102 08/11/2012 0629   CO2 26 08/11/2012 0629   GLUCOSE 309* 08/11/2012 0629   BUN 40* 08/11/2012 0629   CREATININE 4.42* 08/11/2012 0629   CALCIUM 8.0* 08/11/2012 0629   GFRNONAA 9* 08/11/2012 0629   GFRAA 10* 08/11/2012 0629   CBC    Component Value Date/Time   WBC 11.7* 08/11/2012 0629   RBC 3.92 08/11/2012 0629   HGB 10.8* 08/11/2012 0629   HCT 35.4* 08/11/2012 0629   PLT 182 08/11/2012 0629   MCV 90.3 08/11/2012 0629   MCH 27.6 08/11/2012 0629   MCHC 30.5 08/11/2012 0629   RDW 15.7* 08/11/2012 0629   LYMPHSABS 1.5 08/02/2012 1651   MONOABS 0.7 08/02/2012 1651   EOSABS 0.0 08/02/2012 1651   BASOSABS 0.0 08/02/2012 1651   Exam: BP 163/55  Pulse 94  Temp 98.4 F (36.9 C) (Oral)  Resp 17  Ht 5\' 2"  (1.575 m)  Wt 41.8 kg (92 lb 2.4 oz)  BMI 16.85 kg/m2  SpO2 99% Gen: awake but doesn't open eyes, unintelligible mumbling except for "Lynesha" CVS:RRR Resp:clear ant and lat Abd:+ BS NTND  PEG tube in place Ext:Bil BKA, Rt AVG + bruit.  Both arms contracted, R > L. LUE w rhythmic movements Neuro: as above, gen weakness, not able to sit up or move herself about in the bed  Dialysis prescription:  Tuesday/Thursday/Saturday, 3 hours, blood flow rate 350, dialysate flow rate 600, right upper arm arteriovenous graft, 15-gauge needles, 2K/2.5 calcium, estimated dry weight 51.5 kg, Epogen 5500 units 3 times a week, Hectorol 0.5 mcg 3 times a week and Venofer 50 mg every Thursday     Assessment/Recommendations 1. Acute  stroke 2. Anemia  SP transfusion  3. Sec HPTH 4. ESRD, hd tts 5. Clotted access  SP Declot 6. Stage IV decub 7. S/P PEG 8. DNR  Plan: 1. Patient is very weak and won't be able to dialyze in a chair for the foreseeable future. Recommend LTAC placement  Vinson Moselle  MD [facility] [phone] pgr    [phone] cell 08/12/2012, 9:47 AM

## 2012-08-12 NOTE — Progress Notes (Signed)
[facility] PROGRESS NOTE  Linda [name] AOZ:[id number] DOB: 1931/06/14 DOA: 08/02/2012 PCP: Colon Branch, MD Brief Narrative: This is a 79 year old female nursing facility resident, with known history of ESRD on HD T-T-S, chronic anemia, DM, dyslipidemia, secondary hyperparathyroidism, HTN, PVD, s/p bilateral BKA, GERD, sacral decubitus s/p recent debridement,s/p recent hospitalization at [facility], for pyelonephritis, treated with Tobramycin, then discharged on Vancomycin with HD. Apparently, patient is s/p HD on 08/02/12, which she completed without incident, then en route to the nursing facility, she had an episode of unresponsiveness, associated with facial drooping, lasting for about 20 mins, before patient arrived at the nursing facility. She was then brought to the ED. Per ED MD, patient on arrival, was asymptomatic and at baseline mental status. She was admitted for further evaluation and management.   Assessment/Plan: Altered mental status/CVA Patient presented with what was described as an episode of very poor responsiveness, associated with facial droop, following HD. Clinically, she does have some weakness of left upper extremity. Head CT scan revealed an infarct within the right parietal lobe which may be subacute to chronic. Patient reportedly, was last seen normal at 2:15 PM on 08/02/12, so she was outside the window for t-PA, and symptoms had improved on arrival, although her other co-morbidities, including a moderate anemia, may also have been contraindications. Dr. Wyatt Portela provided neurology consultation and CVA w/u, carried out. Brain MRI showed subcentimeter left frontal subcortical white matter infarct, approximately 5 mm in diameter. No hemorrhage. Advanced atrophy and extensive chronic microvascular ischemic change. Remote ischemic events are stable. MRA revealed potentially flow reducing lesion supraclinoid internal carotid artery on the right, estimated  50-75% stenosis. Widespread intracranial atherosclerotic disease elsewhere. 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 Carotid doppler showed no evidence of hemodynamically significant internal carotid artery stenosis. Vertebral artery flow is antegrade. 2D Echocardiogram revealed normal LV cavity size, mild LVH and EF of 65% to 70%. Continued on anti-platelet medication. EKG shows SR, with old Q-waves in V1-V2, but no acute ischemic changes. SLP has evaluated and recommended Dysphagia 1 with pudding thick consistency.  Due to failure to thrive, PEG was placed on 08/09/2012 for nutrition.  ESRD (end stage renal disease) Patient is on HD T-T-S, and is s/p hemodialysis on 08/02/12. Dr Delano Metz provided nephrology consultation and regular dialysis schedule has been reinstated. Patient was found on 08/07/12, patient to have a clotted AV-fistula and had declotting performed on 08/08/2012.  Discussed with Dr. Briant Cedar due to patient's decubitus ulcer may have difficulty sitting for outpatient dialysis. Had HD on 08/11/2012. May need to consider LTAC.  Diabetes mellitus, type 2 Patient has insulin-requiring type 2 diabetes mellitus. CBG was 66 on presentation, but came up to 80, while in the ED. Hypoglycemia resolved. Now on SSI. Continue levemir at 5 units SQ qhs. CBGs stable.  HTN (hypertension) Better controlled. On amlodipine 2.5 mg daily. Clonidine patch 0.2 mg.   Anemia Normocytic and due to chronic disease and ESRD. Patient transfused 2 units of pRBC on 08/08/2012. Hemoglobin stable.  Sacral decubitus ulcer Patient has a rather large, approximately 8 cm x 9 cm stage 4 sacral decubitus, and is reportedly, s/p recent debridement. Wound shows evidence of granulation, and does not appear overly infected. Utilizing mattress overlay. Patient has been evaluated by [facility], has undergone further sharp debridement on 08/04/12, as well as  hydrotherapy. Managing as recommended.   Recent pyelonephritis Patient is reportedly, s/p recent hospitalization at  [facility], for a pyelonephritis, for which she was treated with Tobramycin, then Vancomycin. She is afebrile, and does not look toxic. Repeat urinalysis revealed pyuria, but culture showed no growth.  Failure to thrive  It appears that patient has had FTT and poor oral intake. Dr. Brien Few has discussed with daughter on 08/04/12, and after conferring with family, they have elected to proceed. PEG placed on 08/09/2012 and started on tube feeds on 08/10/2012.  Patient and family had a meeting with palliative care, family changed code status to DNR, but wanted to continue medical management including HD.  Code Status: DNR/DNI. Family Communication: No family at bedside.  Disposition Plan: Pending.   Consultants:  Dr. Leroy Kennedy, neurology  Dr. Dayle Points. Briant Cedar, renal  Palliative care  Procedures:  Head CT  CXR  Antibiotics:  None.  HPI/Subjective: Sleeping.  Objective: Filed Vitals:   08/11/12 1831 08/11/12 2000 08/12/12 0204 08/12/12 0454  BP:  131/49 99/63 163/55  Pulse:  88 99 94  Temp:  98.1 F (36.7 C) 98.5 F (36.9 C) 98.4 F (36.9 C)  TempSrc:  Oral Oral Oral  Resp:  17 18 17   Height:      Weight: 41.8 kg (92 lb 2.4 oz)     SpO2:  100% 100% 99%    Intake/Output Summary (Last 24 hours) at 08/12/12 0908 Last data filed at 08/12/12 0519  Gross per 24 hour  Intake      0 ml  Output   1551 ml  Net  -1551 ml   Filed Weights   08/08/12 1334 08/11/12 1335 08/11/12 1831  Weight: 43.4 kg (95 lb 10.9 oz) 43.4 kg (95 lb 10.9 oz) 41.8 kg (92 lb 2.4 oz)    Exam: Physical Exam: General: Sleeping, No acute distress. HEENT: Eyes closed. Neck: Supple CV: S1 and S2 Lungs: Clear to ascultation bilaterally Abdomen: Soft, Nontender, Nondistended, +bowel sounds. Ext: Good pulses. Trace edema.  Data Reviewed: Basic Metabolic Panel:  Lab 08/11/12  4098 08/10/12 0610 08/09/12 0855 08/08/12 1191 08/07/12 0712 08/06/12 0652  NA 141 144 144 144 142 --  K 3.9 4.3 4.3 3.9 3.6 --  CL 102 104 102 105 101 --  CO2 26 26 27 28 27  --  GLUCOSE 309* 105* 118* 203* 149* --  BUN 40* 21 13 27* 30* --  CREATININE 4.42* 3.45* 2.41* 4.67* 5.34* --  CALCIUM 8.0* 8.4 8.5 8.4 8.3* --  MG -- -- -- -- -- --  PHOS -- -- -- 4.4 4.6 4.5   Liver Function Tests:  Lab 08/08/12 4782 08/07/12 0712 08/06/12 0652  AST -- -- --  ALT -- -- --  ALKPHOS -- -- --  BILITOT -- -- --  PROT -- -- --  ALBUMIN 2.1* 2.1* 2.0*   No results found for this basename: LIPASE:5,AMYLASE:5 in the last 168 hours No results found for this basename: AMMONIA:5 in the last 168 hours CBC:  Lab 08/11/12 0629 08/10/12 0610 08/09/12 0855 08/08/12 0633 08/07/12 0824  WBC 11.7* 11.0* 13.1* 7.2 8.9  NEUTROABS -- -- -- -- --  HGB 10.8* 10.5* 11.2* 7.1* 7.7*  HCT 35.4* 33.8* 35.5* 23.7* 24.8*  MCV 90.3 89.4 88.5 87.8 86.1  PLT 182 170 191 241 267   Cardiac Enzymes: No results found for this basename: CKTOTAL:5,CKMB:5,CKMBINDEX:5,TROPONINI:5 in the last 168 hours BNP (last 3 results)  Basename 09/14/11 1223  PROBNP 8852.0*   CBG:  Lab 08/11/12 2024 08/11/12 1222 08/11/12 0833 08/11/12 0456 08/10/12 2025  GLUCAP 190* 119*  288* 271* 179*    Recent Results (from the past 240 hour(s))  URINE CULTURE     Status: Normal   Collection Time   08/02/12  6:43 PM      Component Value Range Status Comment   Specimen Description URINE, CATHETERIZED   Final    Special Requests NONE   Final    Culture  Setup Time 08/03/2012 14:26   Final    Colony Count NO GROWTH   Final    Culture NO GROWTH   Final    Report Status 08/04/2012 FINAL   Final   MRSA PCR SCREENING     Status: Abnormal   Collection Time   08/02/12  9:45 PM      Component Value Range Status Comment   MRSA by PCR POSITIVE (*) NEGATIVE Final      Studies: No results found.  Scheduled Meds:    . alteplase  4 mg  Intracatheter Once  . amLODipine  2.5 mg Oral Daily  . aspirin  300 mg Rectal Daily   Or  . aspirin  325 mg Oral Daily  . cloNIDine  0.2 mg Transdermal Weekly  . collagenase   Topical Daily  . darbepoetin (ARANESP) injection - DIALYSIS  100 mcg Intravenous Q Sat-HD  . doxercalciferol  0.5 mcg Intravenous Q T,Th,Sa-HD  . famotidine  20 mg Oral Daily  . feeding supplement (NEPRO CARB STEADY)  474 mL Per Tube BID  . free water  100 mL Per Tube Q6H  . heparin  5,000 Units Subcutaneous Q8H  . influenza  inactive virus vaccine  0.5 mL Intramuscular Tomorrow-1000  . insulin aspart  0-9 Units Subcutaneous TID WC  . insulin detemir  5 Units Subcutaneous QHS  . iron polysaccharides  150 mg Oral Daily  . mirtazapine  15 mg Oral QHS  . PARoxetine  20 mg Oral q morning - 10a  . simvastatin  10 mg Oral q1800  . sodium chloride  3 mL Intravenous Q12H   Continuous Infusions:    Active Problems:  Altered mental status  Syncope  ESRD (end stage renal disease)  Diabetes mellitus, type 2  HTN (hypertension)  Anemia  Sacral decubitus ulcer  FTT (failure to thrive) in adult  CVA (cerebral infarction)  Adult failure to thrive  Pain, generalized    Adolph Clutter A, MD  [facility] Pager [phone]. If 7PM-7AM, please contact night-coverage at [email or web address], password [facility] 08/12/2012, 9:08 AM  LOS: 10 days

## 2012-08-13 LAB — GLUCOSE, CAPILLARY
Glucose-Capillary: 236 mg/dL — ABNORMAL HIGH (ref 70–99)
Glucose-Capillary: 250 mg/dL — ABNORMAL HIGH (ref 70–99)

## 2012-08-13 NOTE — Progress Notes (Signed)
PT HYDROTHERAPY PROGRESS NOTE   08/13/12 0800  Subjective Assessment  Subjective PT mumbling unintelligible'  Date of Onset (prior to admission)  Evaluation and Treatment  Evaluation and Treatment Procedures Explained to Patient/Family Yes  Evaluation and Treatment Procedures Patient unable to consent due to mental status  Wound 08/02/12 Other (Comment) Back Mid;Lower  Date First Assessed/Time First Assessed: 08/02/12 1820   Wound Type: (c) Other (Comment)  Location: Back  Location Orientation: Mid;Lower  Present on Admission: Yes  Site / Wound Assessment Pink;Red;Yellow  % Wound base Red or Granulating 70%  % Wound base Yellow 20%  % Wound base Black 5% (less than)  % Wound base Other (Comment) 5% (greater than)  Peri-wound Assessment Pink;Erythema (blanchable)  Margins Unattacted edges (unapproximated)  Closure None  Drainage Amount Moderate  Drainage Description Serosanguineous  Non-staged Wound Description Full thickness  Treatment Hydrotherapy (Pulse lavage);Packing (Saline gauze);Tape changed  Dressing Type ABD;Other (Comment);Barrier Film (skin prep);Gauze (Comment)  Dressing Changed Changed  Dressing Status Dry;Intact  Hydrotherapy  Pulsed Lavage with Suction (psi) 4 psi  Pulsed Lavage with Suction - Normal Saline Used 1000 mL  Pulsed Lavage Tip Tip with splash shield  Pulsed lavage therapy - wound location Sacral wound  Selective Debridement  Selective Debridement - Location sacral wound  Selective Debridement - Tools Used Forceps;Scalpel;Scissors  Selective Debridement - Tissue Removed larger area of black removed at 9 oclock  Wound Therapy - Assess/Plan/Recommendations  Wound Therapy - Clinical Statement Wound continues to make progress in healing.  Applied Santyl ointment with dressing today to facilitate chemical debridement of unviable tissue.  Will cotninue to perform hydrotherapy as indicated.  Wound Therapy - Functional Problem List decreased skin integrity,  decreased functional mobility  Factors Delaying/Impairing Wound Healing Altered sensation;Diabetes Mellitus;Multiple medical problems;Vascular compromise;Infection - systemic/local  Hydrotherapy Plan Debridement;Patient/family education;Pulsatile lavage with suction  Wound Therapy - Frequency 6X / week  Wound Plan Will change to 3x wk as indicated in new orders  Wound Therapy Goals - Improve the function of patient's integumentary system by progressing the wound(s) through the phases of wound healing by:  Decrease Necrotic Tissue to <10%  Decrease Necrotic Tissue - Progress Progressing toward goal  Increase Granulation Tissue to >90%  Increase Granulation Tissue - Progress Progressing toward goal  Improve Drainage Characteristics Min  Improve Drainage Characteristics - Progress Not progressing  Time For Goal Achievement 7 days  Wound Therapy - Potential for Goals Fair     Charlotte Crumb, PT DPT  [phone]

## 2012-08-13 NOTE — Progress Notes (Signed)
Progress Note from the Palliative Medicine Team at [facility]  Subjective: patient easily agitated on exam, unable to follow commands, chronically ill appearing     Objective: Allergies  Allergen Reactions  . Percocet (Oxycodone-Acetaminophen)    Scheduled Meds:   . amLODipine  2.5 mg Oral Daily  . aspirin  300 mg Rectal Daily   Or  . aspirin  325 mg Oral Daily  . cloNIDine  0.2 mg Transdermal Weekly  . collagenase   Topical Daily  . darbepoetin (ARANESP) injection - DIALYSIS  100 mcg Intravenous Q Sat-HD  . doxercalciferol  0.5 mcg Intravenous Q T,Th,Sa-HD  . famotidine  20 mg Oral Daily  . feeding supplement (NEPRO CARB STEADY)  474 mL Per Tube BID  . free water  100 mL Per Tube Q6H  . heparin  5,000 Units Subcutaneous Q8H  . insulin aspart  0-9 Units Subcutaneous TID WC  . insulin detemir  5 Units Subcutaneous QHS  . iron polysaccharides  150 mg Oral Daily  . mirtazapine  15 mg Oral QHS  . PARoxetine  20 mg Oral q morning - 10a  . simvastatin  10 mg Oral q1800  . sodium chloride  3 mL Intravenous Q12H   Continuous Infusions:  PRN Meds:.sodium chloride, acetaminophen, acetaminophen, bisacodyl, LORazepam, ondansetron (ZOFRAN) IV, RESOURCE THICKENUP CLEAR, sodium chloride  BP 145/80  Pulse 83  Temp 98 F (36.7 C) (Oral)  Resp 20  Ht 5\' 2"  (1.575 m)  Wt 41.8 kg (92 lb 2.4 oz)  BMI 16.85 kg/m2  SpO2 98%   PPS: 30 % at best   No intake or output data in the 24 hours ending 08/13/12 1513     Physical Exam:  General: chronically ill appearing, generally uncomfortable HEENT:  moist buccal membranes Chest:   diminished in bases CVS: RRR, no murmur noted Abdomen: soft NT +BS PEG noted site unremarkable Ext: B AKA Neuro:non verbal, easily agit ed   Labs: CBC    Component Value Date/Time   WBC 11.7* 08/11/2012 0629   RBC 3.92 08/11/2012 0629   HGB 10.8* 08/11/2012 0629   HCT 35.4* 08/11/2012 0629   PLT 182 08/11/2012 0629   MCV 90.3 08/11/2012 0629   MCH 27.6  08/11/2012 0629   MCHC 30.5 08/11/2012 0629   RDW 15.7* 08/11/2012 0629   LYMPHSABS 1.5 08/02/2012 1651   MONOABS 0.7 08/02/2012 1651   EOSABS 0.0 08/02/2012 1651   BASOSABS 0.0 08/02/2012 1651    BMET    Component Value Date/Time   NA 141 08/11/2012 0629   K 3.9 08/11/2012 0629   CL 102 08/11/2012 0629   CO2 26 08/11/2012 0629   GLUCOSE 309* 08/11/2012 0629   BUN 40* 08/11/2012 0629   CREATININE 4.42* 08/11/2012 0629   CALCIUM 8.0* 08/11/2012 0629   GFRNONAA 9* 08/11/2012 0629   GFRAA 10* 08/11/2012 0629    CMP     Component Value Date/Time   NA 141 08/11/2012 0629   K 3.9 08/11/2012 0629   CL 102 08/11/2012 0629   CO2 26 08/11/2012 0629   GLUCOSE 309* 08/11/2012 0629   BUN 40* 08/11/2012 0629   CREATININE 4.42* 08/11/2012 0629   CALCIUM 8.0* 08/11/2012 0629   PROT 7.2 08/02/2012 1651   ALBUMIN 2.1* 08/08/2012 0633   AST 14 08/02/2012 1651   ALT <5 08/02/2012 1651   ALKPHOS 106 08/02/2012 1651   BILITOT 0.1* 08/02/2012 1651   GFRNONAA 9* 08/11/2012 0629   GFRAA 10* 08/11/2012 1610  Assessment and Plan: 1. Code Status:DNR/DNI   Spoke with son Linda [name] today regarding GOC    -continue dialysis, is open to whatever facility can provide care    -continue with artifical feeding and hydration    -continue antibiotics    -open to rehospitalization  Family wishes to continue all available/offered medical intervention sat this time.  Discussed above with Case manager RN  PMT will continue to support holistically  Lorinda Creed NP [phone]

## 2012-08-13 NOTE — Progress Notes (Signed)
[facility] PROGRESS NOTE  Linda [name] ZOX:[id number] DOB: 1930/11/22 DOA: 08/02/2012 PCP: Colon Branch, MD Brief Narrative: This is a 79 year old female nursing facility resident, with known history of ESRD on HD T-T-S, chronic anemia, DM, dyslipidemia, secondary hyperparathyroidism, HTN, PVD, s/p bilateral BKA, GERD, sacral decubitus s/p recent debridement,s/p recent hospitalization at [facility], for pyelonephritis, treated with Tobramycin, then discharged on Vancomycin with HD. Apparently, patient is s/p HD on 08/02/12, which she completed without incident, then en route to the nursing facility, she had an episode of unresponsiveness, associated with facial drooping, lasting for about 20 mins, before patient arrived at the nursing facility. She was then brought to the ED. Per ED MD, patient on arrival, was asymptomatic and at baseline mental status. She was admitted for further evaluation and management.   Assessment/Plan: Altered mental status/CVA Patient presented with what was described as an episode of very poor responsiveness, associated with facial droop, following HD. Clinically, she does have some weakness of left upper extremity. Head CT scan revealed an infarct within the right parietal lobe which may be subacute to chronic. Patient reportedly, was last seen normal at 2:15 PM on 08/02/12, so she was outside the window for t-PA, and symptoms had improved on arrival, although her other co-morbidities, including a moderate anemia, may also have been contraindications. Dr. Wyatt Portela provided neurology consultation and CVA w/u, carried out. Brain MRI showed subcentimeter left frontal subcortical white matter infarct, approximately 5 mm in diameter. No hemorrhage. Advanced atrophy and extensive chronic microvascular ischemic change. Remote ischemic events are stable. MRA revealed potentially flow reducing lesion supraclinoid internal carotid artery on the right, estimated  50-75% stenosis. Widespread intracranial atherosclerotic disease elsewhere. 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 Carotid doppler showed no evidence of hemodynamically significant internal carotid artery stenosis. Vertebral artery flow is antegrade. 2D Echocardiogram revealed normal LV cavity size, mild LVH and EF of 65% to 70%. Continued on anti-platelet medication. EKG shows SR, with old Q-waves in V1-V2, but no acute ischemic changes. SLP has evaluated and recommended Dysphagia 1 with pudding thick consistency.  Due to failure to thrive, PEG was placed on 08/09/2012 for nutrition.  ESRD (end stage renal disease) Patient is on HD T-T-S, and is s/p hemodialysis on 08/02/12. Dr Delano Metz provided nephrology consultation and regular dialysis schedule has been reinstated. Patient was found on 08/07/12, patient to have a clotted AV-fistula and had declotting performed on 08/08/2012.  Discussed with Dr. Briant Cedar due to patient's decubitus ulcer may have difficulty sitting for outpatient dialysis. Had HD on 08/11/2012. Renal recommending LTAC.  Diabetes mellitus, type 2 Patient has insulin-requiring type 2 diabetes mellitus. CBG was 66 on presentation, but came up to 80, while in the ED. Hypoglycemia resolved. Now on SSI. Continue levemir at 5 units SQ qhs. CBGs stable.  HTN (hypertension) Better controlled. On amlodipine 2.5 mg daily. Clonidine patch 0.2 mg.   Anemia Normocytic and due to chronic disease and ESRD. Patient transfused 2 units of pRBC on 08/08/2012. Hemoglobin stable.  Sacral decubitus ulcer Patient has a rather large, approximately 8 cm x 9 cm stage 4 sacral decubitus, and is reportedly, s/p recent debridement. Wound shows evidence of granulation, and does not appear overly infected. Utilizing mattress overlay. Patient has been evaluated by [facility], has undergone further sharp debridement on 08/04/12, as well as  hydrotherapy. Managing as recommended.   Recent pyelonephritis Patient is reportedly, s/p recent hospitalization at [facility],  for a pyelonephritis, for which she was treated with Tobramycin, then Vancomycin. She is afebrile, and does not look toxic. Repeat urinalysis revealed pyuria, but culture showed no growth.  Failure to thrive  It appears that patient has had FTT and poor oral intake. Dr. Brien Few has discussed with daughter on 08/04/12, and after conferring with family, they have elected to proceed. PEG placed on 08/09/2012 and started on tube feeds on 08/10/2012.  Patient and family had a meeting with palliative care, family changed code status to DNR, but wanted to continue medical management including HD.  Code Status: DNR/DNI. Family Communication: No family at bedside.  Disposition Plan: Pending. DC to LTAC if bed is available.  Consultants:  Dr. Leroy Kennedy, neurology  Dr. Dayle Points. Briant Cedar, renal  Palliative care  Procedures:  Head CT  CXR  Antibiotics:  None.  HPI/Subjective: Awake, speaking incoherently.  Objective: Filed Vitals:   08/12/12 1807 08/12/12 2112 08/12/12 2118 08/13/12 0640  BP: 96/39 153/41 153/41 164/67  Pulse: 89 87 87 93  Temp: 98.4 F (36.9 C)  97.6 F (36.4 C) 97 F (36.1 C)  TempSrc: Axillary Axillary Axillary Axillary  Resp: 20 18 18 20   Height:      Weight:      SpO2: 97% 97% 97% 96%   No intake or output data in the 24 hours ending 08/13/12 0858 Filed Weights   08/08/12 1334 08/11/12 1335 08/11/12 1831  Weight: 43.4 kg (95 lb 10.9 oz) 43.4 kg (95 lb 10.9 oz) 41.8 kg (92 lb 2.4 oz)    Exam: Physical Exam: General: Awake, No acute distress. HEENT: Eyes closed. Neck: Supple CV: S1 and S2 Lungs: Clear to ascultation bilaterally Abdomen: Soft, Nontender, Nondistended, +bowel sounds. Ext: Good pulses. Trace edema. Back: Decubitus ulcer noted.  Data Reviewed: Basic Metabolic Panel:  Lab 08/11/12 1610 08/10/12 0610  08/09/12 0855 08/08/12 0633 08/07/12 0712  NA 141 144 144 144 142  K 3.9 4.3 4.3 3.9 3.6  CL 102 104 102 105 101  CO2 26 26 27 28 27   GLUCOSE 309* 105* 118* 203* 149*  BUN 40* 21 13 27* 30*  CREATININE 4.42* 3.45* 2.41* 4.67* 5.34*  CALCIUM 8.0* 8.4 8.5 8.4 8.3*  MG -- -- -- -- --  PHOS -- -- -- 4.4 4.6   Liver Function Tests:  Lab 08/08/12 0633 08/07/12 0712  AST -- --  ALT -- --  ALKPHOS -- --  BILITOT -- --  PROT -- --  ALBUMIN 2.1* 2.1*   No results found for this basename: LIPASE:5,AMYLASE:5 in the last 168 hours No results found for this basename: AMMONIA:5 in the last 168 hours CBC:  Lab 08/11/12 0629 08/10/12 0610 08/09/12 0855 08/08/12 0633 08/07/12 0824  WBC 11.7* 11.0* 13.1* 7.2 8.9  NEUTROABS -- -- -- -- --  HGB 10.8* 10.5* 11.2* 7.1* 7.7*  HCT 35.4* 33.8* 35.5* 23.7* 24.8*  MCV 90.3 89.4 88.5 87.8 86.1  PLT 182 170 191 241 267   Cardiac Enzymes: No results found for this basename: CKTOTAL:5,CKMB:5,CKMBINDEX:5,TROPONINI:5 in the last 168 hours BNP (last 3 results)  Basename 09/14/11 1223  PROBNP 8852.0*   CBG:  Lab 08/13/12 0633 08/12/12 1654 08/11/12 2024 08/11/12 1222 08/11/12 0833  GLUCAP 236* 192* 190* 119* 288*    No results found for this or any previous visit (from the past 240 hour(s)).   Studies: No results found.  Scheduled Meds:    . amLODipine  2.5 mg Oral Daily  . aspirin  300 mg  Rectal Daily   Or  . aspirin  325 mg Oral Daily  . cloNIDine  0.2 mg Transdermal Weekly  . collagenase   Topical Daily  . darbepoetin (ARANESP) injection - DIALYSIS  100 mcg Intravenous Q Sat-HD  . doxercalciferol  0.5 mcg Intravenous Q T,Th,Sa-HD  . famotidine  20 mg Oral Daily  . feeding supplement (NEPRO CARB STEADY)  474 mL Per Tube BID  . free water  100 mL Per Tube Q6H  . heparin  5,000 Units Subcutaneous Q8H  . insulin aspart  0-9 Units Subcutaneous TID WC  . insulin detemir  5 Units Subcutaneous QHS  . iron polysaccharides  150 mg Oral  Daily  . mirtazapine  15 mg Oral QHS  . PARoxetine  20 mg Oral q morning - 10a  . simvastatin  10 mg Oral q1800  . sodium chloride  3 mL Intravenous Q12H   Continuous Infusions:    Active Problems:  Altered mental status  Syncope  ESRD (end stage renal disease)  Diabetes mellitus, type 2  HTN (hypertension)  Anemia  Sacral decubitus ulcer  FTT (failure to thrive) in adult  CVA (cerebral infarction)  Adult failure to thrive  Pain, generalized    Elonda Giuliano A, MD  [facility] Pager [phone]. If 7PM-7AM, please contact night-coverage at [email or web address], password [facility] 08/13/2012, 8:58 AM  LOS: 11 days

## 2012-08-13 NOTE — Progress Notes (Signed)
SLP Cancellation/Discharge Note  Patient Details Name: Linda [name] MRN: [id number] DOB: 1930-11-25   Cancelled treatment:   F/u to determine potential for PO consumption.  Pt not sufficiently arousable, not following commands, not able to attend adequately for POs.  PEG placed 1/23.  Palliative Care working with family and they have established goals.    Pt unable to participate in SLP for dysphagia management/rehab.  If mental status improves significantly and family wishes to pursue POs with the assist of SLP, then please re-order services.  Otherwise, SLP to sign-off at this time.  Plan: D/C SLP           Please continue vigilant oral care QD.  Tyshaun Vinzant L. Samson Frederic, [place] CCC/SLP Pager [phone]         Blenda Mounts Laurice 08/13/2012, 10:07 AM

## 2012-08-13 NOTE — Progress Notes (Signed)
Inpatient Diabetes Program Recommendations  AACE/ADA: New Consensus Statement on Inpatient Glycemic Control (2013)  Target Ranges:  Prepandial:   less than 140 mg/dL      Peak postprandial:   less than 180 mg/dL (1-2 hours)      Critically ill patients:  140 - 180 mg/dL  Results for Linda [name], IVINS (MRN [id number]) as of 08/13/2012 11:25  Ref. Range 08/12/2012 16:54 08/13/2012 06:33  Glucose-Capillary Latest Range: 70-99 mg/dL 952 (H) 841 (H)    Inpatient Diabetes Program Recommendations Insulin - Basal: Increase Levemir to home dose 10 units  Thank you  Piedad Climes BSN, RN,CDE Inpatient Diabetes Coordinator [phone] (team pager)

## 2012-08-13 NOTE — Progress Notes (Signed)
Subjective:   Very weak in bed, speech not strong unable to understand.  Palliative care is working with family she is now DNR but we are to continue HD Objective Vital signs in last 24 hours: Filed Vitals:   08/12/12 1807 08/12/12 2112 08/12/12 2118 08/13/12 0640  BP: 96/39 153/41 153/41 164/67  Pulse: 89 87 87 93  Temp: 98.4 F (36.9 C)  97.6 F (36.4 C) 97 F (36.1 C)  TempSrc: Axillary Axillary Axillary Axillary  Resp: 20 18 18 20   Height:      Weight:      SpO2: 97% 97% 97% 96%   Weight change:  No intake or output data in the 24 hours ending 08/13/12 1330 Labs: Basic Metabolic Panel:  Lab 08/11/12 4098 08/10/12 0610 08/09/12 0855 08/08/12 0633 08/07/12 0712  NA 141 144 144 -- --  K 3.9 4.3 4.3 -- --  CL 102 104 102 -- --  CO2 26 26 27  -- --  GLUCOSE 309* 105* 118* -- --  BUN 40* 21 13 -- --  CREATININE 4.42* 3.45* 2.41* -- --  CALCIUM 8.0* 8.4 8.5 -- --  ALB -- -- -- -- --  PHOS -- -- -- 4.4 4.6   Liver Function Tests:  Lab 08/08/12 0633 08/07/12 0712  AST -- --  ALT -- --  ALKPHOS -- --  BILITOT -- --  PROT -- --  ALBUMIN 2.1* 2.1*   No results found for this basename: LIPASE:3,AMYLASE:3 in the last 168 hours No results found for this basename: AMMONIA:3 in the last 168 hours CBC:  Lab 08/11/12 0629 08/10/12 0610 08/09/12 0855 08/08/12 0633 08/07/12 0824  WBC 11.7* 11.0* 13.1* -- --  NEUTROABS -- -- -- -- --  HGB 10.8* 10.5* 11.2* -- --  HCT 35.4* 33.8* 35.5* -- --  MCV 90.3 89.4 88.5 87.8 86.1  PLT 182 170 191 -- --   Cardiac Enzymes: No results found for this basename: CKTOTAL:5,CKMB:5,CKMBINDEX:5,TROPONINI:5 in the last 168 hours CBG:  Lab 08/13/12 1210 08/13/12 0633 08/12/12 1654 08/11/12 2024 08/11/12 1222  GLUCAP 250* 236* 192* 190* 119*    Iron Studies: No results found for this basename: IRON,TIBC,TRANSFERRIN,FERRITIN in the last 72 hours Studies/Results: No results found. Medications: Infusions:    Scheduled Medications:    .  amLODipine  2.5 mg Oral Daily  . aspirin  300 mg Rectal Daily   Or  . aspirin  325 mg Oral Daily  . cloNIDine  0.2 mg Transdermal Weekly  . collagenase   Topical Daily  . darbepoetin (ARANESP) injection - DIALYSIS  100 mcg Intravenous Q Sat-HD  . doxercalciferol  0.5 mcg Intravenous Q T,Th,Sa-HD  . famotidine  20 mg Oral Daily  . feeding supplement (NEPRO CARB STEADY)  474 mL Per Tube BID  . free water  100 mL Per Tube Q6H  . heparin  5,000 Units Subcutaneous Q8H  . insulin aspart  0-9 Units Subcutaneous TID WC  . insulin detemir  5 Units Subcutaneous QHS  . iron polysaccharides  150 mg Oral Daily  . mirtazapine  15 mg Oral QHS  . PARoxetine  20 mg Oral q morning - 10a  . simvastatin  10 mg Oral q1800  . sodium chloride  3 mL Intravenous Q12H    have reviewed scheduled and prn medications.  Physical Exam: General: thin, weak, garbled speech Heart: RRR Lungs: mostly clear Abdomen: soft, non tender Extremities:minimal edema Dialysis Access: R AVF, good thrill and bruit   I Assessment/ Plan:  Pt is a 79 y.o. yo female with ESRD who was admitted on 08/02/2012 with  CVA but also has many comorbids including stage 4 decub and essentially bed bound status right now  Assessment/Plan: 1. S/p CVA- slow progress in all rehab areas 2. ESRD - have been continuing HD every TTS via AVF however, not sure patient is a candidate for continued OP HD due to immobility.  Agree with continued conversations between palliative care and family to determine disposition.   3. Anemia- hgb 10.8, on aranesp, continue 4. Secondary hyperparathyroidism- calcium and phos controlled.  On vitamin D 5. HTN/volume- BP up and down. UF as able with HD, gets low BP with HD 6. Dispo- I agree may not be able to return to her previous arrangement, if cant sit up in chair for HD will need LTAC  Linda [name] A   08/13/2012,1:30 PM  LOS: 11 days

## 2012-08-14 LAB — CBC
HCT: 34.6 % — ABNORMAL LOW (ref 36.0–46.0)
Hemoglobin: 10.7 g/dL — ABNORMAL LOW (ref 12.0–15.0)
MCHC: 30.9 g/dL (ref 30.0–36.0)
RDW: 15.8 % — ABNORMAL HIGH (ref 11.5–15.5)
WBC: 11 10*3/uL — ABNORMAL HIGH (ref 4.0–10.5)

## 2012-08-14 LAB — RENAL FUNCTION PANEL
CO2: 28 mEq/L (ref 19–32)
Calcium: 8.1 mg/dL — ABNORMAL LOW (ref 8.4–10.5)
Chloride: 93 mEq/L — ABNORMAL LOW (ref 96–112)
GFR calc Af Amer: 10 mL/min — ABNORMAL LOW (ref >90)
GFR calc non Af Amer: 9 mL/min — ABNORMAL LOW (ref >90)
Glucose, Bld: 391 mg/dL — ABNORMAL HIGH (ref 70–99)
Potassium: 3.5 mEq/L (ref 3.5–5.1)
Sodium: 132 mEq/L — ABNORMAL LOW (ref 135–145)

## 2012-08-14 LAB — GLUCOSE, CAPILLARY
Glucose-Capillary: 326 mg/dL — ABNORMAL HIGH (ref 70–99)
Glucose-Capillary: 329 mg/dL — ABNORMAL HIGH (ref 70–99)

## 2012-08-14 MED ORDER — ASPIRIN 325 MG PO TABS: 325.0000 mg | ORAL_TABLET | Freq: Every day | ORAL | Status: AC

## 2012-08-14 MED ORDER — COLLAGENASE 250 UNIT/GM EX OINT: TOPICAL_OINTMENT | Freq: Every day | CUTANEOUS | Status: AC

## 2012-08-14 MED ORDER — FREE WATER: 100.0000 mL | Freq: Four times a day (QID) | Status: AC

## 2012-08-14 MED ORDER — NEPRO/CARBSTEADY PO LIQD: 474.0000 mL | Freq: Two times a day (BID) | ORAL | Status: AC

## 2012-08-14 MED ORDER — INSULIN DETEMIR 100 UNIT/ML ~~LOC~~ SOLN
10.0000 [IU] | Freq: Every day | SUBCUTANEOUS | Status: DC
  Administered 2012-08-14 – 2012-08-16 (×3): 10 [IU] via SUBCUTANEOUS

## 2012-08-14 MED ORDER — DOXERCALCIFEROL 4 MCG/2ML IV SOLN
INTRAVENOUS | Status: AC
  Filled 2012-08-14: qty 2

## 2012-08-14 MED ORDER — ACETAMINOPHEN 325 MG PO TABS: 650.0000 mg | ORAL_TABLET | ORAL | Status: AC | PRN

## 2012-08-14 MED ORDER — DARBEPOETIN ALFA-POLYSORBATE 100 MCG/0.5ML IJ SOLN: 100.0000 ug | INTRAMUSCULAR | Status: AC

## 2012-08-14 MED ORDER — BISACODYL 10 MG RE SUPP: 10.0000 mg | Freq: Every day | RECTAL | Status: AC | PRN

## 2012-08-14 MED ORDER — CLONIDINE HCL 0.2 MG/24HR TD PTWK: 1.0000 | MEDICATED_PATCH | TRANSDERMAL | Status: AC

## 2012-08-14 MED ORDER — INSULIN ASPART 100 UNIT/ML ~~LOC~~ SOLN: 0.0000 [IU] | Freq: Three times a day (TID) | SUBCUTANEOUS | Status: AC

## 2012-08-14 MED ORDER — AMLODIPINE BESYLATE 2.5 MG PO TABS: 2.5000 mg | ORAL_TABLET | Freq: Every day | ORAL | Status: AC

## 2012-08-14 MED ORDER — DOXERCALCIFEROL 4 MCG/2ML IV SOLN: 0.5000 ug | INTRAVENOUS | Status: AC

## 2012-08-14 NOTE — Progress Notes (Signed)
NCM spoke to [facility], and no bed availability. Attempted call to dtr, [name] for alternate LTAC. Left message in vm for a return call. Isidoro Donning RN CCM Case Mgmt phone [phone]

## 2012-08-14 NOTE — Progress Notes (Signed)
Dialysis initiated using aeseptic technique via RUA AVG up/down without difficulty.  Outpatient plan of care on chart/reviewed

## 2012-08-14 NOTE — Discharge Summary (Signed)
Physician Discharge Summary  Linda [name] ZOX:[id number] DOB: [birth year] DOA: 08/02/2012  PCP: Colon Branch, MD  Admit date: 08/02/2012 Discharge date: 08/14/2012  Time spent: 40 minutes  Recommendations for Outpatient Follow-up:  1. To LTAC  Discharge Diagnoses:  Active Problems:  Altered mental status  Syncope  ESRD (end stage renal disease)  Diabetes mellitus, type 2  HTN (hypertension)  Anemia  Sacral decubitus ulcer  FTT (failure to thrive) in adult  CVA (cerebral infarction)  Adult failure to thrive  Pain, generalized   Discharge Condition: Stable  Diet recommendation: NPO, on tube feeds.  Filed Weights   08/08/12 1334 08/11/12 1335 08/11/12 1831  Weight: 43.4 kg (95 lb 10.9 oz) 43.4 kg (95 lb 10.9 oz) 41.8 kg (92 lb 2.4 oz)    History of present illness:  This is a 79 year old female nursing facility resident, with known history of ESRD on HD T-T-S, chronic anemia, DM, dyslipidemia, secondary hyperparathyroidism, HTN, PVD, s/p bilateral BKA, GERD, sacral decubitus s/p recent debridement,s/p recent hospitalization at [facility], for pyelonephritis, treated with Tobramycin, then discharged on Vancomycin with HD. Apparently, patient is s/p HD on 08/02/12, which she completed without incident, then en route to the nursing facility, she had an episode of unresponsiveness, associated with facial drooping, lasting for about 20 mins, before patient arrived at the nursing facility. She was then brought to the ED. Per ED MD, patient on arrival, was asymptomatic and at baseline mental status. She was admitted for further evaluation and management.   Hospital Course:  Altered mental status/CVA  Patient presented with what was described as an episode of very poor responsiveness, associated with facial droop, following HD. Clinically, she does have some weakness of left upper extremity. Head CT scan revealed an infarct within the right parietal lobe which may be subacute  to chronic. Patient reportedly, was last seen normal at 2:15 PM on 08/02/12, so she was outside the window for t-PA, and symptoms had improved on arrival, although her other co-morbidities, including a moderate anemia, may also have been contraindications. Dr. Wyatt Portela provided neurology consultation and CVA w/u, carried out. Brain MRI showed subcentimeter left frontal subcortical white matter infarct, approximately 5 mm in diameter. No hemorrhage. Advanced atrophy and extensive chronic microvascular ischemic change. Remote ischemic events are stable. MRA revealed potentially flow reducing lesion supraclinoid internal carotid artery on the right, estimated 50-75% stenosis. Widespread intracranial atherosclerotic disease elsewhere. 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 Carotid doppler showed no evidence of hemodynamically significant internal carotid artery stenosis. Vertebral artery flow is antegrade. 2D Echocardiogram revealed normal LV cavity size, mild LVH and EF of 65% to 70%. Continued on anti-platelet medication. EKG shows SR, with old Q-waves in V1-V2, but no acute ischemic changes. SLP has evaluated and recommended Dysphagia 1 with pudding thick consistency. Due to failure to thrive, PEG was placed on 08/09/2012 for nutrition.   ESRD (end stage renal disease)  Patient is on HD T-T-S, and is s/p hemodialysis on 08/02/12. Dr Delano Metz provided nephrology consultation and regular dialysis schedule has been reinstated. Patient was found on 08/07/12, patient to have a clotted AV-fistula and had declotting performed on 08/08/2012. Discussed with Dr. Briant Cedar due to patient's decubitus ulcer may have difficulty sitting for outpatient dialysis. Likely benefit from [facility] for wound care and hemodialysis.   Diabetes mellitus, type 2  Patient has insulin-requiring type 2 diabetes mellitus. CBG was 66 on presentation, but came up  to 80, while in the ED. Hypoglycemia resolved. Now on SSI. Continue levemir to 10 units SQ qhs. CBGs stable.   HTN (hypertension)  Better controlled. On amlodipine 2.5 mg daily. Clonidine patch 0.2 mg. Further titration as outpatient.  Anemia  Normocytic and due to chronic disease and ESRD. Patient transfused 2 units of pRBC on 08/08/2012. Hemoglobin stable.   Sacral decubitus ulcer  Patient has a rather large, approximately 8 cm x 9 cm stage 4 sacral decubitus, and is reportedly, s/p recent debridement. Wound shows evidence of granulation, and does not appear overly infected. Utilizing mattress overlay. Patient has been evaluated by [place], has undergone further sharp debridement on 08/04/12, as well as hydrotherapy. Managing as recommended.   Recent pyelonephritis  Patient is reportedly, s/p recent hospitalization at [facility], for a pyelonephritis, for which she was treated with Tobramycin, then Vancomycin. She is afebrile, and does not look toxic. Repeat urinalysis revealed pyuria, but culture showed no growth.   Failure to thrive  It appears that patient has had FTT and poor oral intake. Dr. Brien Few has discussed with daughter on 08/04/12, and after conferring with family, they have elected to proceed. PEG placed on 08/09/2012 and started on tube feeds on 08/10/2012. Patient and family had a meeting with palliative care, family changed code status to DNR, but wanted to continue medical management including HD.   Code Status: DNR/DNI.   Consultants:  Dr. Leroy Kennedy, neurology  Dr. Schertz/Dr. Briant Cedar, renal  Palliative care  Procedures:  Head CT  CXR HD  Antibiotics:  None.  Discharge Exam: Filed Vitals:   08/13/12 1503 08/13/12 2048 08/14/12 0200 08/14/12 0700  BP: 145/80 136/78 155/58 171/60  Pulse: 83 81 85 98  Temp: 98 F (36.7 C) 98.1 F (36.7 C) 98.2 F (36.8 C) 98 F (36.7 C)  TempSrc: Oral Oral Oral Oral  Resp: 20 18 18 20   Height:      Weight:      SpO2: 98% 100%  100% 100%   Discharge Instructions  Discharge Orders    Future Orders Please Complete By Expires   Increase activity slowly      Discharge instructions      Comments:   Diet: Feeding supplement (NEPRO CARB STEADY) liquid 474 mL per Tube, 2 times daily. Free water 100 ccs q6h.       Medication List     As of 08/14/2012  9:44 AM    STOP taking these medications         cloNIDine 0.3 mg/24hr   Commonly known as: CATAPRES - Dosed in mg/24 hr   Replaced by: cloNIDine 0.2 mg/24hr patch      diltiazem 240 MG 24 hr capsule   Commonly known as: CARDIZEM CD      HYDROcodone-acetaminophen 5-325 MG per tablet   Commonly known as: NORCO/VICODIN      insulin lispro 100 UNIT/ML injection   Commonly known as: HUMALOG      PRESCRIPTION MEDICATION      risperiDONE 0.5 MG tablet   Commonly known as: RISPERDAL      WHEY PROTEIN PO      TAKE these medications         acetaminophen 325 MG tablet   Commonly known as: TYLENOL   Take 2 tablets (650 mg total) by mouth every 4 (four) hours as needed (or temp >/= 99.5 F).      amLODipine 2.5 MG tablet   Commonly known as: NORVASC   Take 1 tablet (2.5  mg total) by mouth daily.      aspirin 325 MG tablet   Place 1 tablet (325 mg total) into feeding tube daily.      b complex-vitamin c-folic acid 0.8 MG Tabs   Take 0.8 mg by mouth daily.      bisacodyl 10 MG suppository   Commonly known as: DULCOLAX   Place 1 suppository (10 mg total) rectally daily as needed for constipation.      cloNIDine 0.2 mg/24hr patch   Commonly known as: CATAPRES - Dosed in mg/24 hr   Place 1 patch (0.2 mg total) onto the skin once a week.      collagenase ointment   Commonly known as: SANTYL   Apply topically daily.      darbepoetin 100 MCG/0.5ML Soln   Commonly known as: ARANESP   Inject 0.5 mLs (100 mcg total) into the vein every Saturday with hemodialysis.      doxercalciferol 4 MCG/2ML injection   Commonly known as: HECTOROL   Inject 0.25 mLs  (0.5 mcg total) into the vein Every Tuesday,Thursday,and Saturday with dialysis.      feeding supplement (NEPRO CARB STEADY) Liqd   Place 474 mLs into feeding tube 2 (two) times daily.      free water Soln   Place 100 mLs into feeding tube every 6 (six) hours.      insulin aspart 100 UNIT/ML injection   Commonly known as: novoLOG   Inject 0-9 Units into the skin 3 (three) times daily with meals. Correction coverage: Sensitive (thin, NPO, renal)  CBG < 70: implement hypoglycemia protocol  CBG 70 - 120: 0 units  CBG 121 - 150: 1 unit  CBG 151 - 200: 2 units  CBG 201 - 250: 3 units  CBG 251 - 300: 5 units  CBG 301 - 350: 7 units  CBG 351 - 400: 9 units  CBG > 400: call MD      insulin detemir 100 UNIT/ML injection   Commonly known as: LEVEMIR   Inject 10 Units into the skin at bedtime.      iron polysaccharides 150 MG capsule   Commonly known as: NIFEREX   Take 150 mg by mouth daily.      mirtazapine 15 MG tablet   Commonly known as: REMERON   Take 15 mg by mouth at bedtime.      ondansetron 4 MG tablet   Commonly known as: ZOFRAN   Take 4 mg by mouth every 12 (twelve) hours as needed. For nausea/vomiting      PARoxetine 20 MG tablet   Commonly known as: PAXIL   Take 20 mg by mouth every morning.      polyethylene glycol packet   Commonly known as: MIRALAX / GLYCOLAX   Take 17 g by mouth 2 (two) times daily.      ranitidine 150 MG tablet   Commonly known as: ZANTAC   Take 150 mg by mouth at bedtime.      simvastatin 10 MG tablet   Commonly known as: ZOCOR   Take 10 mg by mouth daily.      vitamin C 500 MG tablet   Commonly known as: ASCORBIC ACID   Take 500 mg by mouth 3 (three) times daily.      ZINC-220 220 MG capsule   Generic drug: zinc sulfate   Take 220 mg by mouth daily.           Follow-up Information    Follow up  with Colon Branch, MD. Schedule an appointment as soon as possible for a visit in 1 week.   Contact information:   [address]  [address] [place] [address] [phone]           The results of significant diagnostics from this hospitalization (including imaging, microbiology, ancillary and laboratory) are listed below for reference.    Significant Diagnostic Studies: Ct Head Wo Contrast  08/02/2012  *RADIOLOGY REPORT*  Clinical Data: Altered mental status.  Possible TIA.  CT HEAD WITHOUT CONTRAST  Technique:  Contiguous axial images were obtained from the base of the skull through the vertex without contrast.  Comparison: 10/28/2011  Findings: Infarct noted within the right parietal lobe.  This is new since prior study but is likely subacute to chronic.  Diffuse cerebral atrophy.  Mild chronic small vessel disease changes.  No hydrocephalus.  No hemorrhage.  No mass lesion.  Extensive dural calcifications along the falx.  No acute calvarial abnormality. Mastoids are clear.  IMPRESSION: Infarct within the right parietal lobe which I suspect is subacute to chronic.  Atrophy, chronic small vessel disease.   Original Report Authenticated By: Charlett Nose, M.D.    Mr Brain Wo Contrast  08/03/2012  *RADIOLOGY REPORT*  Clinical Data:  Unresponsiveness associated with facial droop following hemodialysis.  Chronic renal failure.  MRI HEAD WITHOUT CONTRAST MRA HEAD WITHOUT CONTRAST  Technique:  Multiplanar, multiecho pulse sequences of the brain and surrounding structures were obtained without intravenous contrast. Angiographic images of the head were obtained using MRA technique without contrast.  Comparison:  Most recent CT head 08/02/2012.  MRI HEAD  Findings:  The patient had difficulty remaining motionless for the study.  Images are suboptimal.  Small or subtle lesions could be overlooked.  There is a subcentimeter infarct affecting the left frontal white matter in a subcortical location, approximately 5 mm in diameter. No other areas of acute infarction are seen.  There is no   intracranial hemorrhage, mass lesion, hydrocephalus, or  extra-axial fluid. There is advanced atrophy and extensive chronic microvascular ischemic change.  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  Tiny focus chronic hemorrhage left posterior temporal region.  Remote brainstem lacunar infarcts.  Remote left basal ganglia lacunar infarcts.  Extensive dural calcification. No scalp or osseous lesions.  No gross midline abnormality.  Chronic sinus disease.  No acute mastoid fluid. Compared with prior CT, this infarct is not visible.  IMPRESSION: Subcentimeter left frontal subcortical white matter infarct, approximately 5 mm in diameter.  No hemorrhage.  Advanced atrophy and extensive chronic microvascular ischemic change.  Remote ischemic events are stable.  MRA HEAD  Findings: There is a 50-75% stenosis of the supraclinoid right ICA. The left internal carotid artery shows mild nonstenotic irregularity in its supraclinoid segment.  Long segment 75-90% stenosis proximal right anterior cerebral artery.  Both distal ACAs fill from the left.  Mild nonstenotic irregularity of the proximal middle cerebral arteries bilaterally. Poor flow related enhancement of the distal MCA branches on the right is noted, reflecting previous right hemisphere ischemic event.  The basilar artery is widely patent with left vertebral dominant. Hypoplasia of the distal right vertebral relates to primarily supply of this vessel of the PICA.  There is mild irregularity of the distal PCA without proximal narrowing.  Cerebellar branches are poorly seen.  There is a 3 mm aneurysm projecting anteriorly from the right internal carotid artery at the ophthalmic origin.  A tiny inferior outpouching from the  supraclinoid ICA on the left, no more than 1 mm in size (image 73 series 5) could be atherosclerotic in nature, or arise from a paraophthalmic location.  IMPRESSION: Potentially flow reducing lesion supraclinoid internal carotid artery on the right, estimated 50-75%  stenosis.Widespread intracranial atherosclerotic disease elsewhere.  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   Original Report Authenticated By: Davonna Belling, M.D.    Ir Gastrostomy Tube Mod Sed  08/09/2012  *RADIOLOGY REPORT*  PULL THROUGH GASTROSTOMY TUBE PLACEMENT UNDER FLUOROSCOPIC GUIDANCE  Date: August 09, 2012  Clinical History: 79 year old female with a history of stroke, altered mental status, failure to thrive and protein calorie malnutrition.  A percutaneous gastrostomy tube is required for parenteral nutrition.  Procedures Performed:  1.  Fluoroscopically guided placement of percutaneous pull-through gastrostomy tube.  Interventional Radiologist:  Sterling Big, MD  Sedation: Moderate (conscious) sedation was used.  1.5 mg Versed, 75 mcg Fentanyl were administered intravenously.  The patient's vital signs were monitored continuously by radiology nursing throughout the procedure.  Sedation Time: 24 minutes  Fluoroscopy time: 2.4 minutes  Contrast volume: 20 ml Omnipaque-300 administered into the GI tract  Antibiotics:  1g Ancef was administered intravenously within 1 hour of skin incision.  PROCEDURE/FINDINGS:   Informed consent was obtained from the patient following explanation of the procedure, risks, benefits and alternatives. The patient understands, agrees and consents for the procedure. All questions were addressed. A time out was performed.  Maximal barrier sterile technique utilized including caps, mask, sterile gowns, sterile gloves, large sterile drape, hand hygiene, and chlorhexadine skin prep.  An angled catheter was advanced over a wire under fluoroscopic guidance through the nose, down the esophagus and into the body of the stomach.  The stomach was then insufflated with several 100 ml of air.  Fluoroscopy confirmed location of the gastric bubble, as well as inferior displacement of the barium stained  colon.  Under direct fluoroscopic guidance, a single T-tack was placed, and the anterior gastric wall drawn up against the anterior abdominal wall. Percutaneous access was then obtained into the mid gastric body with an 18 gauge sheath needle.  Aspiration of air, and injection of contrast material under fluoroscopy confirmed needle placement.  An Amplatz wire was advanced in the gastric body and the access needle exchanged for a 9-French vascular sheath.  A snare device was advanced through the vascular sheath and an Amplatz wire advanced through the angled catheter.  The Amplatz wire was successfully snared and this was pulled up through the esophagus and out the mouth.  A 20-French Burnell Blanks MIC-PEG tube was then connected to the snare and pulled through the mouth, down the esophagus, into the stomach and out to the anterior abdominal wall. Hand injection of contrast material confirmed intragastric location. The T-tack retention suture was then cut. The pull through peg tube was then secured with the external bumper and capped.  The patient will be observed for several hours with the newly placed tube on low wall suction to evaluate for any post procedure complication.  The patient tolerated the procedure well, there is no immediate complication.  IMPRESSION:  Successful placement of a 20 French pull through gastrostomy tube.  The tube will be ready for use at noon on Friday gender 20 11/04/2012.  Signed,  Sterling Big, MD Vascular & Interventional Radiologist [facility] Radiology   Original Report Authenticated By: Malachy Moan, M.D.    Ir Pta Venous Right  08/08/2012  *RADIOLOGY REPORT*  Clinical data:  Occluded dialysis graft.  Previous declot and venous anastomotic PTA 11/16/2011.  DIALYSIS GRAFT DECLOT VENOUS ANGIOPLASTY ULTRASOUND GUIDANCE FOR VASCULAR ACCESS X2  Comparison: 11/16/2011  Technique: The procedure, risks (including but not limited to bleeding, infection, organ damage),  benefits, and alternatives were explained to the patient.  Questions regarding the procedure were encouraged and answered.  The patient understands and consents to the procedure.  Intravenous Fentanyl and Versed were administered as conscious sedation during continuous cardiorespiratory monitoring by the radiology RN, with a total moderate sedation time of 40 minutes.   The graft just central to the arterial anastomosis was accessed antegrade with a 21-gauge micropuncture needle under real-time ultrasonic guidance after the overlying skin prepped with Betadine, draped in usual sterile fashion, infiltrated locally with 1% lidocaine. Needle exchanged over 018 guidewire for transitional dilator through which 2 mg t-PA was administered. Ultrasound imaging documentation was saved. Through the antegrade dilator, a Bentson wire was advanced to the venous anastomosis. Over this a 80F sheath was placed, through which a 5 [place] Kumpe catheter was advanced for outflow venography. This showed patency of the outflow venous system through the SVC. 3000 units heparin were administered. The Arrow PTD device was used to macerate thrombus in the graft. Injection showed   clearance of thrombus from the graft.   In similar fashion, the more central aspect of the graft was accessed retrograde under ultrasound with a micropuncture needle, exchanged for a transitional dilator.The venous limb dilator was exchanged in similar fashion for a 6 French vascular sheath. The Kumpe catheter was advanced and used to gain access to the proximal brachial artery.  The Kumpe exchanged for a the Fogarty catheter, used to dislodge the platelet plug  into the   graft, where it was macerated. The Fogarty was used again to remove any residual platelet plug from the arterial anastomosis.  A follow shuntogram was performed, demonstrating good flow through the outflow vein, no extravasation.  Balloon angioplasty of the graft and venous anastomosis was  performed using a 7 mm x 4 cm Conquest angioplasty balloon with good response.  Balloon was removed and injection showed patency of the anastomosis, without extravasation or other apparent complication.  Reflux across the arterial anastomosis demonstrate this to be widely patent, with unremarkable appearance of the visualized native arterial circulation. The catheter and sheaths were then removed and hemostasis achieved with 2-0 Ethilon sutures. Patient tolerated procedure well.  IMPRESSION  1. Technically successful declot of right upper arm synthetic straight      hemodialysis graft.  2. Technically successful balloon angioplasty of venous anastomotic stenosis.  Access management: Remains approachable for percutaneous intervention as needed.   Original Report Authenticated By: D. Andria Rhein, MD    Ir Angio Av Shunt Addl Access  08/08/2012  *RADIOLOGY REPORT*  Clinical data:  Occluded dialysis graft.  Previous declot and venous anastomotic PTA 11/16/2011.  DIALYSIS GRAFT DECLOT VENOUS ANGIOPLASTY ULTRASOUND GUIDANCE FOR VASCULAR ACCESS X2  Comparison: 11/16/2011  Technique: The procedure, risks (including but not limited to bleeding, infection, organ damage), benefits, and alternatives were explained to the patient.  Questions regarding the procedure were encouraged and answered.  The patient understands and consents to the procedure.  Intravenous Fentanyl and Versed were administered as conscious sedation during continuous cardiorespiratory monitoring by the radiology RN, with a total moderate sedation time of 40 minutes.   The graft just central to the arterial anastomosis was accessed antegrade with a 21-gauge micropuncture  needle under real-time ultrasonic guidance after the overlying skin prepped with Betadine, draped in usual sterile fashion, infiltrated locally with 1% lidocaine. Needle exchanged over 018 guidewire for transitional dilator through which 2 mg t-PA was administered. Ultrasound imaging  documentation was saved. Through the antegrade dilator, a Bentson wire was advanced to the venous anastomosis. Over this a 58F sheath was placed, through which a 5 [place] Kumpe catheter was advanced for outflow venography. This showed patency of the outflow venous system through the SVC. 3000 units heparin were administered. The Arrow PTD device was used to macerate thrombus in the graft. Injection showed   clearance of thrombus from the graft.   In similar fashion, the more central aspect of the graft was accessed retrograde under ultrasound with a micropuncture needle, exchanged for a transitional dilator.The venous limb dilator was exchanged in similar fashion for a 6 French vascular sheath. The Kumpe catheter was advanced and used to gain access to the proximal brachial artery.  The Kumpe exchanged for a the Fogarty catheter, used to dislodge the platelet plug  into the   graft, where it was macerated. The Fogarty was used again to remove any residual platelet plug from the arterial anastomosis.  A follow shuntogram was performed, demonstrating good flow through the outflow vein, no extravasation.  Balloon angioplasty of the graft and venous anastomosis was performed using a 7 mm x 4 cm Conquest angioplasty balloon with good response.  Balloon was removed and injection showed patency of the anastomosis, without extravasation or other apparent complication.  Reflux across the arterial anastomosis demonstrate this to be widely patent, with unremarkable appearance of the visualized native arterial circulation. The catheter and sheaths were then removed and hemostasis achieved with 2-0 Ethilon sutures. Patient tolerated procedure well.  IMPRESSION  1. Technically successful declot of right upper arm synthetic straight      hemodialysis graft.  2. Technically successful balloon angioplasty of venous anastomotic stenosis.  Access management: Remains approachable for percutaneous intervention as needed.   Original  Report Authenticated By: D. Andria Rhein, MD    Ir US Guide Vasc Access Right  08/08/2012  *RADIOLOGY REPORT*  Clinical data:  Occluded dialysis graft.  Previous declot and venous anastomotic PTA 11/16/2011.  DIALYSIS GRAFT DECLOT VENOUS ANGIOPLASTY ULTRASOUND GUIDANCE FOR VASCULAR ACCESS X2  Comparison: 11/16/2011  Technique: The procedure, risks (including but not limited to bleeding, infection, organ damage), benefits, and alternatives were explained to the patient.  Questions regarding the procedure were encouraged and answered.  The patient understands and consents to the procedure.  Intravenous Fentanyl and Versed were administered as conscious sedation during continuous cardiorespiratory monitoring by the radiology RN, with a total moderate sedation time of 40 minutes.   The graft just central to the arterial anastomosis was accessed antegrade with a 21-gauge micropuncture needle under real-time ultrasonic guidance after the overlying skin prepped with Betadine, draped in usual sterile fashion, infiltrated locally with 1% lidocaine. Needle exchanged over 018 guidewire for transitional dilator through which 2 mg t-PA was administered. Ultrasound imaging documentation was saved. Through the antegrade dilator, a Bentson wire was advanced to the venous anastomosis. Over this a 58F sheath was placed, through which a 5 [place] Kumpe catheter was advanced for outflow venography. This showed patency of the outflow venous system through the SVC. 3000 units heparin were administered. The Arrow PTD device was used to macerate thrombus in the graft. Injection showed   clearance of thrombus from the graft.   In similar fashion, the  more central aspect of the graft was accessed retrograde under ultrasound with a micropuncture needle, exchanged for a transitional dilator.The venous limb dilator was exchanged in similar fashion for a 6 French vascular sheath. The Kumpe catheter was advanced and used to gain access to the  proximal brachial artery.  The Kumpe exchanged for a the Fogarty catheter, used to dislodge the platelet plug  into the   graft, where it was macerated. The Fogarty was used again to remove any residual platelet plug from the arterial anastomosis.  A follow shuntogram was performed, demonstrating good flow through the outflow vein, no extravasation.  Balloon angioplasty of the graft and venous anastomosis was performed using a 7 mm x 4 cm Conquest angioplasty balloon with good response.  Balloon was removed and injection showed patency of the anastomosis, without extravasation or other apparent complication.  Reflux across the arterial anastomosis demonstrate this to be widely patent, with unremarkable appearance of the visualized native arterial circulation. The catheter and sheaths were then removed and hemostasis achieved with 2-0 Ethilon sutures. Patient tolerated procedure well.  IMPRESSION  1. Technically successful declot of right upper arm synthetic straight      hemodialysis graft.  2. Technically successful balloon angioplasty of venous anastomotic stenosis.  Access management: Remains approachable for percutaneous intervention as needed.   Original Report Authenticated By: D. Andria Rhein, MD    Dg Chest Portable 1 View  08/02/2012  *RADIOLOGY REPORT*  Clinical Data: Facial droop.  PORTABLE CHEST - 1 VIEW  Comparison: 03/06/2012  Findings: Heart is upper limits normal in size.  No confluent airspace opacities or effusions.  Mild peribronchial thickening. No acute bony abnormality.  IMPRESSION: Stable mild peribronchial thickening.   Original Report Authenticated By: Charlett Nose, M.D.    Dg Abd Portable 1v  08/09/2012  *RADIOLOGY REPORT*  Clinical Data: Evaluate residual barium prior to G-tube placement  PORTABLE ABDOMEN - 1 VIEW  Comparison: KUB of 08/07/2012  Findings: The retained barium resides exclusively within the right colon.  An NG tube is present with the tip overlying the distal antrum of  the stomach.  The bowel gas pattern is nonspecific.  A moderate amount of feces is present throughout the colon.  The bones are diffusely osteopenic and there is a slight lumbar curvature convex to the left.  IMPRESSION: Retained barium is within the right colon exclusively.  Moderate amount of feces throughout the colon.   Original Report Authenticated By: Dwyane Dee, M.D.    Dg Abd Portable 1v  08/07/2012  *RADIOLOGY REPORT*  Clinical Data: Percutaneous gastrostomy tube placement  PORTABLE ABDOMEN - 1 VIEW  Comparison: Earlier same day; 08/06/2012  Findings:  Minimal transit of previously administered enteric contrast, now seen within the more distal small bowel.  Paucity of bowel gas without definite evidence of obstruction.  No supine evidence of pneumoperitoneum.  Enteric tube tip and sideport project over the expected location of the gastric fundus.  Limited visualization of the lower thorax demonstrates mild elevation of the right hemidiaphragm.  Mild multilevel lumbar spine degenerative change.  Osteopenia without definite fracture.  Vascular calcifications.  IMPRESSION: 1.  Minimal transit of previously ingested enteric contrast, now seen within the more distal small bowel, but has not yet reached the transverse colon.  2.  Nonobstructive bowel gas pattern.   Original Report Authenticated By: Tacey Ruiz, MD    Dg Abd Portable 1v  08/07/2012  *RADIOLOGY REPORT*  Clinical Data: Evaluate barium prior to potential gastrostomy tube placement  PORTABLE ABDOMEN - 1 VIEW  Comparison: 08/06/2012; CT abdomen pelvis - 12/09/2006  Findings:  Administered enteric contrast seen to the level of the mid/distal small bowel.  Evaluation of the bowel gas pattern is degraded secondary to patient motion artifact.  No definite evidence of obstruction. Evaluation for pneumoperitoneum is degraded secondary to patient motion artifact and exclusion of the lower thorax.  Osteopenia with suspected multilevel degenerative  change.  Vascular calcifications.  IMPRESSION: Enteric contrast seen to the level of the mid/distal small bowel. No definite evidence of obstruction.   Original Report Authenticated By: Tacey Ruiz, MD    Dg Abd Portable 1v  08/06/2012  *RADIOLOGY REPORT*  Clinical Data: 79 year old female NG tube placement.  PORTABLE ABDOMEN - 1 VIEW  Comparison: CT abdomen 12/09/2006.  Findings: Portable supine AP view 1720 hours.  NG tube in place, tip projects in the right upper quadrant, likely in the proximal duodenum.  Mild motion artifact. Nonobstructed bowel gas pattern. Grossly clear lung bases. Aorto iliac calcified atherosclerosis.  IMPRESSION: NG tube in place, tip at the level of the proximal duodenum.   Original Report Authenticated By: Erskine Speed, M.D.    Mr Mra Head/brain Wo Cm  08/03/2012  *RADIOLOGY REPORT*  Clinical Data:  Unresponsiveness associated with facial droop following hemodialysis.  Chronic renal failure.  MRI HEAD WITHOUT CONTRAST MRA HEAD WITHOUT CONTRAST  Technique:  Multiplanar, multiecho pulse sequences of the brain and surrounding structures were obtained without intravenous contrast. Angiographic images of the head were obtained using MRA technique without contrast.  Comparison:  Most recent CT head 08/02/2012.  MRI HEAD  Findings:  The patient had difficulty remaining motionless for the study.  Images are suboptimal.  Small or subtle lesions could be overlooked.  There is a subcentimeter infarct affecting the left frontal white matter in a subcortical location, approximately 5 mm in diameter. No other areas of acute infarction are seen.  There is no   intracranial hemorrhage, mass lesion, hydrocephalus, or extra-axial fluid. There is advanced atrophy and extensive chronic microvascular ischemic change.  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  Tiny focus chronic hemorrhage left posterior temporal region.  Remote brainstem lacunar infarcts.   Remote left basal ganglia lacunar infarcts.  Extensive dural calcification. No scalp or osseous lesions.  No gross midline abnormality.  Chronic sinus disease.  No acute mastoid fluid. Compared with prior CT, this infarct is not visible.  IMPRESSION: Subcentimeter left frontal subcortical white matter infarct, approximately 5 mm in diameter.  No hemorrhage.  Advanced atrophy and extensive chronic microvascular ischemic change.  Remote ischemic events are stable.  MRA HEAD  Findings: There is a 50-75% stenosis of the supraclinoid right ICA. The left internal carotid artery shows mild nonstenotic irregularity in its supraclinoid segment.  Long segment 75-90% stenosis proximal right anterior cerebral artery.  Both distal ACAs fill from the left.  Mild nonstenotic irregularity of the proximal middle cerebral arteries bilaterally. Poor flow related enhancement of the distal MCA branches on the right is noted, reflecting previous right hemisphere ischemic event.  The basilar artery is widely patent with left vertebral dominant. Hypoplasia of the distal right vertebral relates to primarily supply of this vessel of the PICA.  There is mild irregularity of the distal PCA without proximal narrowing.  Cerebellar branches are poorly seen.  There is a 3 mm aneurysm projecting anteriorly from the right internal carotid artery at the ophthalmic origin.  A tiny inferior outpouching from the  supraclinoid ICA on the left, no more than 1 mm in size (image 73 series 5) could be atherosclerotic in nature, or arise from a paraophthalmic location.  IMPRESSION: Potentially flow reducing lesion supraclinoid internal carotid artery on the right, estimated 50-75% stenosis.Widespread intracranial atherosclerotic disease elsewhere.  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   Original Report Authenticated By: Davonna Belling, M.D.    Ir Declot Right Mod Sed  08/08/2012   *RADIOLOGY REPORT*  Clinical data:  Occluded dialysis graft.  Previous declot and venous anastomotic PTA 11/16/2011.  DIALYSIS GRAFT DECLOT VENOUS ANGIOPLASTY ULTRASOUND GUIDANCE FOR VASCULAR ACCESS X2  Comparison: 11/16/2011  Technique: The procedure, risks (including but not limited to bleeding, infection, organ damage), benefits, and alternatives were explained to the patient.  Questions regarding the procedure were encouraged and answered.  The patient understands and consents to the procedure.  Intravenous Fentanyl and Versed were administered as conscious sedation during continuous cardiorespiratory monitoring by the radiology RN, with a total moderate sedation time of 40 minutes.   The graft just central to the arterial anastomosis was accessed antegrade with a 21-gauge micropuncture needle under real-time ultrasonic guidance after the overlying skin prepped with Betadine, draped in usual sterile fashion, infiltrated locally with 1% lidocaine. Needle exchanged over 018 guidewire for transitional dilator through which 2 mg t-PA was administered. Ultrasound imaging documentation was saved. Through the antegrade dilator, a Bentson wire was advanced to the venous anastomosis. Over this a 62F sheath was placed, through which a 5 [place] Kumpe catheter was advanced for outflow venography. This showed patency of the outflow venous system through the SVC. 3000 units heparin were administered. The Arrow PTD device was used to macerate thrombus in the graft. Injection showed   clearance of thrombus from the graft.   In similar fashion, the more central aspect of the graft was accessed retrograde under ultrasound with a micropuncture needle, exchanged for a transitional dilator.The venous limb dilator was exchanged in similar fashion for a 6 French vascular sheath. The Kumpe catheter was advanced and used to gain access to the proximal brachial artery.  The Kumpe exchanged for a the Fogarty catheter, used to dislodge the  platelet plug  into the   graft, where it was macerated. The Fogarty was used again to remove any residual platelet plug from the arterial anastomosis.  A follow shuntogram was performed, demonstrating good flow through the outflow vein, no extravasation.  Balloon angioplasty of the graft and venous anastomosis was performed using a 7 mm x 4 cm Conquest angioplasty balloon with good response.  Balloon was removed and injection showed patency of the anastomosis, without extravasation or other apparent complication.  Reflux across the arterial anastomosis demonstrate this to be widely patent, with unremarkable appearance of the visualized native arterial circulation. The catheter and sheaths were then removed and hemostasis achieved with 2-0 Ethilon sutures. Patient tolerated procedure well.  IMPRESSION  1. Technically successful declot of right upper arm synthetic straight      hemodialysis graft.  2. Technically successful balloon angioplasty of venous anastomotic stenosis.  Access management: Remains approachable for percutaneous intervention as needed.   Original Report Authenticated By: D. Andria Rhein, MD    Ir Radiologist Eval & Mgmt  08/08/2012  *RADIOLOGY REPORT*  Clinical data:  Occluded dialysis graft.  Previous declot and venous anastomotic PTA 11/16/2011.  DIALYSIS GRAFT DECLOT VENOUS ANGIOPLASTY ULTRASOUND GUIDANCE FOR VASCULAR ACCESS X2  Comparison: 11/16/2011  Technique:  The procedure, risks (including but not limited to bleeding, infection, organ damage), benefits, and alternatives were explained to the patient.  Questions regarding the procedure were encouraged and answered.  The patient understands and consents to the procedure.  Intravenous Fentanyl and Versed were administered as conscious sedation during continuous cardiorespiratory monitoring by the radiology RN, with a total moderate sedation time of 40 minutes.   The graft just central to the arterial anastomosis was accessed antegrade with  a 21-gauge micropuncture needle under real-time ultrasonic guidance after the overlying skin prepped with Betadine, draped in usual sterile fashion, infiltrated locally with 1% lidocaine. Needle exchanged over 018 guidewire for transitional dilator through which 2 mg t-PA was administered. Ultrasound imaging documentation was saved. Through the antegrade dilator, a Bentson wire was advanced to the venous anastomosis. Over this a 75F sheath was placed, through which a 5 [place] Kumpe catheter was advanced for outflow venography. This showed patency of the outflow venous system through the SVC. 3000 units heparin were administered. The Arrow PTD device was used to macerate thrombus in the graft. Injection showed   clearance of thrombus from the graft.   In similar fashion, the more central aspect of the graft was accessed retrograde under ultrasound with a micropuncture needle, exchanged for a transitional dilator.The venous limb dilator was exchanged in similar fashion for a 6 French vascular sheath. The Kumpe catheter was advanced and used to gain access to the proximal brachial artery.  The Kumpe exchanged for a the Fogarty catheter, used to dislodge the platelet plug  into the   graft, where it was macerated. The Fogarty was used again to remove any residual platelet plug from the arterial anastomosis.  A follow shuntogram was performed, demonstrating good flow through the outflow vein, no extravasation.  Balloon angioplasty of the graft and venous anastomosis was performed using a 7 mm x 4 cm Conquest angioplasty balloon with good response.  Balloon was removed and injection showed patency of the anastomosis, without extravasation or other apparent complication.  Reflux across the arterial anastomosis demonstrate this to be widely patent, with unremarkable appearance of the visualized native arterial circulation. The catheter and sheaths were then removed and hemostasis achieved with 2-0 Ethilon sutures. Patient  tolerated procedure well.  IMPRESSION  1. Technically successful declot of right upper arm synthetic straight      hemodialysis graft.  2. Technically successful balloon angioplasty of venous anastomotic stenosis.  Access management: Remains approachable for percutaneous intervention as needed.   Original Report Authenticated By: D. Andria Rhein, MD     Microbiology: No results found for this or any previous visit (from the past 240 hour(s)).   Labs: Basic Metabolic Panel:  Lab 08/11/12 4098 08/10/12 0610 08/09/12 0855 08/08/12 0633  NA 141 144 144 144  K 3.9 4.3 4.3 3.9  CL 102 104 102 105  CO2 26 26 27 28   GLUCOSE 309* 105* 118* 203*  BUN 40* 21 13 27*  CREATININE 4.42* 3.45* 2.41* 4.67*  CALCIUM 8.0* 8.4 8.5 8.4  MG -- -- -- --  PHOS -- -- -- 4.4   Liver Function Tests:  Lab 08/08/12 0633  AST --  ALT --  ALKPHOS --  BILITOT --  PROT --  ALBUMIN 2.1*   No results found for this basename: LIPASE:5,AMYLASE:5 in the last 168 hours No results found for this basename: AMMONIA:5 in the last 168 hours CBC:  Lab 08/11/12 0629 08/10/12 0610 08/09/12 0855 08/08/12 0633  WBC 11.7* 11.0* 13.1*  7.2  NEUTROABS -- -- -- --  HGB 10.8* 10.5* 11.2* 7.1*  HCT 35.4* 33.8* 35.5* 23.7*  MCV 90.3 89.4 88.5 87.8  PLT 182 170 191 241   Cardiac Enzymes: No results found for this basename: CKTOTAL:5,CKMB:5,CKMBINDEX:5,TROPONINI:5 in the last 168 hours BNP: BNP (last 3 results)  Basename 09/14/11 1223  PROBNP 8852.0*   CBG:  Lab 08/14/12 0713 08/13/12 1210 08/13/12 0633 08/12/12 1654 08/11/12 2024  GLUCAP 326* 250* 236* 192* 190*       Signed:  Bridgit Eynon A  [facility] 08/14/2012, 9:44 AM

## 2012-08-14 NOTE — Procedures (Signed)
Patient was seen on dialysis and the procedure was supervised.  BFR 350  Via AVG BP is  107/54.   Patient appears to be tolerating treatment well  Linda [name] A 08/14/2012

## 2012-08-14 NOTE — Progress Notes (Signed)
NCM spoke to dtr, and states she does prefer [facility] but she will discuss with family about [facility]. And make NCM aware in am. Isidoro Donning RN CCM Case Mgmt phone [phone]

## 2012-08-14 NOTE — Clinical Social Work Note (Signed)
Clinical Social Work   Pt is ready for discharge and has been approved for [facility]. RNCM is following for LTAC placement.   CSW updated [facility]. CSW is signing off as no further needs identified.   Dede Query, MSW, Theresia Majors [phone]

## 2012-08-14 NOTE — Progress Notes (Signed)
[facility] PROGRESS NOTE  Linda [name] ZOX:[id number] DOB: February 22, 1931 DOA: 08/02/2012 PCP: Colon Branch, MD Brief Narrative: This is a 79 year old female nursing facility resident, with known history of ESRD on HD T-T-S, chronic anemia, DM, dyslipidemia, secondary hyperparathyroidism, HTN, PVD, s/p bilateral BKA, GERD, sacral decubitus s/p recent debridement,s/p recent hospitalization at [facility], for pyelonephritis, treated with Tobramycin, then discharged on Vancomycin with HD. Apparently, patient is s/p HD on 08/02/12, which she completed without incident, then en route to the nursing facility, she had an episode of unresponsiveness, associated with facial drooping, lasting for about 20 mins, before patient arrived at the nursing facility. She was then brought to the ED. Per ED MD, patient on arrival, was asymptomatic and at baseline mental status. She was admitted for further evaluation and management.   Assessment/Plan: Altered mental status/CVA Patient presented with what was described as an episode of very poor responsiveness, associated with facial droop, following HD. Clinically, she does have some weakness of left upper extremity. Head CT scan revealed an infarct within the right parietal lobe which may be subacute to chronic. Patient reportedly, was last seen normal at 2:15 PM on 08/02/12, so she was outside the window for t-PA, and symptoms had improved on arrival, although her other co-morbidities, including a moderate anemia, may also have been contraindications. Dr. Wyatt Portela provided neurology consultation and CVA w/u, carried out. Brain MRI showed subcentimeter left frontal subcortical white matter infarct, approximately 5 mm in diameter. No hemorrhage. Advanced atrophy and extensive chronic microvascular ischemic change. Remote ischemic events are stable. MRA revealed potentially flow reducing lesion supraclinoid internal carotid artery on the right, estimated  50-75% stenosis. Widespread intracranial atherosclerotic disease elsewhere. 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 Carotid doppler showed no evidence of hemodynamically significant internal carotid artery stenosis. Vertebral artery flow is antegrade. 2D Echocardiogram revealed normal LV cavity size, mild LVH and EF of 65% to 70%. Continued on anti-platelet medication. EKG shows SR, with old Q-waves in V1-V2, but no acute ischemic changes. SLP has evaluated and recommended Dysphagia 1 with pudding thick consistency.  Due to failure to thrive, PEG was placed on 08/09/2012 for nutrition.  ESRD (end stage renal disease) Patient is on HD T-T-S, and is s/p hemodialysis on 08/02/12. Dr Delano Metz provided nephrology consultation and regular dialysis schedule has been reinstated. Patient was found on 08/07/12, patient to have a clotted AV-fistula and had declotting performed on 08/08/2012.  Discussed with Dr. Briant Cedar due to patient's decubitus ulcer may have difficulty sitting for outpatient dialysis. Had HD on 08/11/2012. Likely benefit from [facility] for wound care and hemodialysis.  Diabetes mellitus, type 2 Patient has insulin-requiring type 2 diabetes mellitus. CBG was 66 on presentation, but came up to 80, while in the ED. Hypoglycemia resolved. Now on SSI. Increase levemir to 10 units SQ qhs. CBGs stable.  HTN (hypertension) Better controlled. On amlodipine 2.5 mg daily. Clonidine patch 0.2 mg. Further titration as outpatient.  Anemia Normocytic and due to chronic disease and ESRD. Patient transfused 2 units of pRBC on 08/08/2012. Hemoglobin stable.  Sacral decubitus ulcer Patient has a rather large, approximately 8 cm x 9 cm stage 4 sacral decubitus, and is reportedly, s/p recent debridement. Wound shows evidence of granulation, and does not appear overly infected. Utilizing mattress overlay. Patient has been evaluated by [facility],  has undergone further sharp debridement on 08/04/12, as well as hydrotherapy. Managing as recommended.   Recent pyelonephritis  Patient is reportedly, s/p recent hospitalization at [facility], for a pyelonephritis, for which she was treated with Tobramycin, then Vancomycin. She is afebrile, and does not look toxic. Repeat urinalysis revealed pyuria, but culture showed no growth.  Failure to thrive  It appears that patient has had FTT and poor oral intake. Dr. Brien Few has discussed with daughter on 08/04/12, and after conferring with family, they have elected to proceed. PEG placed on 08/09/2012 and started on tube feeds on 08/10/2012.  Patient and family had a meeting with palliative care, family changed code status to DNR, but wanted to continue medical management including HD.  Code Status: DNR/DNI. Family Communication: No family at bedside.  Disposition Plan:  DC to LTAC if bed is available.  Consultants:  Dr. Leroy Kennedy, neurology  Dr. Dayle Points. Briant Cedar, renal  Palliative care  Procedures:  Head CT  CXR  Antibiotics:  None.  HPI/Subjective: Awake, mumbling.  Objective: Filed Vitals:   08/13/12 1503 08/13/12 2048 08/14/12 0200 08/14/12 0700  BP: 145/80 136/78 155/58 171/60  Pulse: 83 81 85 98  Temp: 98 F (36.7 C) 98.1 F (36.7 C) 98.2 F (36.8 C) 98 F (36.7 C)  TempSrc: Oral Oral Oral Oral  Resp: 20 18 18 20   Height:      Weight:      SpO2: 98% 100% 100% 100%    Intake/Output Summary (Last 24 hours) at 08/14/12 0932 Last data filed at 08/14/12 0500  Gross per 24 hour  Intake      0 ml  Output    100 ml  Net   -100 ml   Filed Weights   08/08/12 1334 08/11/12 1335 08/11/12 1831  Weight: 43.4 kg (95 lb 10.9 oz) 43.4 kg (95 lb 10.9 oz) 41.8 kg (92 lb 2.4 oz)    Exam: Physical Exam: General: Awake, No acute distress. HEENT: EOMI. Neck: Supple CV: S1 and S2 Lungs: Clear to ascultation bilaterally Abdomen: Soft, Nontender, Nondistended, +bowel  sounds. Ext: Good pulses. Trace edema. Back: Decubitus ulcer, sharp margins, no signs of infection.  Data Reviewed: Basic Metabolic Panel:  Lab 08/11/12 1610 08/10/12 0610 08/09/12 0855 08/08/12 0633  NA 141 144 144 144  K 3.9 4.3 4.3 3.9  CL 102 104 102 105  CO2 26 26 27 28   GLUCOSE 309* 105* 118* 203*  BUN 40* 21 13 27*  CREATININE 4.42* 3.45* 2.41* 4.67*  CALCIUM 8.0* 8.4 8.5 8.4  MG -- -- -- --  PHOS -- -- -- 4.4   Liver Function Tests:  Lab 08/08/12 0633  AST --  ALT --  ALKPHOS --  BILITOT --  PROT --  ALBUMIN 2.1*   No results found for this basename: LIPASE:5,AMYLASE:5 in the last 168 hours No results found for this basename: AMMONIA:5 in the last 168 hours CBC:  Lab 08/11/12 0629 08/10/12 0610 08/09/12 0855 08/08/12 0633  WBC 11.7* 11.0* 13.1* 7.2  NEUTROABS -- -- -- --  HGB 10.8* 10.5* 11.2* 7.1*  HCT 35.4* 33.8* 35.5* 23.7*  MCV 90.3 89.4 88.5 87.8  PLT 182 170 191 241   Cardiac Enzymes: No results found for this basename: CKTOTAL:5,CKMB:5,CKMBINDEX:5,TROPONINI:5 in the last 168 hours BNP (last 3 results)  Basename 09/14/11 1223  PROBNP 8852.0*   CBG:  Lab 08/14/12 0713 08/13/12 1210 08/13/12 0633 08/12/12 1654 08/11/12 2024  GLUCAP 326* 250* 236* 192* 190*    No results found for this or any previous visit (from the past 240 hour(s)).   Studies: No results found.  Scheduled Meds:    . amLODipine  2.5 mg Oral Daily  . aspirin  300 mg Rectal Daily   Or  . aspirin  325 mg Oral Daily  . cloNIDine  0.2 mg Transdermal Weekly  . collagenase   Topical Daily  . darbepoetin (ARANESP) injection - DIALYSIS  100 mcg Intravenous Q Sat-HD  . doxercalciferol  0.5 mcg Intravenous Q T,Th,Sa-HD  . famotidine  20 mg Oral Daily  . feeding supplement (NEPRO CARB STEADY)  474 mL Per Tube BID  . free water  100 mL Per Tube Q6H  . heparin  5,000 Units Subcutaneous Q8H  . insulin aspart  0-9 Units Subcutaneous TID WC  . insulin detemir  5 Units  Subcutaneous QHS  . iron polysaccharides  150 mg Oral Daily  . mirtazapine  15 mg Oral QHS  . PARoxetine  20 mg Oral q morning - 10a  . simvastatin  10 mg Oral q1800  . sodium chloride  3 mL Intravenous Q12H   Continuous Infusions:    Active Problems:  Altered mental status  Syncope  ESRD (end stage renal disease)  Diabetes mellitus, type 2  HTN (hypertension)  Anemia  Sacral decubitus ulcer  FTT (failure to thrive) in adult  CVA (cerebral infarction)  Adult failure to thrive  Pain, generalized    Lameka Disla A, MD  [facility] Pager [phone]. If 7PM-7AM, please contact night-coverage at [email or web address], password [facility] 08/14/2012, 9:32 AM  LOS: 12 days

## 2012-08-14 NOTE — Progress Notes (Addendum)
Pt. B/P dropped sharply from 116 Systolic to 75 systolic approximately 10 minutes into tx. Pt. Vomited large amount. NS bolus 200 given, UF off. Pt B/P returned to 107 systolic. Pt. Not tolerating fluid removal. UF turned off.  Blood flow turned down to 350 due to decreased arterial pressure. Dr. Kathrene Bongo notified, no new orders

## 2012-08-14 NOTE — Progress Notes (Signed)
Subjective:   Very weak in bed, speech not strong unable to understand.  Palliative care is working with family she is now DNR but we are to continue HD.  Apparently plan is to transfer to [facility] today.  She needs her HD before she is transferred Objective Vital signs in last 24 hours: Filed Vitals:   08/13/12 1503 08/13/12 2048 08/14/12 0200 08/14/12 0700  BP: 145/80 136/78 155/58 171/60  Pulse: 83 81 85 98  Temp: 98 F (36.7 C) 98.1 F (36.7 C) 98.2 F (36.8 C) 98 F (36.7 C)  TempSrc: Oral Oral Oral Oral  Resp: 20 18 18 20   Height:      Weight:      SpO2: 98% 100% 100% 100%   Weight change:   Intake/Output Summary (Last 24 hours) at 08/14/12 1219 Last data filed at 08/14/12 0500  Gross per 24 hour  Intake      0 ml  Output    100 ml  Net   -100 ml   Labs: Basic Metabolic Panel:  Lab 08/11/12 4782 08/10/12 0610 08/09/12 0855 08/08/12 0633  NA 141 144 144 --  K 3.9 4.3 4.3 --  CL 102 104 102 --  CO2 26 26 27  --  GLUCOSE 309* 105* 118* --  BUN 40* 21 13 --  CREATININE 4.42* 3.45* 2.41* --  CALCIUM 8.0* 8.4 8.5 --  ALB -- -- -- --  PHOS -- -- -- 4.4   Liver Function Tests:  Lab 08/08/12 0633  AST --  ALT --  ALKPHOS --  BILITOT --  PROT --  ALBUMIN 2.1*   No results found for this basename: LIPASE:3,AMYLASE:3 in the last 168 hours No results found for this basename: AMMONIA:3 in the last 168 hours CBC:  Lab 08/11/12 0629 08/10/12 0610 08/09/12 0855 08/08/12 0633  WBC 11.7* 11.0* 13.1* --  NEUTROABS -- -- -- --  HGB 10.8* 10.5* 11.2* --  HCT 35.4* 33.8* 35.5* --  MCV 90.3 89.4 88.5 87.8  PLT 182 170 191 --   Cardiac Enzymes: No results found for this basename: CKTOTAL:5,CKMB:5,CKMBINDEX:5,TROPONINI:5 in the last 168 hours CBG:  Lab 08/14/12 1146 08/14/12 0713 08/13/12 1210 08/13/12 0633 08/12/12 1654  GLUCAP 329* 326* 250* 236* 192*    Iron Studies: No results found for this basename: IRON,TIBC,TRANSFERRIN,FERRITIN in the last 72  hours Studies/Results: No results found. Medications: Infusions:    Scheduled Medications:    . amLODipine  2.5 mg Oral Daily  . aspirin  300 mg Rectal Daily   Or  . aspirin  325 mg Oral Daily  . cloNIDine  0.2 mg Transdermal Weekly  . collagenase   Topical Daily  . darbepoetin (ARANESP) injection - DIALYSIS  100 mcg Intravenous Q Sat-HD  . doxercalciferol  0.5 mcg Intravenous Q T,Th,Sa-HD  . famotidine  20 mg Oral Daily  . feeding supplement (NEPRO CARB STEADY)  474 mL Per Tube BID  . free water  100 mL Per Tube Q6H  . heparin  5,000 Units Subcutaneous Q8H  . insulin aspart  0-9 Units Subcutaneous TID WC  . insulin detemir  10 Units Subcutaneous QHS  . iron polysaccharides  150 mg Oral Daily  . mirtazapine  15 mg Oral QHS  . PARoxetine  20 mg Oral q morning - 10a  . simvastatin  10 mg Oral q1800  . sodium chloride  3 mL Intravenous Q12H    have reviewed scheduled and prn medications.  Physical Exam: General: thin, weak, garbled  speech Heart: RRR Lungs: mostly clear Abdomen: soft, non tender Extremities:minimal edema Dialysis Access: R AVF, good thrill and bruit   I Assessment/ Plan: Pt is a 79 y.o. yo female with ESRD who was admitted on 08/02/2012 with  CVA but also has many comorbids including stage 4 decub and essentially bed bound status right now  Assessment/Plan: 1. S/p CVA- slow progress in all rehab areas 2. ESRD - have been continuing HD every TTS via AVF however, not sure patient is a candidate for continued OP HD due to immobility.  Agree with continued conversations between palliative care and family to determine disposition.   3. Anemia- hgb 10.8, on aranesp, continue 4. Secondary hyperparathyroidism- calcium and phos controlled.  On vitamin D 5. HTN/volume- BP up and down. UF as able with HD, gets low BP with HD 6. Dispo- I agree may not be able to return to her previous arrangement, if cant sit up in chair for HD will need LTAC.  I see plans for  transfer to LTAC after HD today.    Joory Gough A   08/14/2012,12:19 PM  LOS: 12 days

## 2012-08-15 LAB — GLUCOSE, CAPILLARY
Glucose-Capillary: 274 mg/dL — ABNORMAL HIGH (ref 70–99)
Glucose-Capillary: 183 mg/dL — ABNORMAL HIGH (ref 70–99)
Glucose-Capillary: 232 mg/dL — ABNORMAL HIGH (ref 70–99)
Glucose-Capillary: 209 mg/dL — ABNORMAL HIGH (ref 70–99)

## 2012-08-15 NOTE — Progress Notes (Signed)
PT HYDROTHERAPY PROGRESS NOTE  08/15/12 0900  Subjective Assessment  Subjective Pt with no moaning or verbilizations in response to treatment today. other than opening of her eyes in response to stimulus.  Date of Onset (prior to admission)  Evaluation and Treatment  Evaluation and Treatment Procedures Explained to Patient/Family Yes  Evaluation and Treatment Procedures Patient unable to consent due to mental status  Wound 08/02/12 Other (Comment) Back Mid;Lower  Date First Assessed/Time First Assessed: 08/02/12 1820   Wound Type: (c) Other (Comment)  Location: Back  Location Orientation: Mid;Lower  Present on Admission: Yes  Site / Wound Assessment Pink;Red;Yellow  % Wound base Red or Granulating 70%  % Wound base Yellow 20%  % Wound base Black 5% (less than)  % Wound base Other (Comment) 5% (greater than)  Peri-wound Assessment Pink;Erythema (blanchable)  Margins Unattacted edges (unapproximated)  Closure None  Drainage Amount Moderate  Drainage Description Serosanguineous  Non-staged Wound Description Full thickness  Treatment Hydrotherapy (Pulse lavage);Packing (Saline gauze);Tape changed  Dressing Type ABD;Other (Comment);Barrier Film (skin prep);Gauze (Comment)  Dressing Changed Changed  Dressing Status Dry;Intact  Hydrotherapy  Pulsed Lavage with Suction (psi) 4 psi  Pulsed Lavage with Suction - Normal Saline Used 1000 mL  Pulsed Lavage Tip Tip with splash shield  Pulsed lavage therapy - wound location Sacral wound  Selective Debridement  Selective Debridement - Location sacral wound  Selective Debridement - Tools Used Forceps;Scalpel;Scissors  Selective Debridement - Tissue Removed larger area of black removed at 9 oclock  Wound Therapy - Assess/Plan/Recommendations  Wound Therapy - Clinical Statement Hydrotherapy performed as indicated. Patient much less responsive today in comparison to prior sessions. Wound continues to make progress in healing.  Applied Santyl  ointment with dressing today to facilitate chemical debridement of unviable tissue.  Will cotninue to perform hydrotherapy as indicated.  Wound Therapy - Functional Problem List decreased skin integrity, decreased functional mobility  Factors Delaying/Impairing Wound Healing Altered sensation;Diabetes Mellitus;Multiple medical problems;Vascular compromise;Infection - systemic/local  Hydrotherapy Plan Debridement;Patient/family education;Pulsatile lavage with suction  Wound Therapy - Frequency 3X / week  Wound Plan Continue to see as indicated  Wound Therapy Goals - Improve the function of patient's integumentary system by progressing the wound(s) through the phases of wound healing by:  Decrease Necrotic Tissue to <10%  Decrease Necrotic Tissue - Progress Progressing toward goal  Increase Granulation Tissue to >90%  Increase Granulation Tissue - Progress Progressing toward goal  Improve Drainage Characteristics Min  Improve Drainage Characteristics - Progress Not progressing  Time For Goal Achievement 7 days  Wound Therapy - Potential for Goals Poor     Charlotte Crumb, PT DPT  [phone]

## 2012-08-15 NOTE — Progress Notes (Signed)
[facility] PROGRESS NOTE  Linda [name] ZOX:[id number] DOB: [birth year] DOA: 08/02/2012 PCP: Colon Branch, MD Brief Narrative: This is a 79 year old female nursing facility resident, with known history of ESRD on HD T-T-S, chronic anemia, DM, dyslipidemia, secondary hyperparathyroidism, HTN, PVD, s/p bilateral BKA, GERD, sacral decubitus s/p recent debridement,s/p recent hospitalization at [facility], for pyelonephritis, treated with Tobramycin, then discharged on Vancomycin with HD. Apparently, patient is s/p HD on 08/02/12, which she completed without incident, then en route to the nursing facility, she had an episode of unresponsiveness, associated with facial drooping, lasting for about 20 mins, before patient arrived at the nursing facility. She was then brought to the ED. Per ED MD, patient on arrival, was asymptomatic and at baseline mental status. She was admitted for further evaluation and management.   Assessment/Plan: Altered mental status/CVA Patient presented with what was described as an episode of very poor responsiveness, associated with facial droop, following HD. Clinically, she does have some weakness of left upper extremity. Head CT scan revealed an infarct within the right parietal lobe which may be subacute to chronic. Patient reportedly, was last seen normal at 2:15 PM on 08/02/12, so she was outside the window for t-PA, and symptoms had improved on arrival, although her other co-morbidities, including a moderate anemia, may also have been contraindications. Dr. Wyatt Portela provided neurology consultation and CVA w/u, carried out. Brain MRI showed subcentimeter left frontal subcortical white matter infarct, approximately 5 mm in diameter. No hemorrhage. Advanced atrophy and extensive chronic microvascular ischemic change. Remote ischemic events are stable. MRA revealed potentially flow reducing lesion supraclinoid internal carotid artery on the right, estimated  50-75% stenosis. Widespread intracranial atherosclerotic disease elsewhere. 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 Carotid doppler showed no evidence of hemodynamically significant internal carotid artery stenosis. Vertebral artery flow is antegrade. 2D Echocardiogram revealed normal LV cavity size, mild LVH and EF of 65% to 70%. Continued on anti-platelet medication. EKG shows SR, with old Q-waves in V1-V2, but no acute ischemic changes. SLP has evaluated and recommended Dysphagia 1 with pudding thick consistency.  Due to failure to thrive, PEG was placed on 08/09/2012 for nutrition.  ESRD (end stage renal disease) Patient is on HD T-T-S, and is s/p hemodialysis on 08/02/12. Dr Delano Metz provided nephrology consultation and regular dialysis schedule has been reinstated. Patient was found on 08/07/12, patient to have a clotted AV-fistula and had declotting performed on 08/08/2012.  Discussed with Dr. Briant Cedar due to patient's decubitus ulcer may have difficulty sitting for outpatient dialysis. Had HD on 08/11/2012. Likely benefit from [facility] for wound care and hemodialysis.  Diabetes mellitus, type 2 Patient has insulin-requiring type 2 diabetes mellitus. CBG was 66 on presentation, but came up to 80, while in the ED. Hypoglycemia resolved. Now on SSI. Increase levemir to 10 units SQ qhs. CBGs stable.  HTN (hypertension) Better controlled. On amlodipine 2.5 mg daily. Clonidine patch 0.2 mg. Further titration as outpatient.  Anemia Normocytic and due to chronic disease and ESRD. Patient transfused 2 units of pRBC on 08/08/2012. Hemoglobin stable.  Sacral decubitus ulcer Patient has a rather large, approximately 8 cm x 9 cm stage 4 sacral decubitus, and is reportedly, s/p recent debridement. Wound shows evidence of granulation, and does not appear overly infected. Utilizing mattress overlay. Patient has been evaluated by [facility],  has undergone further sharp debridement on 08/04/12, as well as hydrotherapy. Managing as recommended.   Recent pyelonephritis  Patient is reportedly, s/p recent hospitalization at [facility], for a pyelonephritis, for which she was treated with Tobramycin, then Vancomycin. She is afebrile, and does not look toxic. Repeat urinalysis revealed pyuria, but culture showed no growth.  Failure to thrive  It appears that patient has had FTT and poor oral intake. Dr. Brien Few has discussed with daughter on 08/04/12, and after conferring with family, they have elected to proceed. PEG placed on 08/09/2012 and started on tube feeds on 08/10/2012.  Patient and family had a meeting with palliative care, family changed code status to DNR, but wanted to continue medical management including HD.  Code Status: DNR/DNI. Family Communication: No family at bedside.  Disposition Plan:  DC to LTAC when bed available  Consultants:  Dr. Leroy Kennedy, neurology  Dr. Dayle Points. Briant Cedar, renal  Palliative care  Procedures:  Head CT  CXR  Antibiotics:  None.  HPI/Subjective: Patient mumbling. Objective: Filed Vitals:   08/14/12 1852 08/14/12 2220 08/15/12 0610 08/15/12 1109  BP: 162/84 165/54 167/52 135/67  Pulse: 81 86 82 93  Temp: 97.2 F (36.2 C) 98.2 F (36.8 C) 98.6 F (37 C) 98.3 F (36.8 C)  TempSrc: Oral Axillary Axillary Axillary  Resp: 18 18 18 18   Height:      Weight: 42.7 kg (94 lb 2.2 oz)     SpO2: 93% 100% 100% 93%    Intake/Output Summary (Last 24 hours) at 08/15/12 1455 Last data filed at 08/15/12 1451  Gross per 24 hour  Intake    100 ml  Output   -811 ml  Net    911 ml   Filed Weights   08/11/12 1831 08/14/12 1445 08/14/12 1852  Weight: 41.8 kg (92 lb 2.4 oz) 43.4 kg (95 lb 10.9 oz) 42.7 kg (94 lb 2.2 oz)    Exam: Physical Exam: General: Awake, No acute distress. HEENT: EOMI. Neck: Supple CV: S1 and S2 Lungs: Clear to ascultation bilaterally Abdomen: Soft,  Nontender, Nondistended, +bowel sounds. Ext: Good pulses. Trace edema. Back: Decubitus ulcer, sharp margins, no signs of infection.  Data Reviewed: Basic Metabolic Panel:  Lab 08/14/12 1610 08/11/12 0629 08/10/12 0610 08/09/12 0855  NA 132* 141 144 144  K 3.5 3.9 4.3 4.3  CL 93* 102 104 102  CO2 28 26 26 27   GLUCOSE 391* 309* 105* 118*  BUN 65* 40* 21 13  CREATININE 4.41* 4.42* 3.45* 2.41*  CALCIUM 8.1* 8.0* 8.4 8.5  MG -- -- -- --  PHOS 1.5* -- -- --   Liver Function Tests:  Lab 08/14/12 1448  AST --  ALT --  ALKPHOS --  BILITOT --  PROT --  ALBUMIN 1.7*   No results found for this basename: LIPASE:5,AMYLASE:5 in the last 168 hours No results found for this basename: AMMONIA:5 in the last 168 hours CBC:  Lab 08/14/12 1448 08/11/12 0629 08/10/12 0610 08/09/12 0855  WBC 11.0* 11.7* 11.0* 13.1*  NEUTROABS -- -- -- --  HGB 10.7* 10.8* 10.5* 11.2*  HCT 34.6* 35.4* 33.8* 35.5*  MCV 91.8 90.3 89.4 88.5  PLT 235 182 170 191   Cardiac Enzymes: No results found for this basename: CKTOTAL:5,CKMB:5,CKMBINDEX:5,TROPONINI:5 in the last 168 hours BNP (last 3 results)  Basename 09/14/11 1223  PROBNP 8852.0*   CBG:  Lab 08/15/12 1142 08/15/12 0756 08/14/12 2217 08/14/12 1146 08/14/12 0713  GLUCAP 209* 109* 110* 329* 326*    No results found for this or any previous visit (from the past 240 hour(s)).   Studies: No results  found.  Scheduled Meds:    . amLODipine  2.5 mg Oral Daily  . aspirin  300 mg Rectal Daily   Or  . aspirin  325 mg Oral Daily  . cloNIDine  0.2 mg Transdermal Weekly  . collagenase   Topical Daily  . darbepoetin (ARANESP) injection - DIALYSIS  100 mcg Intravenous Q Sat-HD  . doxercalciferol  0.5 mcg Intravenous Q T,Th,Sa-HD  . famotidine  20 mg Oral Daily  . feeding supplement (NEPRO CARB STEADY)  474 mL Per Tube BID  . free water  100 mL Per Tube Q6H  . heparin  5,000 Units Subcutaneous Q8H  . insulin aspart  0-9 Units Subcutaneous TID WC    . insulin detemir  10 Units Subcutaneous QHS  . iron polysaccharides  150 mg Oral Daily  . mirtazapine  15 mg Oral QHS  . PARoxetine  20 mg Oral q morning - 10a  . simvastatin  10 mg Oral q1800  . sodium chloride  3 mL Intravenous Q12H   Continuous Infusions:    Active Problems:  Altered mental status  Syncope  ESRD (end stage renal disease)  Diabetes mellitus, type 2  HTN (hypertension)  Anemia  Sacral decubitus ulcer  FTT (failure to thrive) in adult  CVA (cerebral infarction)  Adult failure to thrive  Pain, generalized    Meredeth Ide, MD  [facility] Pager [phone]. If 7PM-7AM, please contact night-coverage at [email or web address], password [facility] 08/15/2012, 2:55 PM  LOS: 13 days

## 2012-08-15 NOTE — Progress Notes (Signed)
NUTRITION FOLLOW UP  Intervention:   1. Continue current tube feed regimen of two 8 oz cans of Nepro BID (4 cans total) via PEG to provide 1700 kcal, 76 grams of protein and 688 ml of free water.   2. Continue 100 ml free water flushes Q6 hr to provide a daily total of 1088 ml free water.   Nutrition Dx:   Inadequate oral intake related to inability to eat as evidenced by NPO status  Goal:   Meet >/=90% estimated needs via PEG  Monitor:   Weight trends, I/O's, labs   Assessment:    Since pt last seen, PEG was placed and tube feeding was initiated according to recommendations. RD spoke with nurse regarding tolerance.  Pt is tolerating current TF regimen.  Pt is still receiving HD treatment and is currently waiting for placement in LTACH.    Height: Ht Readings from Last 1 Encounters:  08/07/12 5\' 2"  (1.575 m)    Weight Status:   Wt Readings from Last 1 Encounters:  08/14/12 94 lb 2.2 oz (42.7 kg)    Re-estimated needs:  Kcal: 1500 - 1700  Protein: 65 - 75 g  Fluid: urine output + 1000 cc/day   Skin: Stage IV pressure ulcer on lower back   Diet Order: NPO   Intake/Output Summary (Last 24 hours) at 08/15/12 1110 Last data filed at 08/15/12 0830  Gross per 24 hour  Intake      0 ml  Output   -811 ml  Net    811 ml    Last BM: 08/13/2012   Labs:   Lab 08/14/12 1448 08/11/12 0629 08/10/12 0610  NA 132* 141 144  K 3.5 3.9 4.3  CL 93* 102 104  CO2 28 26 26   BUN 65* 40* 21  CREATININE 4.41* 4.42* 3.45*  CALCIUM 8.1* 8.0* 8.4  MG -- -- --  PHOS 1.5* -- --  GLUCOSE 391* 309* 105*    CBG (last 3)   Basename 08/15/12 0756 08/14/12 2217 08/14/12 1146  GLUCAP 109* 110* 329*    Scheduled Meds:   . amLODipine  2.5 mg Oral Daily  . aspirin  300 mg Rectal Daily   Or  . aspirin  325 mg Oral Daily  . cloNIDine  0.2 mg Transdermal Weekly  . collagenase   Topical Daily  . darbepoetin (ARANESP) injection - DIALYSIS  100 mcg Intravenous Q Sat-HD  .  doxercalciferol  0.5 mcg Intravenous Q T,Th,Sa-HD  . famotidine  20 mg Oral Daily  . feeding supplement (NEPRO CARB STEADY)  474 mL Per Tube BID  . free water  100 mL Per Tube Q6H  . heparin  5,000 Units Subcutaneous Q8H  . insulin aspart  0-9 Units Subcutaneous TID WC  . insulin detemir  10 Units Subcutaneous QHS  . iron polysaccharides  150 mg Oral Daily  . mirtazapine  15 mg Oral QHS  . PARoxetine  20 mg Oral q morning - 10a  . simvastatin  10 mg Oral q1800  . sodium chloride  3 mL Intravenous Q12H    Continuous Infusions:   Belenda Cruise  Dietetic Intern Pager: [phone]     Clarene Duke RD, LDN Pager [phone] After Hours pager [phone]

## 2012-08-16 DIAGNOSIS — I10 Essential (primary) hypertension: Secondary | ICD-10-CM

## 2012-08-16 LAB — CBC
HCT: 33.5 % — ABNORMAL LOW (ref 36.0–46.0)
MCH: 27.9 pg (ref 26.0–34.0)
MCHC: 30.4 g/dL (ref 30.0–36.0)
MCV: 91.8 fL (ref 78.0–100.0)
Platelets: 258 10*3/uL (ref 150–400)
RDW: 16.2 % — ABNORMAL HIGH (ref 11.5–15.5)
WBC: 12.8 10*3/uL — ABNORMAL HIGH (ref 4.0–10.5)

## 2012-08-16 LAB — RENAL FUNCTION PANEL
Albumin: 1.7 g/dL — ABNORMAL LOW (ref 3.5–5.2)
BUN: 46 mg/dL — ABNORMAL HIGH (ref 6–23)
Calcium: 8.1 mg/dL — ABNORMAL LOW (ref 8.4–10.5)
Chloride: 96 mEq/L (ref 96–112)
Creatinine, Ser: 3.26 mg/dL — ABNORMAL HIGH (ref 0.50–1.10)
GFR calc non Af Amer: 12 mL/min — ABNORMAL LOW (ref >90)
Phosphorus: 0.8 mg/dL — CL (ref 2.3–4.6)

## 2012-08-16 MED ORDER — DOXERCALCIFEROL 4 MCG/2ML IV SOLN
INTRAVENOUS | Status: AC
  Administered 2012-08-16: 0.5 ug via INTRAVENOUS
  Filled 2012-08-16: qty 2

## 2012-08-16 MED ORDER — POTASSIUM PHOSPHATE MONOBASIC 500 MG PO TABS
500.0000 mg | ORAL_TABLET | Freq: Three times a day (TID) | ORAL | Status: DC
  Administered 2012-08-16 – 2012-08-17 (×3): 500 mg via ORAL
  Filled 2012-08-16 (×8): qty 1

## 2012-08-16 NOTE — Progress Notes (Signed)
[facility] PROGRESS NOTE  Linda [name] ZOX:[id number] DOB: 09-24-1930 DOA: 08/02/2012 PCP: Colon Branch, MD Brief Narrative: This is a 79 year old female nursing facility resident, with known history of ESRD on HD T-T-S, chronic anemia, DM, dyslipidemia, secondary hyperparathyroidism, HTN, PVD, s/p bilateral BKA, GERD, sacral decubitus s/p recent debridement,s/p recent hospitalization at [facility], for pyelonephritis, treated with Tobramycin, then discharged on Vancomycin with HD. Apparently, patient is s/p HD on 08/02/12, which she completed without incident, then en route to the nursing facility, she had an episode of unresponsiveness, associated with facial drooping, lasting for about 20 mins, before patient arrived at the nursing facility. She was then brought to the ED. Per ED MD, patient on arrival, was asymptomatic and at baseline mental status. She was admitted for further evaluation and management.   Assessment/Plan: Altered mental status/CVA Patient presented with what was described as an episode of very poor responsiveness, associated with facial droop, following HD. Clinically, she does have some weakness of left upper extremity. Head CT scan revealed an infarct within the right parietal lobe which may be subacute to chronic. Patient reportedly, was last seen normal at 2:15 PM on 08/02/12, so she was outside the window for t-PA, and symptoms had improved on arrival, although her other co-morbidities, including a moderate anemia, may also have been contraindications. Dr. Wyatt Portela provided neurology consultation and CVA w/u, carried out. Brain MRI showed subcentimeter left frontal subcortical white matter infarct, approximately 5 mm in diameter. No hemorrhage. Advanced atrophy and extensive chronic microvascular ischemic change. Remote ischemic events are stable. MRA revealed potentially flow reducing lesion supraclinoid internal carotid artery on the right, estimated  50-75% stenosis. Widespread intracranial atherosclerotic disease elsewhere. 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 Carotid doppler showed no evidence of hemodynamically significant internal carotid artery stenosis. Vertebral artery flow is antegrade. 2D Echocardiogram revealed normal LV cavity size, mild LVH and EF of 65% to 70%. Continued on anti-platelet medication. EKG shows SR, with old Q-waves in V1-V2, but no acute ischemic changes. SLP has evaluated and recommended Dysphagia 1 with pudding thick consistency.  Due to failure to thrive, PEG was placed on 08/09/2012 for nutrition.  ESRD (end stage renal disease) Patient is on HD T-T-S, and is s/p hemodialysis on 08/02/12. Dr Delano Metz provided nephrology consultation and regular dialysis schedule has been reinstated. Patient was found on 08/07/12, patient to have a clotted AV-fistula and had declotting performed on 08/08/2012.  Discussed with Dr. Briant Cedar due to patient's decubitus ulcer may have difficulty sitting for outpatient dialysis. Had HD on 08/11/2012. Likely benefit from [facility] for wound care and hemodialysis.  Diabetes mellitus, type 2 Patient has insulin-requiring type 2 diabetes mellitus. CBG was 66 on presentation, but came up to 80, while in the ED. Hypoglycemia resolved. Now on SSI. Increase levemir to 10 units SQ qhs. CBGs stable.  HTN (hypertension) Better controlled. On amlodipine 2.5 mg daily. Clonidine patch 0.2 mg. Further titration as outpatient.  Anemia Normocytic and due to chronic disease and ESRD. Patient transfused 2 units of pRBC on 08/08/2012. Hemoglobin stable.  Sacral decubitus ulcer Patient has a rather large, approximately 8 cm x 9 cm stage 4 sacral decubitus, and is reportedly, s/p recent debridement. Wound shows evidence of granulation, and does not appear overly infected. Utilizing mattress overlay. Patient has been evaluated by [facility],  has undergone further sharp debridement on 08/04/12, as well as hydrotherapy. Managing as recommended.   Recent pyelonephritis  Patient is reportedly, s/p recent hospitalization at [facility], for a pyelonephritis, for which she was treated with Tobramycin, then Vancomycin. She is afebrile, and does not look toxic. Repeat urinalysis revealed pyuria, but culture showed no growth.  Failure to thrive  It appears that patient has had FTT and poor oral intake. Dr. Brien Few has discussed with daughter on 08/04/12, and after conferring with family, they have elected to proceed. PEG placed on 08/09/2012 and started on tube feeds on 08/10/2012.  Patient and family had a meeting with palliative care, family changed code status to DNR, but wanted to continue medical management including HD.  Code Status: DNR/DNI. Family Communication: No family at bedside.  Disposition Plan:  DC to LTAC when bed available  Consultants:  Dr. Leroy Kennedy, neurology  Dr. Dayle Points. Briant Cedar, renal  Palliative care  Procedures:  Head CT  CXR  Antibiotics:  None.  HPI/Subjective: Patient alert today and answered all the questions appropriately. Objective: Filed Vitals:   08/15/12 2100 08/16/12 0257 08/16/12 0626 08/16/12 1000  BP: 168/55 157/53 155/65 140/70  Pulse: 87 97 97 80  Temp: 98.4 F (36.9 C) 98.3 F (36.8 C) 98.6 F (37 C) 97.4 F (36.3 C)  TempSrc: Oral Oral Oral Oral  Resp: 16 18 20 20   Height:      Weight:      SpO2: 95% 92% 99%     Intake/Output Summary (Last 24 hours) at 08/16/12 1225 Last data filed at 08/15/12 1451  Gross per 24 hour  Intake    100 ml  Output      0 ml  Net    100 ml   Filed Weights   08/11/12 1831 08/14/12 1445 08/14/12 1852  Weight: 41.8 kg (92 lb 2.4 oz) 43.4 kg (95 lb 10.9 oz) 42.7 kg (94 lb 2.2 oz)    Exam: Physical Exam: General: Awake, No acute distress. HEENT: EOMI. Neck: Supple CV: S1 and S2 Lungs: Clear to ascultation bilaterally Abdomen:  Soft, Nontender, Nondistended, +bowel sounds. Ext: Good pulses. Trace edema. Back: Decubitus ulcer, sharp margins, no signs of infection.  Data Reviewed: Basic Metabolic Panel:  Lab 08/14/12 1610 08/11/12 0629 08/10/12 0610  NA 132* 141 144  K 3.5 3.9 4.3  CL 93* 102 104  CO2 28 26 26   GLUCOSE 391* 309* 105*  BUN 65* 40* 21  CREATININE 4.41* 4.42* 3.45*  CALCIUM 8.1* 8.0* 8.4  MG -- -- --  PHOS 1.5* -- --   Liver Function Tests:  Lab 08/14/12 1448  AST --  ALT --  ALKPHOS --  BILITOT --  PROT --  ALBUMIN 1.7*   No results found for this basename: LIPASE:5,AMYLASE:5 in the last 168 hours No results found for this basename: AMMONIA:5 in the last 168 hours CBC:  Lab 08/14/12 1448 08/11/12 0629 08/10/12 0610  WBC 11.0* 11.7* 11.0*  NEUTROABS -- -- --  HGB 10.7* 10.8* 10.5*  HCT 34.6* 35.4* 33.8*  MCV 91.8 90.3 89.4  PLT 235 182 170   Cardiac Enzymes: No results found for this basename: CKTOTAL:5,CKMB:5,CKMBINDEX:5,TROPONINI:5 in the last 168 hours BNP (last 3 results)  Basename 09/14/11 1223  PROBNP 8852.0*   CBG:  Lab 08/16/12 0805 08/16/12 9604 08/15/12 1956 08/15/12 1648 08/15/12 1142  GLUCAP 63* 90 175* 258* 209*    No results found for this or any previous visit (from the past 240 hour(s)).   Studies: No results found.  Scheduled Meds:    . amLODipine  2.5 mg Oral Daily  .  aspirin  300 mg Rectal Daily   Or  . aspirin  325 mg Oral Daily  . cloNIDine  0.2 mg Transdermal Weekly  . collagenase   Topical Daily  . darbepoetin (ARANESP) injection - DIALYSIS  100 mcg Intravenous Q Sat-HD  . doxercalciferol  0.5 mcg Intravenous Q T,Th,Sa-HD  . famotidine  20 mg Oral Daily  . feeding supplement (NEPRO CARB STEADY)  474 mL Per Tube BID  . free water  100 mL Per Tube Q6H  . heparin  5,000 Units Subcutaneous Q8H  . insulin aspart  0-9 Units Subcutaneous TID WC  . insulin detemir  10 Units Subcutaneous QHS  . iron polysaccharides  150 mg Oral Daily  .  mirtazapine  15 mg Oral QHS  . PARoxetine  20 mg Oral q morning - 10a  . simvastatin  10 mg Oral q1800  . sodium chloride  3 mL Intravenous Q12H   Continuous Infusions:    Active Problems:  Altered mental status  Syncope  ESRD (end stage renal disease)  Diabetes mellitus, type 2  HTN (hypertension)  Anemia  Sacral decubitus ulcer  FTT (failure to thrive) in adult  CVA (cerebral infarction)  Adult failure to thrive  Pain, generalized    Meredeth Ide, MD  [facility] Pager [phone]. If 7PM-7AM, please contact night-coverage at [email or web address], password [facility] 08/16/2012, 12:25 PM  LOS: 14 days

## 2012-08-16 NOTE — Progress Notes (Addendum)
Notified by lab Maurine Minister) of critical value low Phosphorus 0.8  Emelia Loron, PA notified.   Notify Dr. Arlean Hopping  Notify Dr. Allena Katz.

## 2012-08-16 NOTE — Progress Notes (Signed)
Subjective:   Very weak in bed, speech not strong unable to understand.  Palliative care is working with family she is now DNR but we are to continue HD.  Thought she was being transferred to [facility] but that hasnt happened yet.  Will need HD in the acute unit today while we are waiting for transfer.    Objective Vital signs in last 24 hours: Filed Vitals:   08/15/12 2100 08/16/12 0257 08/16/12 0626 08/16/12 1000  BP: 168/55 157/53 155/65 140/70  Pulse: 87 97 97 80  Temp: 98.4 F (36.9 C) 98.3 F (36.8 C) 98.6 F (37 C) 97.4 F (36.3 C)  TempSrc: Oral Oral Oral Oral  Resp: 16 18 20 20   Height:      Weight:      SpO2: 95% 92% 99%    Weight change:   Intake/Output Summary (Last 24 hours) at 08/16/12 1231 Last data filed at 08/15/12 1451  Gross per 24 hour  Intake    100 ml  Output      0 ml  Net    100 ml   Labs: Basic Metabolic Panel:  Lab 08/14/12 1610 08/11/12 0629 08/10/12 0610  NA 132* 141 144  K 3.5 3.9 4.3  CL 93* 102 104  CO2 28 26 26   GLUCOSE 391* 309* 105*  BUN 65* 40* 21  CREATININE 4.41* 4.42* 3.45*  CALCIUM 8.1* 8.0* 8.4  ALB -- -- --  PHOS 1.5* -- --   Liver Function Tests:  Lab 08/14/12 1448  AST --  ALT --  ALKPHOS --  BILITOT --  PROT --  ALBUMIN 1.7*   No results found for this basename: LIPASE:3,AMYLASE:3 in the last 168 hours No results found for this basename: AMMONIA:3 in the last 168 hours CBC:  Lab 08/14/12 1448 08/11/12 0629 08/10/12 0610  WBC 11.0* 11.7* 11.0*  NEUTROABS -- -- --  HGB 10.7* 10.8* 10.5*  HCT 34.6* 35.4* 33.8*  MCV 91.8 90.3 89.4  PLT 235 182 170   Cardiac Enzymes: No results found for this basename: CKTOTAL:5,CKMB:5,CKMBINDEX:5,TROPONINI:5 in the last 168 hours CBG:  Lab 08/16/12 1222 08/16/12 0805 08/16/12 0638 08/15/12 1956 08/15/12 1648  GLUCAP 149* 63* 90 175* 258*    Iron Studies: No results found for this basename: IRON,TIBC,TRANSFERRIN,FERRITIN in the last 72 hours Studies/Results: No results  found. Medications: Infusions:    Scheduled Medications:    . amLODipine  2.5 mg Oral Daily  . aspirin  300 mg Rectal Daily   Or  . aspirin  325 mg Oral Daily  . cloNIDine  0.2 mg Transdermal Weekly  . collagenase   Topical Daily  . darbepoetin (ARANESP) injection - DIALYSIS  100 mcg Intravenous Q Sat-HD  . doxercalciferol  0.5 mcg Intravenous Q T,Th,Sa-HD  . famotidine  20 mg Oral Daily  . feeding supplement (NEPRO CARB STEADY)  474 mL Per Tube BID  . free water  100 mL Per Tube Q6H  . heparin  5,000 Units Subcutaneous Q8H  . insulin aspart  0-9 Units Subcutaneous TID WC  . insulin detemir  10 Units Subcutaneous QHS  . iron polysaccharides  150 mg Oral Daily  . mirtazapine  15 mg Oral QHS  . PARoxetine  20 mg Oral q morning - 10a  . simvastatin  10 mg Oral q1800  . sodium chloride  3 mL Intravenous Q12H    have reviewed scheduled and prn medications.  Physical Exam: General: thin, weak, garbled speech Heart: RRR Lungs: mostly clear  Abdomen: soft, non tender Extremities:minimal edema Dialysis Access: R AVF, good thrill and bruit    Assessment/ Plan: Pt is a 79 y.o. yo female with ESRD who was admitted on 08/02/2012 with  CVA but also has many comorbids including stage 4 decub and essentially bed bound status right now  Assessment/Plan: 1. S/p CVA- slow progress in all rehab areas 2. ESRD - have been continuing HD every TTS via AVF however,  patient not a candidate for continued OP HD due to immobility/decub/mental status.  Agree with continued conversations between palliative care and family to determine disposition.  Regular unit is [facility]   3. Anemia- hgb 10.8, on aranesp, continue 4. Secondary hyperparathyroidism- calcium and phos controlled.  On vitamin D 5. HTN/volume- BP up and down. UF as able with HD, gets low BP with HD 6. Dispo- I agree cannot return to her previous arrangement, if cant sit up in chair for HD will need LTAC.  I see plans for transfer  to LTAC when bed available.    Bailyn Spackman A   08/16/2012,12:31 PM  LOS: 14 days

## 2012-08-16 NOTE — Progress Notes (Signed)
Dialysis initiated using aeseptic technique via RUA AVG 15 ga x 1" up/up without difficulty.

## 2012-08-16 NOTE — Care Management Note (Signed)
    Page 1 of 2   08/17/2012     4:16:17 PM   CARE MANAGEMENT NOTE 08/17/2012  Patient:  Linda [name], Linda [name]   Account Number:  [id number]  Date Initiated:  08/09/2012  Documentation initiated by:  Jacquelynn Cree  Subjective/Objective Assessment:   admitted with AMS from SNF     Action/Plan:   return to SNF  patient unable to sit up for dialysis, will need LTAC   Anticipated DC Date:  08/17/2012   Anticipated DC Plan:  LONG TERM ACUTE CARE (LTAC)  In-house referral  Clinical Social Worker      DC Planning Services  CM consult      Choice offered to / List presented to:             Status of service:  Completed, signed off Medicare Important Message given?   (If response is "NO", the following Medicare IM given date fields will be blank) Date Medicare IM given:   Date Additional Medicare IM given:    Discharge Disposition:  LONG TERM ACUTE CARE (LTAC)  Per UR Regulation:  Reviewed for med. necessity/level of care/duration of stay  If discussed at Long Length of Stay Meetings, dates discussed:   08/09/2012  08/16/2012    Comments:  08/17/12 Spoke with daughter Linda [name] by phone, she stated that she had spokem with Linda [name] and they are agreeable to transfer to [facility] today. Linda [name] from [name] had spoken with Linda [name] and she stated to him that she was agreeable to transfer to [name] today. Contacted Dr. Ervin Knack, she stated that it is OK for patient's hemodialysis to change to MWF schedule which is the dialysis schedule at [name]. Patient transferred to [name] today. Jacquelynn Cree RN, BSN, CCM   08/16/12 Received VM from Linda [name] that she had call 08/15/12 PM. Called her and left message on her cell phone and called her home # [phone]. Verified with [facility] that they will not have a bed availble until 08/20/12.Contacted Linda [name] at [facility] and verified that they have hemodialysis beds available. Received call from Linda [name], explained that I had not been  able to speak with Linda [name]. Explained that [facility] was not going to be able to take Linda [name] but [name] would be able to take her tomorrow. She agreed to having Linda [name] call her. Contacted Make and gave him contact information for Linda [name]. Will continue to follow. Jacquelynn Cree RN, BSN, CCM   08/15/12 Spoke with patient's daughter Linda [name] by phone, explained that per [facility], they did not have a bed available today and unsure if they will have bed available tomorrow but that [name] has beds available. She requested that I call her sister in law Linda [name] who is local to the patient and is HCPOA, cell # [phone]. Call Linda [name] and left a VM.  Elliot Cousin, RN Case Manager Signed CASE MANAGEMENT Progress Notes 08/14/2012 3:19 PM NCM spoke to dtr, and states she does prefer [facility] but she will discuss with family about [name]. And make NCM aware in am. Isidoro Donning RN CCM Case Mgmt phone [phone]  Elliot Cousin, RN Case Manager Signed CASE MANAGEMENT Progress Notes 08/14/2012 3:09 PM NCM spoke to [facility] Coordinator, and no bed availability. Attempted call to dtr, [name] for alternate LTAC. Left message in vm for a return call. Isidoro Donning RN CCM Case Mgmt phone [phone]

## 2012-08-17 LAB — GLUCOSE, CAPILLARY
Glucose-Capillary: 177 mg/dL — ABNORMAL HIGH (ref 70–99)
Glucose-Capillary: 198 mg/dL — ABNORMAL HIGH (ref 70–99)

## 2012-08-17 MED ORDER — POTASSIUM PHOSPHATE MONOBASIC 500 MG PO TABS: 500.0000 mg | ORAL_TABLET | Freq: Three times a day (TID) | ORAL | Status: AC

## 2012-08-17 NOTE — Progress Notes (Signed)
Called [facility] and gave report to Anna,RN. Faxed the AVS to [phone]. Transportation has been arranged with [facility] and they are here now to transport the patient. Pt is all set ready to move, no concerns.

## 2012-08-17 NOTE — Progress Notes (Signed)
Subjective: Patient seen and examined, somnolent but able to answer questions. Patient has a bed available at [facility] Vitals:   08/17/12 1014  BP: 147/74  Pulse: 90  Temp: 97.8 F (36.6 C)  Resp: 20    Chest: Clear Bilaterally Heart : S1S2 RRR Abdomen: Soft, nontender Ext : No edema Neuro:Somnolent  A/P Altered mental status/CVA  Patient presented with what was described as an episode of very poor responsiveness, associated with facial droop, following HD. Clinically, she does have some weakness of left upper extremity. Head CT scan revealed an infarct within the right parietal lobe which may be subacute to chronic. Patient reportedly, was last seen normal at 2:15 PM on 08/02/12, so she was outside the window for t-PA, and symptoms had improved on arrival, although her other co-morbidities, including a moderate anemia, may also have been contraindications. Dr. Wyatt Portela provided neurology consultation and CVA w/u, carried out. Brain MRI showed subcentimeter left frontal subcortical white matter infarct, approximately 5 mm in diameter. No hemorrhage. Advanced atrophy and extensive chronic microvascular ischemic change. Remote ischemic events are stable. MRA revealed potentially flow reducing lesion supraclinoid internal carotid artery on the right, estimated 50-75% stenosis. Widespread intracranial atherosclerotic disease elsewhere. 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 Carotid doppler showed no evidence of hemodynamically significant internal carotid artery stenosis. Vertebral artery flow is antegrade. 2D Echocardiogram revealed normal LV cavity size, mild LVH and EF of 65% to 70%. Continued on anti-platelet medication. EKG shows SR, with old Q-waves in V1-V2, but no acute ischemic changes. SLP has evaluated and recommended Dysphagia 1 with pudding thick consistency. Due to failure to thrive, PEG was placed on  08/09/2012 for nutrition.  ESRD (end stage renal disease)  Patient is on HD T-T-S, and is s/p hemodialysis on 08/02/12. Dr Delano Metz provided nephrology consultation and regular dialysis schedule has been reinstated. Patient was found on 08/07/12, patient to have a clotted AV-fistula and had declotting performed on 08/08/2012. Discussed with Dr. Briant Cedar due to patient's decubitus ulcer may have difficulty sitting for outpatient dialysis. Had HD on 08/11/2012. Likely benefit from [facility] for wound care and hemodialysis.  Diabetes mellitus, type 2  Patient has insulin-requiring type 2 diabetes mellitus. CBG was 66 on presentation, but came up to 80, while in the ED. Hypoglycemia resolved. Now on SSI. Increase levemir to 10 units SQ qhs. CBGs stable.  HTN (hypertension)  Better controlled. On amlodipine 2.5 mg daily. Clonidine patch 0.2 mg. Further titration as outpatient.  Anemia  Normocytic and due to chronic disease and ESRD. Patient transfused 2 units of pRBC on 08/08/2012. Hemoglobin stable.  Sacral decubitus ulcer  Patient has a rather large, approximately 8 cm x 9 cm stage 4 sacral decubitus, and is reportedly, s/p recent debridement. Wound shows evidence of granulation, and does not appear overly infected. Utilizing mattress overlay. Patient has been evaluated by [facility], has undergone further sharp debridement on 08/04/12, as well as hydrotherapy. Managing as recommended.  Recent pyelonephritis  Patient is reportedly, s/p recent hospitalization at [facility], for a pyelonephritis, for which she was treated with Tobramycin, then Vancomycin. She is afebrile, and does not look toxic. Repeat urinalysis revealed pyuria, but culture showed no growth.  Failure to thrive  It appears that patient has had FTT and poor oral intake. Dr. Brien Few has discussed with daughter on 08/04/12, and after conferring with family, they have elected to proceed. PEG placed on 08/09/2012 and started on  tube feeds on 08/10/2012.  Patient and family had a meeting with palliative care, family changed code status to DNR, but wanted to continue medical management including HD.     Meredeth Ide [facility]ist/Palliative Medicine Team [phone]

## 2012-08-17 NOTE — Progress Notes (Signed)
PT HYDROTHERAPY PROGRESS NOTE   08/17/12 1500  Subjective Assessment  Subjective Pt minimally responsive today.   Date of Onset (prior to admission)  Evaluation and Treatment  Evaluation and Treatment Procedures Explained to Patient/Family Yes  Evaluation and Treatment Procedures Patient unable to consent due to mental status  Wound 08/02/12 Other (Comment) Back Mid;Lower  Date First Assessed/Time First Assessed: 08/02/12 1820   Wound Type: (c) Other (Comment)  Location: Back  Location Orientation: Mid;Lower  Present on Admission: Yes  Site / Wound Assessment Pink;Red;Yellow  % Wound base Red or Granulating 85%  % Wound base Yellow 10%  % Wound base Black 0% (less than)  % Wound base Other (Comment) 5% (greater than)  Peri-wound Assessment Pink;Erythema (blanchable)  Wound Length (cm) 12 cm  Wound Width (cm) 11 cm  Wound Depth (cm) 4 cm  Margins Unattacted edges (unapproximated)  Closure None  Drainage Amount Moderate  Drainage Description Serosanguineous  Non-staged Wound Description Full thickness  Treatment Hydrotherapy (Pulse lavage);Packing (Saline gauze);Tape changed  Dressing Type ABD;Other (Comment);Barrier Film (skin prep);Gauze (Comment)  Dressing Changed Changed  Dressing Status Dry;Intact  Hydrotherapy  Pulsed Lavage with Suction (psi) 4 psi  Pulsed Lavage with Suction - Normal Saline Used 1000 mL  Pulsed Lavage Tip Tip with splash shield  Pulsed lavage therapy - wound location Sacral wound  Selective Debridement  Selective Debridement - Location sacral wound  Selective Debridement - Tools Used Forceps;Scalpel;Scissors  Selective Debridement - Tissue Removed larger area of black removed at 9 oclock  Wound Therapy - Assess/Plan/Recommendations  Wound Therapy - Clinical Statement Upon reassessment, pt wound continues to make steady progress in healing. Wound bed continues to present with increased red beefy granulation, and less necrotic tissue. Anticipate pt will  be ready to d/c hydrotherapy in 2-3 visits.  Wound Therapy - Functional Problem List decreased skin integrity, decreased functional mobility  Factors Delaying/Impairing Wound Healing Altered sensation;Diabetes Mellitus;Multiple medical problems;Vascular compromise;Infection - systemic/local  Hydrotherapy Plan Debridement;Patient/family education;Pulsatile lavage with suction  Wound Therapy - Frequency 6X / week  Wound Plan Will change to 3x wk as indicated in new orders  Wound Therapy Goals - Improve the function of patient's integumentary system by progressing the wound(s) through the phases of wound healing by:  Decrease Necrotic Tissue to <10%  Decrease Necrotic Tissue - Progress Progressing toward goal  Increase Granulation Tissue to >90%  Increase Granulation Tissue - Progress Progressing toward goal  Improve Drainage Characteristics Min  Improve Drainage Characteristics - Progress Not progressing  Time For Goal Achievement 7 days  Wound Therapy - Potential for Goals Fair    Charlotte Crumb, PT DPT  [phone]

## 2012-08-17 NOTE — Discharge Summary (Addendum)
Physician Discharge Summary  Linda [name] GEX:[id number] DOB: 08/12/1930 DOA: 08/02/2012  PCP: Colon Branch, MD  Admit date: 08/02/2012 Discharge date: 08/17/2012  Time spent: 40 minutes  Recommendations for Outpatient Follow-up:  1. To LTAC  Discharge Diagnoses:  Active Problems:  Altered mental status  Syncope  ESRD (end stage renal disease)  Diabetes mellitus, type 2  HTN (hypertension)  Anemia  Sacral decubitus ulcer  FTT (failure to thrive) in adult  CVA (cerebral infarction)  Adult failure to thrive  Pain, generalized   Discharge Condition: Stable  Diet recommendation: NPO, on tube feeds.  Filed Weights   08/14/12 1445 08/14/12 1852 08/16/12 1226  Weight: 43.4 kg (95 lb 10.9 oz) 42.7 kg (94 lb 2.2 oz) 45.1 kg (99 lb 6.8 oz)    History of present illness:  This is a 79 year old female nursing facility resident, with known history of ESRD on HD T-T-S, chronic anemia, DM, dyslipidemia, secondary hyperparathyroidism, HTN, PVD, s/p bilateral BKA, GERD, sacral decubitus s/p recent debridement,s/p recent hospitalization at [facility], for pyelonephritis, treated with Tobramycin, then discharged on Vancomycin with HD. Apparently, patient is s/p HD on 08/02/12, which she completed without incident, then en route to the nursing facility, she had an episode of unresponsiveness, associated with facial drooping, lasting for about 20 mins, before patient arrived at the nursing facility. She was then brought to the ED. Per ED MD, patient on arrival, was asymptomatic and at baseline mental status. She was admitted for further evaluation and management.   Hospital Course:  Altered mental status/CVA  Patient presented with what was described as an episode of very poor responsiveness, associated with facial droop, following HD. Clinically, she does have some weakness of left upper extremity. Head CT scan revealed an infarct within the right parietal lobe which may be subacute  to chronic. Patient reportedly, was last seen normal at 2:15 PM on 08/02/12, so she was outside the window for t-PA, and symptoms had improved on arrival, although her other co-morbidities, including a moderate anemia, may also have been contraindications. Dr. Wyatt Portela provided neurology consultation and CVA w/u, carried out. Brain MRI showed subcentimeter left frontal subcortical white matter infarct, approximately 5 mm in diameter. No hemorrhage. Advanced atrophy and extensive chronic microvascular ischemic change. Remote ischemic events are stable. MRA revealed potentially flow reducing lesion supraclinoid internal carotid artery on the right, estimated 50-75% stenosis. Widespread intracranial atherosclerotic disease elsewhere. 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 Carotid doppler showed no evidence of hemodynamically significant internal carotid artery stenosis. Vertebral artery flow is antegrade. 2D Echocardiogram revealed normal LV cavity size, mild LVH and EF of 65% to 70%. Continued on anti-platelet medication. EKG shows SR, with old Q-waves in V1-V2, but no acute ischemic changes. SLP has evaluated and recommended Dysphagia 1 with pudding thick consistency. Due to failure to thrive, PEG was placed on 08/09/2012 for nutrition. Patient has fluctuating mental status.   ESRD (end stage renal disease)  Patient is on HD T-T-S, and is s/p hemodialysis on 08/02/12. Dr Delano Metz provided nephrology consultation and regular dialysis schedule has been reinstated. Patient was found on 08/07/12, patient to have a clotted AV-fistula and had declotting performed on 08/08/2012. Discussed with Dr. Briant Cedar due to patient's decubitus ulcer may have difficulty sitting for outpatient dialysis. Likely benefit from [facility] for wound care and hemodialysis.   Diabetes mellitus, type 2  Patient has insulin-requiring type 2 diabetes mellitus.  CBG was  66 on presentation, but came up to 80, while in the ED. Hypoglycemia resolved. Now on SSI. Continue levemir to 10 units SQ qhs. CBGs stable.   HTN (hypertension)  Better controlled. On amlodipine 2.5 mg daily. Clonidine patch 0.2 mg. Further titration as outpatient.  Anemia  Normocytic and due to chronic disease and ESRD. Patient transfused 2 units of pRBC on 08/08/2012. Hemoglobin stable.   Sacral decubitus ulcer  Patient has a rather large, approximately 8 cm x 9 cm stage 4 sacral decubitus, and is reportedly, s/p recent debridement. Wound shows evidence of granulation, and does not appear overly infected. Utilizing mattress overlay. Patient has been evaluated by [facility], has undergone further sharp debridement on 08/04/12, as well as hydrotherapy. Managing as recommended.   Recent pyelonephritis  Patient is reportedly, s/p recent hospitalization at [facility], for a pyelonephritis, for which she was treated with Tobramycin, then Vancomycin. She is afebrile, and does not look toxic. Repeat urinalysis revealed pyuria, but culture showed no growth.   Failure to thrive  It appears that patient has had FTT and poor oral intake. Dr. Brien Few has discussed with daughter on 08/04/12, and after conferring with family, they have elected to proceed. PEG placed on 08/09/2012 and started on tube feeds on 08/10/2012. Patient and family had a meeting with palliative care, family changed code status to DNR, but wanted to continue medical management including HD.   Code Status: DNR/DNI.   Consultants:  Dr. Leroy Kennedy, neurology  Dr. Schertz/Dr. Briant Cedar, renal  Palliative care  Procedures:  Head CT  CXR HD  Antibiotics:  None.  Discharge Exam: Filed Vitals:   08/16/12 2200 08/17/12 0200 08/17/12 0600 08/17/12 1014  BP: 97/50 112/58 118/56 147/74  Pulse:  82 80 90  Temp: 98.2 F (36.8 C) 98 F (36.7 C) 97.9 F (36.6 C) 97.8 F (36.6 C)  TempSrc: Oral Oral Oral Oral  Resp: 20 20 20 20    Height:      Weight:      SpO2: 98% 96% 98% 95%   Discharge Instructions   Medications: Kphos 500 mg 3 times daily with meals and at bedtime       Discharge Orders    Future Orders Please Complete By Expires   Increase activity slowly      Discharge instructions      Comments:   Diet: Feeding supplement (NEPRO CARB STEADY) liquid 474 mL per Tube, 2 times daily. Free water 100 ccs q6h.       Medication List     As of 08/17/2012 10:52 AM    STOP taking these medications         cloNIDine 0.3 mg/24hr   Commonly known as: CATAPRES - Dosed in mg/24 hr   Replaced by: cloNIDine 0.2 mg/24hr patch      diltiazem 240 MG 24 hr capsule   Commonly known as: CARDIZEM CD      HYDROcodone-acetaminophen 5-325 MG per tablet   Commonly known as: NORCO/VICODIN      insulin lispro 100 UNIT/ML injection   Commonly known as: HUMALOG      PRESCRIPTION MEDICATION      risperiDONE 0.5 MG tablet   Commonly known as: RISPERDAL      WHEY PROTEIN PO      TAKE these medications         acetaminophen 325 MG tablet   Commonly known as: TYLENOL   Take 2 tablets (650 mg total) by mouth every 4 (four) hours as needed (or temp >/=  99.5 F).      amLODipine 2.5 MG tablet   Commonly known as: NORVASC   Take 1 tablet (2.5 mg total) by mouth daily.      aspirin 325 MG tablet   Place 1 tablet (325 mg total) into feeding tube daily.      b complex-vitamin c-folic acid 0.8 MG Tabs   Take 0.8 mg by mouth daily.      bisacodyl 10 MG suppository   Commonly known as: DULCOLAX   Place 1 suppository (10 mg total) rectally daily as needed for constipation.      cloNIDine 0.2 mg/24hr patch   Commonly known as: CATAPRES - Dosed in mg/24 hr   Place 1 patch (0.2 mg total) onto the skin once a week.      collagenase ointment   Commonly known as: SANTYL   Apply topically daily.      darbepoetin 100 MCG/0.5ML Soln   Commonly known as: ARANESP   Inject 0.5 mLs (100 mcg total) into the vein every  Saturday with hemodialysis.      doxercalciferol 4 MCG/2ML injection   Commonly known as: HECTOROL   Inject 0.25 mLs (0.5 mcg total) into the vein Every Tuesday,Thursday,and Saturday with dialysis.      feeding supplement (NEPRO CARB STEADY) Liqd   Place 474 mLs into feeding tube 2 (two) times daily.      free water Soln   Place 100 mLs into feeding tube every 6 (six) hours.      insulin aspart 100 UNIT/ML injection   Commonly known as: novoLOG   Inject 0-9 Units into the skin 3 (three) times daily with meals. Correction coverage: Sensitive (thin, NPO, renal)  CBG < 70: implement hypoglycemia protocol  CBG 70 - 120: 0 units  CBG 121 - 150: 1 unit  CBG 151 - 200: 2 units  CBG 201 - 250: 3 units  CBG 251 - 300: 5 units  CBG 301 - 350: 7 units  CBG 351 - 400: 9 units  CBG > 400: call MD      insulin detemir 100 UNIT/ML injection   Commonly known as: LEVEMIR   Inject 10 Units into the skin at bedtime.      iron polysaccharides 150 MG capsule   Commonly known as: NIFEREX   Take 150 mg by mouth daily.      mirtazapine 15 MG tablet   Commonly known as: REMERON   Take 15 mg by mouth at bedtime.      ondansetron 4 MG tablet   Commonly known as: ZOFRAN   Take 4 mg by mouth every 12 (twelve) hours as needed. For nausea/vomiting      PARoxetine 20 MG tablet   Commonly known as: PAXIL   Take 20 mg by mouth every morning.      polyethylene glycol packet   Commonly known as: MIRALAX / GLYCOLAX   Take 17 g by mouth 2 (two) times daily.      ranitidine 150 MG tablet   Commonly known as: ZANTAC   Take 150 mg by mouth at bedtime.      simvastatin 10 MG tablet   Commonly known as: ZOCOR   Take 10 mg by mouth daily.      vitamin C 500 MG tablet   Commonly known as: ASCORBIC ACID   Take 500 mg by mouth 3 (three) times daily.      ZINC-220 220 MG capsule   Generic drug: zinc sulfate  Take 220 mg by mouth daily.        Follow-up Information    Follow up with  Colon Branch, MD. Schedule an appointment as soon as possible for a visit in 1 week.   Contact information:   [address] [place] [address] [phone]           The results of significant diagnostics from this hospitalization (including imaging, microbiology, ancillary and laboratory) are listed below for reference.    Significant Diagnostic Studies: Ct Head Wo Contrast  08/02/2012  *RADIOLOGY REPORT*  Clinical Data: Altered mental status.  Possible TIA.  CT HEAD WITHOUT CONTRAST  Technique:  Contiguous axial images were obtained from the base of the skull through the vertex without contrast.  Comparison: 10/28/2011  Findings: Infarct noted within the right parietal lobe.  This is new since prior study but is likely subacute to chronic.  Diffuse cerebral atrophy.  Mild chronic small vessel disease changes.  No hydrocephalus.  No hemorrhage.  No mass lesion.  Extensive dural calcifications along the falx.  No acute calvarial abnormality. Mastoids are clear.  IMPRESSION: Infarct within the right parietal lobe which I suspect is subacute to chronic.  Atrophy, chronic small vessel disease.   Original Report Authenticated By: Charlett Nose, M.D.    Mr Brain Wo Contrast  08/03/2012  *RADIOLOGY REPORT*  Clinical Data:  Unresponsiveness associated with facial droop following hemodialysis.  Chronic renal failure.  MRI HEAD WITHOUT CONTRAST MRA HEAD WITHOUT CONTRAST  Technique:  Multiplanar, multiecho pulse sequences of the brain and surrounding structures were obtained without intravenous contrast. Angiographic images of the head were obtained using MRA technique without contrast.  Comparison:  Most recent CT head 08/02/2012.  MRI HEAD  Findings:  The patient had difficulty remaining motionless for the study.  Images are suboptimal.  Small or subtle lesions could be overlooked.  There is a subcentimeter infarct affecting the left frontal white matter in a subcortical location, approximately 5 mm in  diameter. No other areas of acute infarction are seen.  There is no   intracranial hemorrhage, mass lesion, hydrocephalus, or extra-axial fluid. There is advanced atrophy and extensive chronic microvascular ischemic change.  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  Tiny focus chronic hemorrhage left posterior temporal region.  Remote brainstem lacunar infarcts.  Remote left basal ganglia lacunar infarcts.  Extensive dural calcification. No scalp or osseous lesions.  No gross midline abnormality.  Chronic sinus disease.  No acute mastoid fluid. Compared with prior CT, this infarct is not visible.  IMPRESSION: Subcentimeter left frontal subcortical white matter infarct, approximately 5 mm in diameter.  No hemorrhage.  Advanced atrophy and extensive chronic microvascular ischemic change.  Remote ischemic events are stable.  MRA HEAD  Findings: There is a 50-75% stenosis of the supraclinoid right ICA. The left internal carotid artery shows mild nonstenotic irregularity in its supraclinoid segment.  Long segment 75-90% stenosis proximal right anterior cerebral artery.  Both distal ACAs fill from the left.  Mild nonstenotic irregularity of the proximal middle cerebral arteries bilaterally. Poor flow related enhancement of the distal MCA branches on the right is noted, reflecting previous right hemisphere ischemic event.  The basilar artery is widely patent with left vertebral dominant. Hypoplasia of the distal right vertebral relates to primarily supply of this vessel of the PICA.  There is mild irregularity of the distal PCA without proximal narrowing.  Cerebellar branches are poorly seen.  There is a 3 mm  aneurysm projecting anteriorly from the right internal carotid artery at the ophthalmic origin.  A tiny inferior outpouching from the supraclinoid ICA on the left, no more than 1 mm in size (image 73 series 5) could be atherosclerotic in nature, or arise from a paraophthalmic  location.  IMPRESSION: Potentially flow reducing lesion supraclinoid internal carotid artery on the right, estimated 50-75% stenosis.Widespread intracranial atherosclerotic disease elsewhere.  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   Original Report Authenticated By: Davonna Belling, M.D.    Ir Gastrostomy Tube Mod Sed  08/09/2012  *RADIOLOGY REPORT*  PULL THROUGH GASTROSTOMY TUBE PLACEMENT UNDER FLUOROSCOPIC GUIDANCE  Date: August 09, 2012  Clinical History: 79 year old female with a history of stroke, altered mental status, failure to thrive and protein calorie malnutrition.  A percutaneous gastrostomy tube is required for parenteral nutrition.  Procedures Performed:  1.  Fluoroscopically guided placement of percutaneous pull-through gastrostomy tube.  Interventional Radiologist:  Sterling Big, MD  Sedation: Moderate (conscious) sedation was used.  1.5 mg Versed, 75 mcg Fentanyl were administered intravenously.  The patient's vital signs were monitored continuously by radiology nursing throughout the procedure.  Sedation Time: 24 minutes  Fluoroscopy time: 2.4 minutes  Contrast volume: 20 ml Omnipaque-300 administered into the GI tract  Antibiotics:  1g Ancef was administered intravenously within 1 hour of skin incision.  PROCEDURE/FINDINGS:   Informed consent was obtained from the patient following explanation of the procedure, risks, benefits and alternatives. The patient understands, agrees and consents for the procedure. All questions were addressed. A time out was performed.  Maximal barrier sterile technique utilized including caps, mask, sterile gowns, sterile gloves, large sterile drape, hand hygiene, and chlorhexadine skin prep.  An angled catheter was advanced over a wire under fluoroscopic guidance through the nose, down the esophagus and into the body of the stomach.  The stomach was then insufflated with several 100  ml of air.  Fluoroscopy confirmed location of the gastric bubble, as well as inferior displacement of the barium stained colon.  Under direct fluoroscopic guidance, a single T-tack was placed, and the anterior gastric wall drawn up against the anterior abdominal wall. Percutaneous access was then obtained into the mid gastric body with an 18 gauge sheath needle.  Aspiration of air, and injection of contrast material under fluoroscopy confirmed needle placement.  An Amplatz wire was advanced in the gastric body and the access needle exchanged for a 9-French vascular sheath.  A snare device was advanced through the vascular sheath and an Amplatz wire advanced through the angled catheter.  The Amplatz wire was successfully snared and this was pulled up through the esophagus and out the mouth.  A 20-French Burnell Blanks MIC-PEG tube was then connected to the snare and pulled through the mouth, down the esophagus, into the stomach and out to the anterior abdominal wall. Hand injection of contrast material confirmed intragastric location. The T-tack retention suture was then cut. The pull through peg tube was then secured with the external bumper and capped.  The patient will be observed for several hours with the newly placed tube on low wall suction to evaluate for any post procedure complication.  The patient tolerated the procedure well, there is no immediate complication.  IMPRESSION:  Successful placement of a 20 French pull through gastrostomy tube.  The tube will be ready for use at noon on Friday gender 20 11/04/2012.  Signed,  Sterling Big, MD Vascular &  Interventional Radiologist [facility] Radiology   Original Report Authenticated By: Malachy Moan, M.D.    Ir Pta Venous Right  08/08/2012  *RADIOLOGY REPORT*  Clinical data:  Occluded dialysis graft.  Previous declot and venous anastomotic PTA 11/16/2011.  DIALYSIS GRAFT DECLOT VENOUS ANGIOPLASTY ULTRASOUND GUIDANCE FOR VASCULAR ACCESS X2   Comparison: 11/16/2011  Technique: The procedure, risks (including but not limited to bleeding, infection, organ damage), benefits, and alternatives were explained to the patient.  Questions regarding the procedure were encouraged and answered.  The patient understands and consents to the procedure.  Intravenous Fentanyl and Versed were administered as conscious sedation during continuous cardiorespiratory monitoring by the radiology RN, with a total moderate sedation time of 40 minutes.   The graft just central to the arterial anastomosis was accessed antegrade with a 21-gauge micropuncture needle under real-time ultrasonic guidance after the overlying skin prepped with Betadine, draped in usual sterile fashion, infiltrated locally with 1% lidocaine. Needle exchanged over 018 guidewire for transitional dilator through which 2 mg t-PA was administered. Ultrasound imaging documentation was saved. Through the antegrade dilator, a Bentson wire was advanced to the venous anastomosis. Over this a 5F sheath was placed, through which a 5 [place] Kumpe catheter was advanced for outflow venography. This showed patency of the outflow venous system through the SVC. 3000 units heparin were administered. The Arrow PTD device was used to macerate thrombus in the graft. Injection showed   clearance of thrombus from the graft.   In similar fashion, the more central aspect of the graft was accessed retrograde under ultrasound with a micropuncture needle, exchanged for a transitional dilator.The venous limb dilator was exchanged in similar fashion for a 6 French vascular sheath. The Kumpe catheter was advanced and used to gain access to the proximal brachial artery.  The Kumpe exchanged for a the Fogarty catheter, used to dislodge the platelet plug  into the   graft, where it was macerated. The Fogarty was used again to remove any residual platelet plug from the arterial anastomosis.  A follow shuntogram was performed, demonstrating  good flow through the outflow vein, no extravasation.  Balloon angioplasty of the graft and venous anastomosis was performed using a 7 mm x 4 cm Conquest angioplasty balloon with good response.  Balloon was removed and injection showed patency of the anastomosis, without extravasation or other apparent complication.  Reflux across the arterial anastomosis demonstrate this to be widely patent, with unremarkable appearance of the visualized native arterial circulation. The catheter and sheaths were then removed and hemostasis achieved with 2-0 Ethilon sutures. Patient tolerated procedure well.  IMPRESSION  1. Technically successful declot of right upper arm synthetic straight      hemodialysis graft.  2. Technically successful balloon angioplasty of venous anastomotic stenosis.  Access management: Remains approachable for percutaneous intervention as needed.   Original Report Authenticated By: D. Andria Rhein, MD    Ir Angio Av Shunt Addl Access  08/08/2012  *RADIOLOGY REPORT*  Clinical data:  Occluded dialysis graft.  Previous declot and venous anastomotic PTA 11/16/2011.  DIALYSIS GRAFT DECLOT VENOUS ANGIOPLASTY ULTRASOUND GUIDANCE FOR VASCULAR ACCESS X2  Comparison: 11/16/2011  Technique: The procedure, risks (including but not limited to bleeding, infection, organ damage), benefits, and alternatives were explained to the patient.  Questions regarding the procedure were encouraged and answered.  The patient understands and consents to the procedure.  Intravenous Fentanyl and Versed were administered as conscious sedation during continuous cardiorespiratory monitoring by the radiology RN, with a total moderate sedation  time of 40 minutes.   The graft just central to the arterial anastomosis was accessed antegrade with a 21-gauge micropuncture needle under real-time ultrasonic guidance after the overlying skin prepped with Betadine, draped in usual sterile fashion, infiltrated locally with 1% lidocaine. Needle  exchanged over 018 guidewire for transitional dilator through which 2 mg t-PA was administered. Ultrasound imaging documentation was saved. Through the antegrade dilator, a Bentson wire was advanced to the venous anastomosis. Over this a 15F sheath was placed, through which a 5 [place] Kumpe catheter was advanced for outflow venography. This showed patency of the outflow venous system through the SVC. 3000 units heparin were administered. The Arrow PTD device was used to macerate thrombus in the graft. Injection showed   clearance of thrombus from the graft.   In similar fashion, the more central aspect of the graft was accessed retrograde under ultrasound with a micropuncture needle, exchanged for a transitional dilator.The venous limb dilator was exchanged in similar fashion for a 6 French vascular sheath. The Kumpe catheter was advanced and used to gain access to the proximal brachial artery.  The Kumpe exchanged for a the Fogarty catheter, used to dislodge the platelet plug  into the   graft, where it was macerated. The Fogarty was used again to remove any residual platelet plug from the arterial anastomosis.  A follow shuntogram was performed, demonstrating good flow through the outflow vein, no extravasation.  Balloon angioplasty of the graft and venous anastomosis was performed using a 7 mm x 4 cm Conquest angioplasty balloon with good response.  Balloon was removed and injection showed patency of the anastomosis, without extravasation or other apparent complication.  Reflux across the arterial anastomosis demonstrate this to be widely patent, with unremarkable appearance of the visualized native arterial circulation. The catheter and sheaths were then removed and hemostasis achieved with 2-0 Ethilon sutures. Patient tolerated procedure well.  IMPRESSION  1. Technically successful declot of right upper arm synthetic straight      hemodialysis graft.  2. Technically successful balloon angioplasty of venous  anastomotic stenosis.  Access management: Remains approachable for percutaneous intervention as needed.   Original Report Authenticated By: D. Andria Rhein, MD    Ir US Guide Vasc Access Right  08/08/2012  *RADIOLOGY REPORT*  Clinical data:  Occluded dialysis graft.  Previous declot and venous anastomotic PTA 11/16/2011.  DIALYSIS GRAFT DECLOT VENOUS ANGIOPLASTY ULTRASOUND GUIDANCE FOR VASCULAR ACCESS X2  Comparison: 11/16/2011  Technique: The procedure, risks (including but not limited to bleeding, infection, organ damage), benefits, and alternatives were explained to the patient.  Questions regarding the procedure were encouraged and answered.  The patient understands and consents to the procedure.  Intravenous Fentanyl and Versed were administered as conscious sedation during continuous cardiorespiratory monitoring by the radiology RN, with a total moderate sedation time of 40 minutes.   The graft just central to the arterial anastomosis was accessed antegrade with a 21-gauge micropuncture needle under real-time ultrasonic guidance after the overlying skin prepped with Betadine, draped in usual sterile fashion, infiltrated locally with 1% lidocaine. Needle exchanged over 018 guidewire for transitional dilator through which 2 mg t-PA was administered. Ultrasound imaging documentation was saved. Through the antegrade dilator, a Bentson wire was advanced to the venous anastomosis. Over this a 15F sheath was placed, through which a 5 [place] Kumpe catheter was advanced for outflow venography. This showed patency of the outflow venous system through the SVC. 3000 units heparin were administered. The Arrow PTD device was used to  macerate thrombus in the graft. Injection showed   clearance of thrombus from the graft.   In similar fashion, the more central aspect of the graft was accessed retrograde under ultrasound with a micropuncture needle, exchanged for a transitional dilator.The venous limb dilator was exchanged in  similar fashion for a 6 French vascular sheath. The Kumpe catheter was advanced and used to gain access to the proximal brachial artery.  The Kumpe exchanged for a the Fogarty catheter, used to dislodge the platelet plug  into the   graft, where it was macerated. The Fogarty was used again to remove any residual platelet plug from the arterial anastomosis.  A follow shuntogram was performed, demonstrating good flow through the outflow vein, no extravasation.  Balloon angioplasty of the graft and venous anastomosis was performed using a 7 mm x 4 cm Conquest angioplasty balloon with good response.  Balloon was removed and injection showed patency of the anastomosis, without extravasation or other apparent complication.  Reflux across the arterial anastomosis demonstrate this to be widely patent, with unremarkable appearance of the visualized native arterial circulation. The catheter and sheaths were then removed and hemostasis achieved with 2-0 Ethilon sutures. Patient tolerated procedure well.  IMPRESSION  1. Technically successful declot of right upper arm synthetic straight      hemodialysis graft.  2. Technically successful balloon angioplasty of venous anastomotic stenosis.  Access management: Remains approachable for percutaneous intervention as needed.   Original Report Authenticated By: D. Andria Rhein, MD    Dg Chest Portable 1 View  08/02/2012  *RADIOLOGY REPORT*  Clinical Data: Facial droop.  PORTABLE CHEST - 1 VIEW  Comparison: 03/06/2012  Findings: Heart is upper limits normal in size.  No confluent airspace opacities or effusions.  Mild peribronchial thickening. No acute bony abnormality.  IMPRESSION: Stable mild peribronchial thickening.   Original Report Authenticated By: Charlett Nose, M.D.    Dg Abd Portable 1v  08/09/2012  *RADIOLOGY REPORT*  Clinical Data: Evaluate residual barium prior to G-tube placement  PORTABLE ABDOMEN - 1 VIEW  Comparison: KUB of 08/07/2012  Findings: The retained barium  resides exclusively within the right colon.  An NG tube is present with the tip overlying the distal antrum of the stomach.  The bowel gas pattern is nonspecific.  A moderate amount of feces is present throughout the colon.  The bones are diffusely osteopenic and there is a slight lumbar curvature convex to the left.  IMPRESSION: Retained barium is within the right colon exclusively.  Moderate amount of feces throughout the colon.   Original Report Authenticated By: Dwyane Dee, M.D.    Dg Abd Portable 1v  08/07/2012  *RADIOLOGY REPORT*  Clinical Data: Percutaneous gastrostomy tube placement  PORTABLE ABDOMEN - 1 VIEW  Comparison: Earlier same day; 08/06/2012  Findings:  Minimal transit of previously administered enteric contrast, now seen within the more distal small bowel.  Paucity of bowel gas without definite evidence of obstruction.  No supine evidence of pneumoperitoneum.  Enteric tube tip and sideport project over the expected location of the gastric fundus.  Limited visualization of the lower thorax demonstrates mild elevation of the right hemidiaphragm.  Mild multilevel lumbar spine degenerative change.  Osteopenia without definite fracture.  Vascular calcifications.  IMPRESSION: 1.  Minimal transit of previously ingested enteric contrast, now seen within the more distal small bowel, but has not yet reached the transverse colon.  2.  Nonobstructive bowel gas pattern.   Original Report Authenticated By: Tacey Ruiz, MD  Dg Abd Portable 1v  08/07/2012  *RADIOLOGY REPORT*  Clinical Data: Evaluate barium prior to potential gastrostomy tube placement  PORTABLE ABDOMEN - 1 VIEW  Comparison: 08/06/2012; CT abdomen pelvis - 12/09/2006  Findings:  Administered enteric contrast seen to the level of the mid/distal small bowel.  Evaluation of the bowel gas pattern is degraded secondary to patient motion artifact.  No definite evidence of obstruction. Evaluation for pneumoperitoneum is degraded secondary to  patient motion artifact and exclusion of the lower thorax.  Osteopenia with suspected multilevel degenerative change.  Vascular calcifications.  IMPRESSION: Enteric contrast seen to the level of the mid/distal small bowel. No definite evidence of obstruction.   Original Report Authenticated By: Tacey Ruiz, MD    Dg Abd Portable 1v  08/06/2012  *RADIOLOGY REPORT*  Clinical Data: 79 year old female NG tube placement.  PORTABLE ABDOMEN - 1 VIEW  Comparison: CT abdomen 12/09/2006.  Findings: Portable supine AP view 1720 hours.  NG tube in place, tip projects in the right upper quadrant, likely in the proximal duodenum.  Mild motion artifact. Nonobstructed bowel gas pattern. Grossly clear lung bases. Aorto iliac calcified atherosclerosis.  IMPRESSION: NG tube in place, tip at the level of the proximal duodenum.   Original Report Authenticated By: Erskine Speed, M.D.    Mr Mra Head/brain Wo Cm  08/03/2012  *RADIOLOGY REPORT*  Clinical Data:  Unresponsiveness associated with facial droop following hemodialysis.  Chronic renal failure.  MRI HEAD WITHOUT CONTRAST MRA HEAD WITHOUT CONTRAST  Technique:  Multiplanar, multiecho pulse sequences of the brain and surrounding structures were obtained without intravenous contrast. Angiographic images of the head were obtained using MRA technique without contrast.  Comparison:  Most recent CT head 08/02/2012.  MRI HEAD  Findings:  The patient had difficulty remaining motionless for the study.  Images are suboptimal.  Small or subtle lesions could be overlooked.  There is a subcentimeter infarct affecting the left frontal white matter in a subcortical location, approximately 5 mm in diameter. No other areas of acute infarction are seen.  There is no   intracranial hemorrhage, mass lesion, hydrocephalus, or extra-axial fluid. There is advanced atrophy and extensive chronic microvascular ischemic change.  Remote right hemisphere infarct affects the posterior frontal and parietal  lobes.  Tiny focus chronic hemorrhage left posterior temporal region.  Remote brainstem lacunar infarcts.  Remote left basal ganglia lacunar infarcts.  Extensive dural calcification. No scalp or osseous lesions.  No gross midline abnormality.  Chronic sinus disease.  No acute mastoid fluid. Compared with prior CT, this infarct is not visible.  IMPRESSION: Subcentimeter left frontal subcortical white matter infarct, approximately 5 mm in diameter.  No hemorrhage.  Advanced atrophy and extensive chronic microvascular ischemic change.  Remote ischemic events are stable.  MRA HEAD  Findings: There is a 50-75% stenosis of the supraclinoid right ICA. The left internal carotid artery shows mild nonstenotic irregularity in its supraclinoid segment.  Long segment 75-90% stenosis proximal right anterior cerebral artery.  Both distal ACAs fill from the left.  Mild nonstenotic irregularity of the proximal middle cerebral arteries bilaterally. Poor flow related enhancement of the distal MCA branches on the right is noted, reflecting previous right hemisphere ischemic event.  The basilar artery is widely patent with left vertebral dominant. Hypoplasia of the distal right vertebral relates to primarily supply of this vessel of the PICA.  There is mild irregularity of the distal PCA without proximal narrowing.  Cerebellar branches are poorly seen.  There is a 3  mm aneurysm projecting anteriorly from the right internal carotid artery at the ophthalmic origin.  A tiny inferior outpouching from the supraclinoid ICA on the left, no more than 1 mm in size (image 73 series 5) could be atherosclerotic in nature, or arise from a paraophthalmic location.  IMPRESSION: Potentially flow reducing lesion supraclinoid internal carotid artery on the right, estimated 50-75% stenosis.Widespread intracranial atherosclerotic disease elsewhere.  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   Original Report Authenticated By: Davonna Belling, M.D.    Ir Declot Right Mod Sed  08/08/2012  *RADIOLOGY REPORT*  Clinical data:  Occluded dialysis graft.  Previous declot and venous anastomotic PTA 11/16/2011.  DIALYSIS GRAFT DECLOT VENOUS ANGIOPLASTY ULTRASOUND GUIDANCE FOR VASCULAR ACCESS X2  Comparison: 11/16/2011  Technique: The procedure, risks (including but not limited to bleeding, infection, organ damage), benefits, and alternatives were explained to the patient.  Questions regarding the procedure were encouraged and answered.  The patient understands and consents to the procedure.  Intravenous Fentanyl and Versed were administered as conscious sedation during continuous cardiorespiratory monitoring by the radiology RN, with a total moderate sedation time of 40 minutes.   The graft just central to the arterial anastomosis was accessed antegrade with a 21-gauge micropuncture needle under real-time ultrasonic guidance after the overlying skin prepped with Betadine, draped in usual sterile fashion, infiltrated locally with 1% lidocaine. Needle exchanged over 018 guidewire for transitional dilator through which 2 mg t-PA was administered. Ultrasound imaging documentation was saved. Through the antegrade dilator, a Bentson wire was advanced to the venous anastomosis. Over this a 50F sheath was placed, through which a 5 [place] Kumpe catheter was advanced for outflow venography. This showed patency of the outflow venous system through the SVC. 3000 units heparin were administered. The Arrow PTD device was used to macerate thrombus in the graft. Injection showed   clearance of thrombus from the graft.   In similar fashion, the more central aspect of the graft was accessed retrograde under ultrasound with a micropuncture needle, exchanged for a transitional dilator.The venous limb dilator was exchanged in similar fashion for a 6 French vascular sheath. The Kumpe catheter was advanced and used to gain  access to the proximal brachial artery.  The Kumpe exchanged for a the Fogarty catheter, used to dislodge the platelet plug  into the   graft, where it was macerated. The Fogarty was used again to remove any residual platelet plug from the arterial anastomosis.  A follow shuntogram was performed, demonstrating good flow through the outflow vein, no extravasation.  Balloon angioplasty of the graft and venous anastomosis was performed using a 7 mm x 4 cm Conquest angioplasty balloon with good response.  Balloon was removed and injection showed patency of the anastomosis, without extravasation or other apparent complication.  Reflux across the arterial anastomosis demonstrate this to be widely patent, with unremarkable appearance of the visualized native arterial circulation. The catheter and sheaths were then removed and hemostasis achieved with 2-0 Ethilon sutures. Patient tolerated procedure well.  IMPRESSION  1. Technically successful declot of right upper arm synthetic straight      hemodialysis graft.  2. Technically successful balloon angioplasty of venous anastomotic stenosis.  Access management: Remains approachable for percutaneous intervention as needed.   Original Report Authenticated By: D. Andria Rhein, MD    Ir Radiologist Eval & Mgmt  08/08/2012  *RADIOLOGY REPORT*  Clinical data:  Occluded dialysis graft.  Previous declot and  venous anastomotic PTA 11/16/2011.  DIALYSIS GRAFT DECLOT VENOUS ANGIOPLASTY ULTRASOUND GUIDANCE FOR VASCULAR ACCESS X2  Comparison: 11/16/2011  Technique: The procedure, risks (including but not limited to bleeding, infection, organ damage), benefits, and alternatives were explained to the patient.  Questions regarding the procedure were encouraged and answered.  The patient understands and consents to the procedure.  Intravenous Fentanyl and Versed were administered as conscious sedation during continuous cardiorespiratory monitoring by the radiology RN, with a total moderate  sedation time of 40 minutes.   The graft just central to the arterial anastomosis was accessed antegrade with a 21-gauge micropuncture needle under real-time ultrasonic guidance after the overlying skin prepped with Betadine, draped in usual sterile fashion, infiltrated locally with 1% lidocaine. Needle exchanged over 018 guidewire for transitional dilator through which 2 mg t-PA was administered. Ultrasound imaging documentation was saved. Through the antegrade dilator, a Bentson wire was advanced to the venous anastomosis. Over this a 43F sheath was placed, through which a 5 [place] Kumpe catheter was advanced for outflow venography. This showed patency of the outflow venous system through the SVC. 3000 units heparin were administered. The Arrow PTD device was used to macerate thrombus in the graft. Injection showed   clearance of thrombus from the graft.   In similar fashion, the more central aspect of the graft was accessed retrograde under ultrasound with a micropuncture needle, exchanged for a transitional dilator.The venous limb dilator was exchanged in similar fashion for a 6 French vascular sheath. The Kumpe catheter was advanced and used to gain access to the proximal brachial artery.  The Kumpe exchanged for a the Fogarty catheter, used to dislodge the platelet plug  into the   graft, where it was macerated. The Fogarty was used again to remove any residual platelet plug from the arterial anastomosis.  A follow shuntogram was performed, demonstrating good flow through the outflow vein, no extravasation.  Balloon angioplasty of the graft and venous anastomosis was performed using a 7 mm x 4 cm Conquest angioplasty balloon with good response.  Balloon was removed and injection showed patency of the anastomosis, without extravasation or other apparent complication.  Reflux across the arterial anastomosis demonstrate this to be widely patent, with unremarkable appearance of the visualized native arterial  circulation. The catheter and sheaths were then removed and hemostasis achieved with 2-0 Ethilon sutures. Patient tolerated procedure well.  IMPRESSION  1. Technically successful declot of right upper arm synthetic straight      hemodialysis graft.  2. Technically successful balloon angioplasty of venous anastomotic stenosis.  Access management: Remains approachable for percutaneous intervention as needed.   Original Report Authenticated By: D. Andria Rhein, MD     Microbiology: No results found for this or any previous visit (from the past 240 hour(s)).   Labs: Basic Metabolic Panel:  Lab 08/16/12 8295 08/14/12 1448 08/11/12 0629  NA 135 132* 141  K 3.8 3.5 3.9  CL 96 93* 102  CO2 30 28 26   GLUCOSE 203* 391* 309*  BUN 46* 65* 40*  CREATININE 3.26* 4.41* 4.42*  CALCIUM 8.1* 8.1* 8.0*  MG -- -- --  PHOS 0.8* 1.5* --   Liver Function Tests:  Lab 08/16/12 1300 08/14/12 1448  AST -- --  ALT -- --  ALKPHOS -- --  BILITOT -- --  PROT -- --  ALBUMIN 1.7* 1.7*   No results found for this basename: LIPASE:5,AMYLASE:5 in the last 168 hours No results found for this basename: AMMONIA:5 in the last 168  hours CBC:  Lab 08/16/12 1300 08/14/12 1448 08/11/12 0629  WBC 12.8* 11.0* 11.7*  NEUTROABS -- -- --  HGB 10.2* 10.7* 10.8*  HCT 33.5* 34.6* 35.4*  MCV 91.8 91.8 90.3  PLT 258 235 182   Cardiac Enzymes: No results found for this basename: CKTOTAL:5,CKMB:5,CKMBINDEX:5,TROPONINI:5 in the last 168 hours BNP: BNP (last 3 results)  Basename 09/14/11 1223  PROBNP 8852.0*   CBG:  Lab 08/17/12 0640 08/16/12 2137 08/16/12 1826 08/16/12 1222 08/16/12 0805  GLUCAP 177* 167* 137* 149* 63*       Signed:  Zayra Devito S  [facility] 08/17/2012, 10:52 AM

## 2012-09-27 ENCOUNTER — Ambulatory Visit: Payer: Self-pay | Admitting: Vascular Surgery

## 2012-10-02 IMAGING — CT CT HEAD W/O CM
2 series · 15 of 30 positions shown, 19 images · non-contrast
Comparison: 08/11/2011

CLINICAL DATA: Altered mental status.

CT HEAD WITHOUT CONTRAST
TECHNIQUE: Contiguous axial images were obtained from the base of
the skull through the vertex without contrast.

[Series 2: head w/o · axial · non-contrast · 0.43mm/px · z∈[+1058,+1173]mm · 13 of 27 slices shown, 17 images]
[im 2/27  brain]
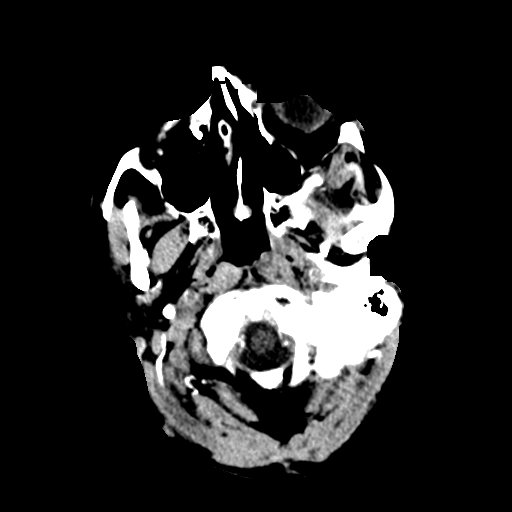
[im 2/27  bone]
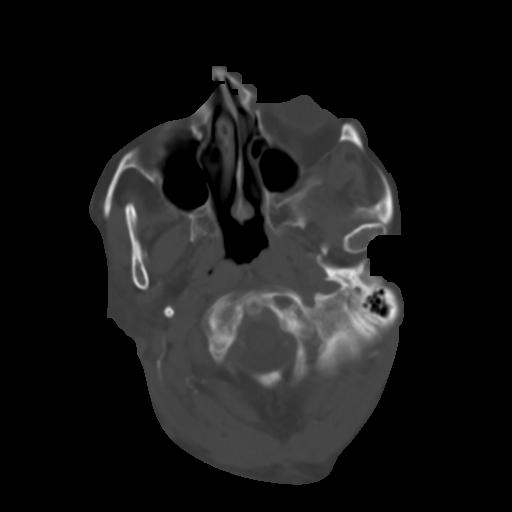
[im 4/27  brain]
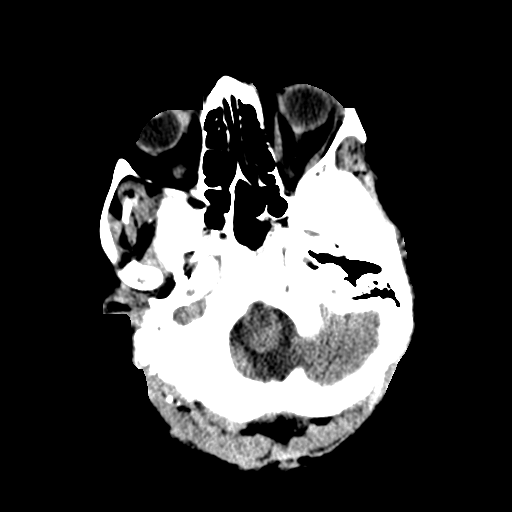
[im 6/27  brain]
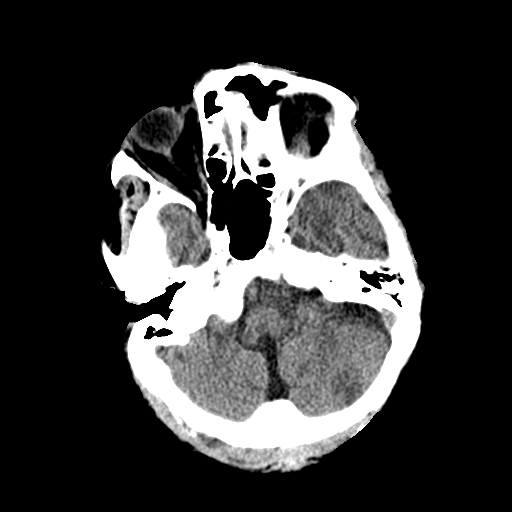
[im 8/27  brain]
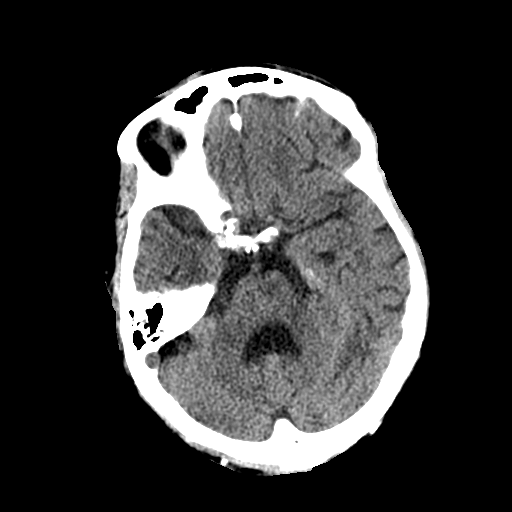
[im 10/27  brain]
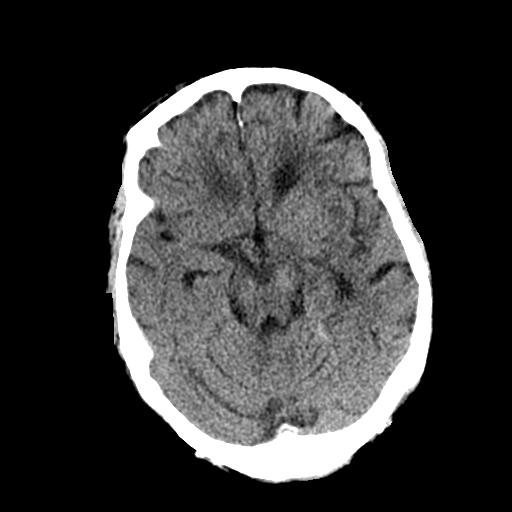
[im 10/27  bone]
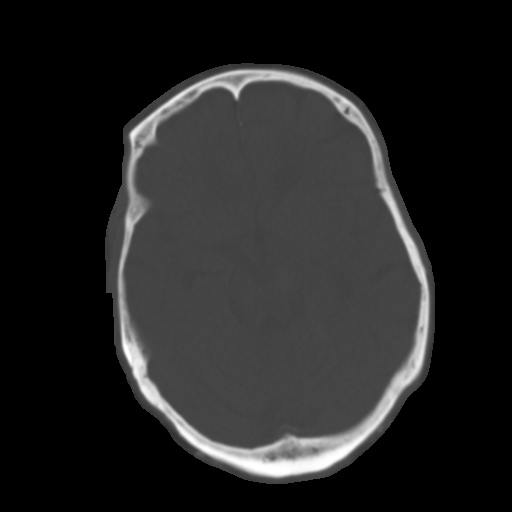
[im 12/27  brain]
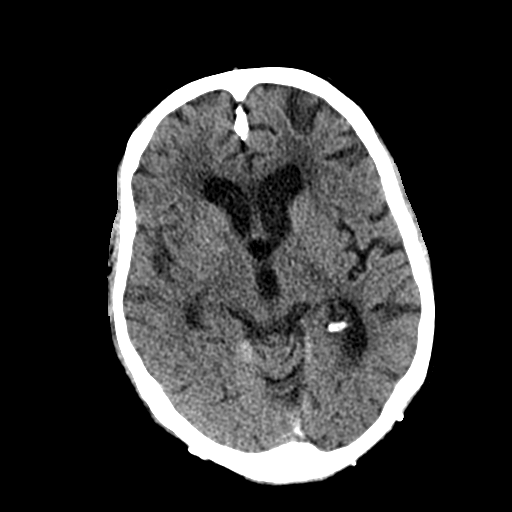
[im 14/27  brain]
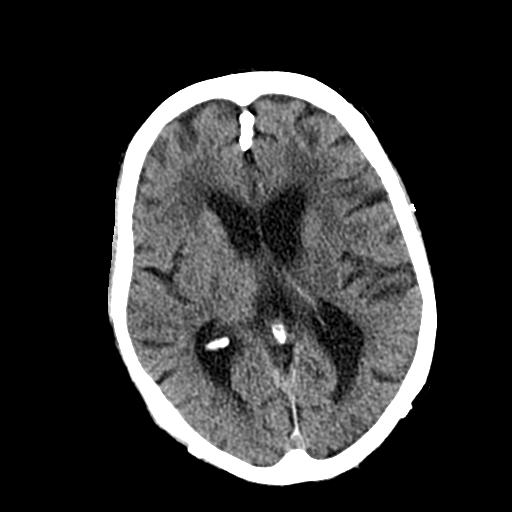
[im 15/27  brain]
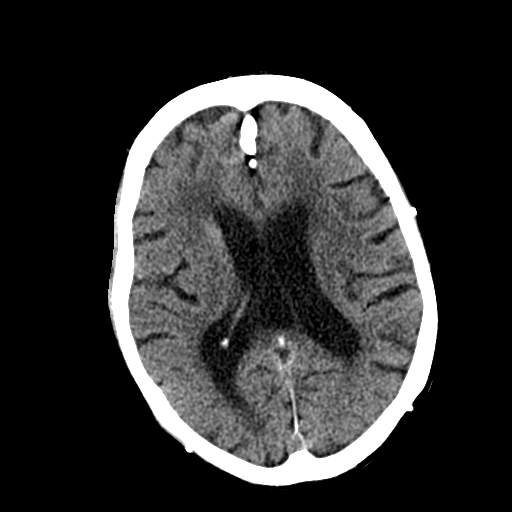
[im 17/27  brain]
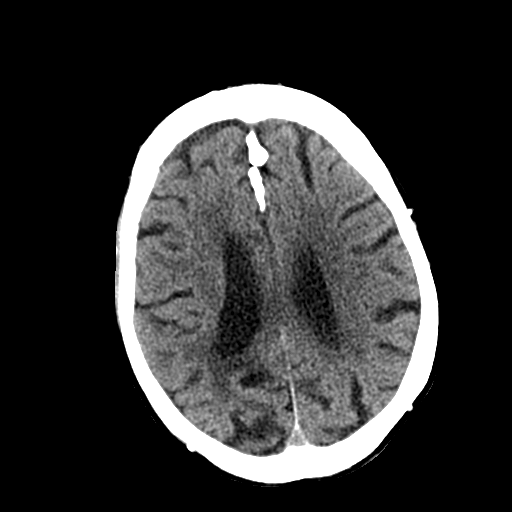
[im 17/27  bone]
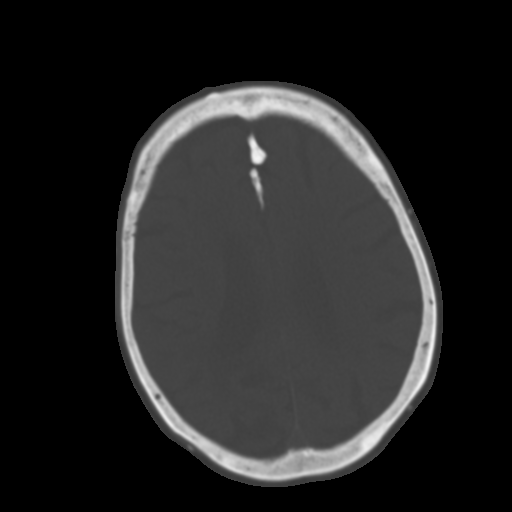
[im 19/27  brain]
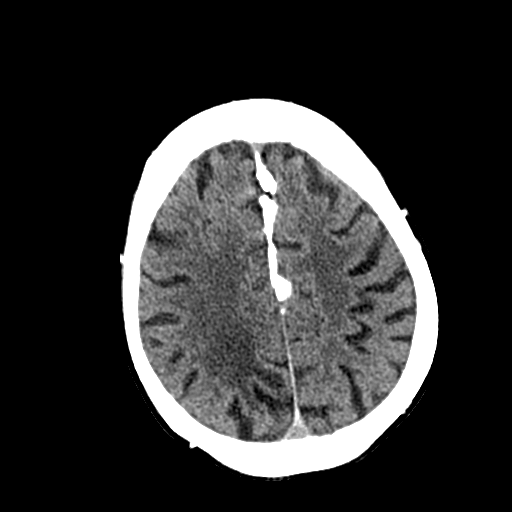
[im 21/27  brain]
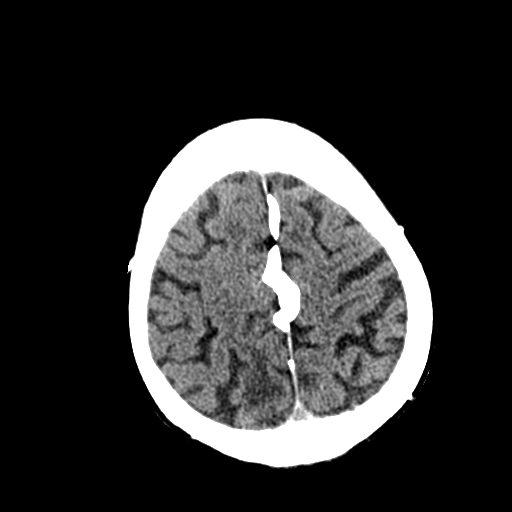
[im 23/27  brain]
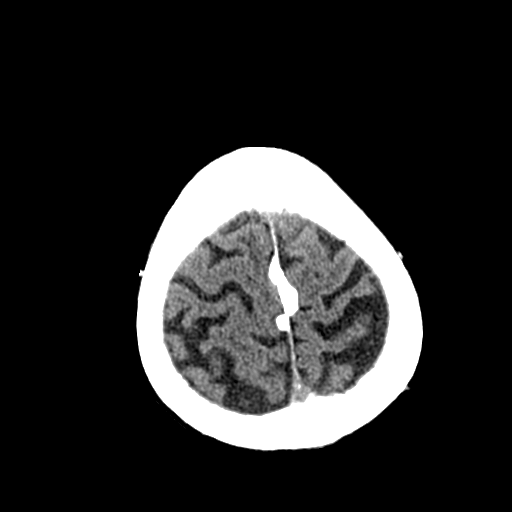
[im 25/27  brain]
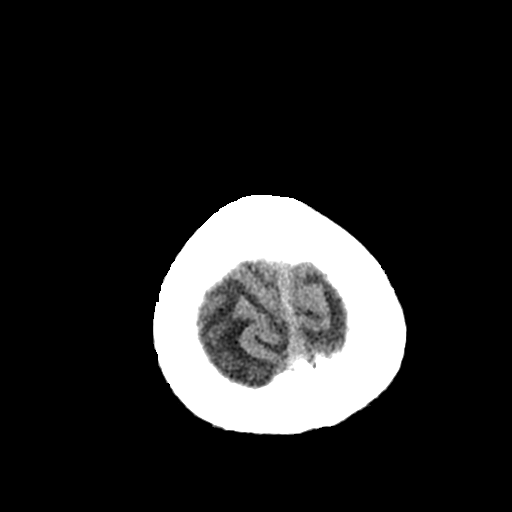
[im 25/27  bone]
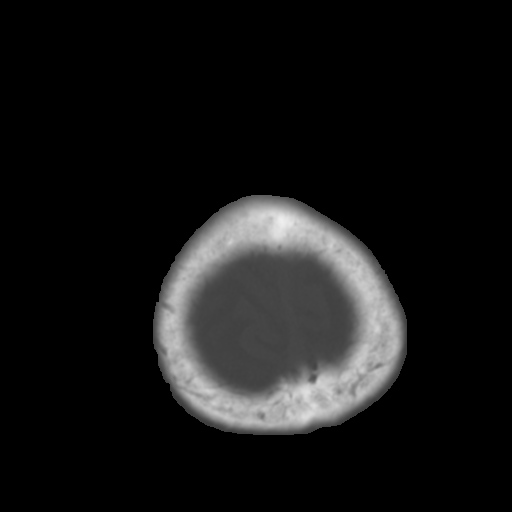

[Series 3: bone windows · axial · 0.43mm/px · z∈[+1058,+1078]mm · 2 of 27 slices shown]
[im 2/27  bone]
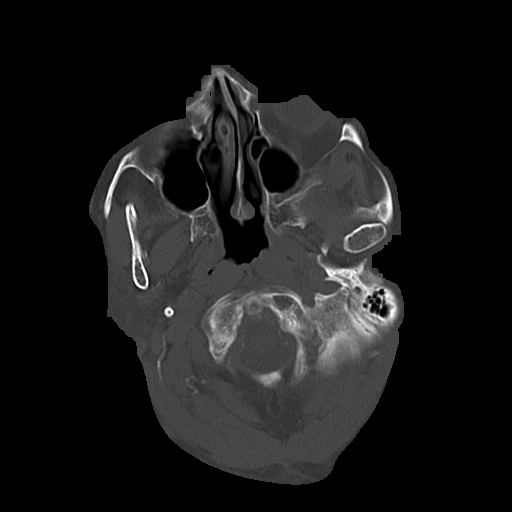
[im 6/27  bone]
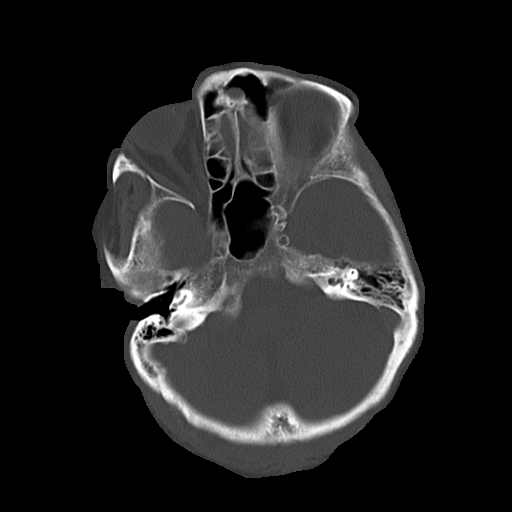

[15 of 30 positions shown; findings below may reference images not displayed]

FINDINGS: Diffuse cerebral atrophy.  Mild ventricular dilatation
consistent with central atrophy.  Cavum septum vergae.  Low
attenuation change in the deep white matter consistent with small
vessel ischemia.  Encephalomalacia consistent with old infarct in
the right posterior parietal region.  Calcification of the falx.
No mass effect or midline shift.  No abnormal extra-axial fluid
collections.  Gray-white matter junctions are distinct.  Basal
cisterns are not effaced.  No evidence of acute intracranial
hemorrhage.  Vascular calcifications.  Visualized paranasal sinuses
are not opacified.  No depressed skull fractures.  Stable
appearance since previous study.
IMPRESSION: Chronic atrophy and small vessel ischemic changes.  No evidence of
acute intracranial hemorrhage, mass lesion, or acute infarct.

## 2012-10-16 DEATH — deceased

## 2012-11-01 IMAGING — CR DG CHEST 1V PORT
1 series · 1 of 1 positions shown · non-contrast
Comparison: 08/15/2011.

CLINICAL DATA: Altered mental status.

PORTABLE CHEST - 1 VIEW

[view not recorded]
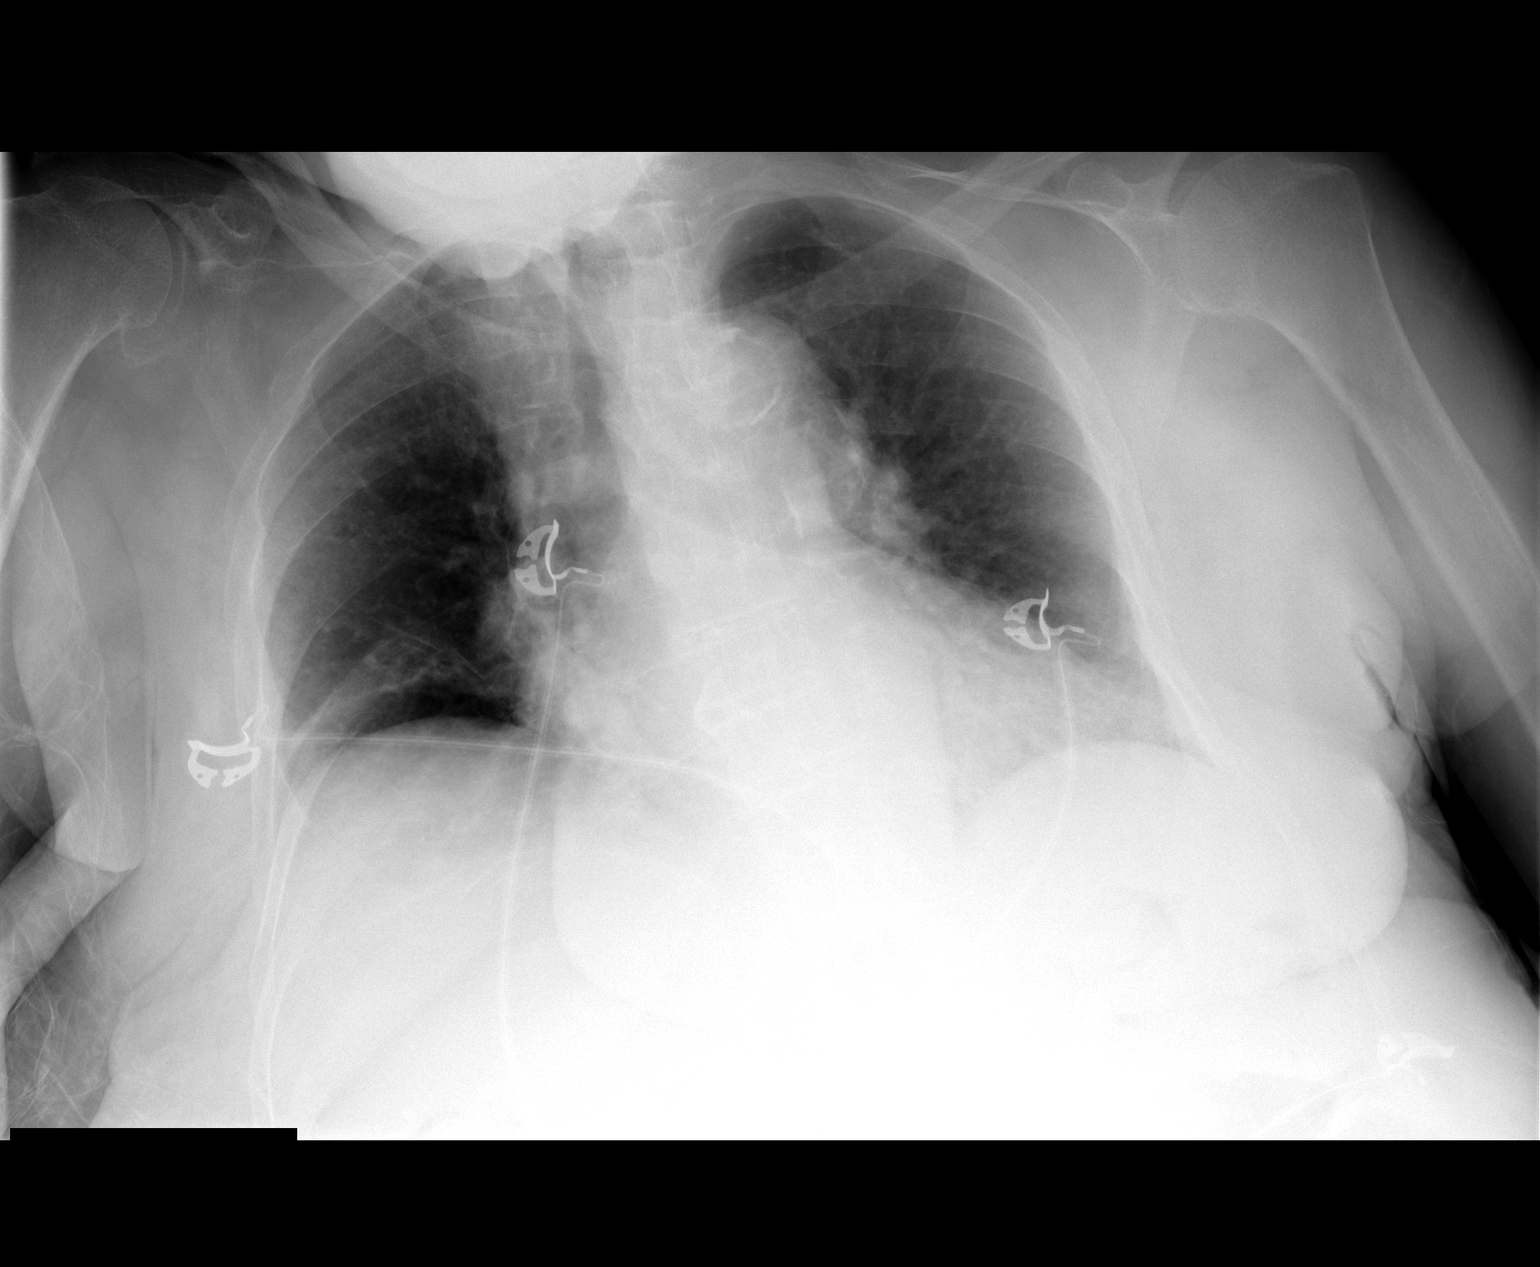

[1 of 1 positions shown; findings below may reference images not displayed]

FINDINGS: Trachea is midline.  Heart is enlarged, stable.  Lungs
are low in volume with right basilar scar and/or atelectasis.
Difficult to exclude small right pleural effusion.
IMPRESSION: Low lung volumes with right basilar subsegmental atelectasis or
scarring.  Difficult to exclude tiny right pleural effusion.

## 2014-05-19 ENCOUNTER — Encounter (HOSPITAL_COMMUNITY): Payer: Self-pay | Admitting: Radiology

## 2014-11-04 NOTE — Op Note (Signed)
PATIENT NAME:  Linda [name], Linda [name] MR#:  [id number] DATE OF BIRTH:  07-04-1931  DATE OF PROCEDURE:  03/21/2012  PREOPERATIVE DIAGNOSES:  1. End-stage renal disease.  2. Poorly functioning right arm AV graft.  3. AV graft pseudoaneurysm.  4. Stroke. 5. Hypertension.  POSTOPERATIVE DIAGNOSES: 1. End-stage renal disease.  2. Poorly functioning right arm AV graft.  3. AV graft pseudoaneurysm.  4. Stroke.  5. Hypertension.   PROCEDURES:  1. Ultrasound guidance for vascular access to right arm AV graft.  2. Right upper extremity shuntogram.  3. Percutaneous transluminal angioplasty of mid graft stenosis and venous anastomotic stenosis with 7 mm diameter angioplasty balloon.  4. Covered stent placement for mid graft stenosis and pseudoaneurysm with an 8 mm diameter Viabahn stent.  SURGEON: Annice NeedyJason S. Dew, MD   ANESTHESIA: Local with moderate conscious sedation.   ESTIMATED BLOOD LOSS: Minimal.   FLUOROSCOPY TIME: Two minutes.   CONTRAST USED: 27 mL.   INDICATION FOR PROCEDURE: This is a female with end-stage renal disease and a poorly functioning graft. We are asked to evaluate this. Risks and benefits were discussed. Informed consent was obtained.   DESCRIPTION OF PROCEDURE: The patient is brought to the Vascular Interventional Radiology Suite. Right upper extremity was sterilely prepped and draped and a sterile surgical field was created. Graft was accessed without difficulty under direct ultrasound guidance with a micropuncture needle and micropuncture wire and sheath and permanent image was recorded. Imaging through this micropuncture sheath showed the graft to have diffuse areas of narrowing and there being a moderate narrowing at the venous anastomosis. These areas were treated with a 7 mm diameter angioplasty balloon. There was also a moderate size pseudoaneurysm at the venous access site in the area of the most significant stenosis within the graft. Waists were taken with the  angioplasty balloon which improved the stenosis, the pseudoaneurysm persisted and this was treated with an 8 mm diameter x 5 cm length Viabahn stent ironed out with a 7 mm balloon with an excellent angiographic completion result. The central venous circulation was patent. With the balloon inflated the arterial anastomosis appeared patent. Once we had covered the pseudoaneurysm successfully and treated the stenosis, I elected to terminate the procedure. The sheath was removed around a 4-0 Monocryl purse-string suture. Pressure was held. Sterile dressing was placed. The patient tolerated the procedure well and was taken to the recovery room in stable condition.    ____________________________ Annice NeedyJason S. Dew, MD [id number] D: 03/21/2012 11:47:23 ET T: 03/21/2012 12:36:24 ET JOB#: [id number]  cc: Annice NeedyJason S. Dew, MD, <Dictator> Annice NeedyJASON S DEW MD ELECTRONICALLY SIGNED 03/25/2012 14:41

## 2014-11-04 NOTE — Op Note (Signed)
PATIENT NAME:  Nelwyn SalisburySWINTON, Linda MR#:  [id number] DATE OF BIRTH:  27-Dec-1930  DATE OF PROCEDURE:  02/05/2012  PREOPERATIVE DIAGNOSES:  1. End stage renal disease. 2. Clotted right arm AV graft.  3. Hypertension.  4. Dementia.   POSTOPERATIVE DIAGNOSES:  1. End stage renal disease. 2. Clotted right arm AV graft.  3. Hypertension.  4. Dementia.   PROCEDURES:  1. Ultrasound guidance for vascular access to graft in both antegrade and retrograde fashion, crossing.  2. Right upper extremity shuntogram.  3. Catheter directed thrombolysis with 4 mg of TPA with the AngioJet AVX catheter.  4. Mechanical rheolytic thrombectomy of the graft.  5. Fogarty embolectomy for arterial plug.  6. Percutaneous transluminal angioplasty of the graft in the venous anastomosis with 7 mm diameter angioplasty balloon.   SURGEON: Annice NeedyJason S. Dew, M.D.   ANESTHESIA: Local with moderate conscious sedation.   ESTIMATED BLOOD LOSS: Approximately 25 mL.   INDICATION FOR PROCEDURE: The patient is a 81 year old African American female with a clotted AV graft. She is a dialysis patient. We are attempting to open the graft for salvage.   DESCRIPTION OF PROCEDURE: The patient was brought to the vascular interventional radiology suite. The right upper extremity was sterilely prepped and draped and a sterile surgical field was created. The graft was accessed in both an antegrade and retrograde fashion crossing with ultrasound guidance due to the pulseless nature of the graft. With a micropuncture sheath we then up-sized to 6 French sheaths and imaging showed a clotted graft. I then placed Magic torque wires across both the arterial and venous anastomosis and then laced the graft with 4 mg of TPA delivered in a power pulse spray fashion. This was allowed to dwell for approximately 15 minutes then mechanical rheolytic thrombectomy was performed of the brachial artery to the axillary vein. Following this there was still residual  arterial plug that was removed with a 5 Fogarty embolectomy catheter clearing the arterial limb. The venous limb still had areas of thrombosis as well as a stenosis at the venous anastomosis. This was treated with a 7 mm diameter angioplasty balloon with good angiographic completion result. At this point, I elected to terminate the procedure. Both sheaths were removed around a 4-0 Monocryl pursestring suture, pressure was held, and a sterile dressing placed. The patient tolerated the procedure well and was taken to the recovery room in stable condition. ____________________________ Annice NeedyJason S. Dew, MD jsd:slb D: 02/05/2012 10:14:25 ET T: 02/05/2012 15:03:00 ET JOB#: [id number]  cc: Annice NeedyJason S. Dew, MD, <Dictator> Salomon MastBelayenh Befekadu, MD Annice NeedyJASON S DEW MD ELECTRONICALLY SIGNED 02/06/2012 16:20

## 2014-11-07 NOTE — Op Note (Signed)
PATIENT NAME:  Linda SalisburySWINTON, Breunna MR#:  [id number] DATE OF BIRTH:  08-15-1930  DATE OF PROCEDURE:  09/27/2012  PREOPERATIVE DIAGNOSES: 1.  Endstage renal disease.  2.  Poorly functioning right arm arteriovenous graft.  3.  Hypertension.  4.  Bilateral below-knee amputations.    POSTOPERATIVE DIAGNOSES:  1.  Endstage renal disease.  2.  Poorly functioning right arm arteriovenous graft.  3.  Hypertension.  4.  Bilateral below-knee amputations.    PROCEDURES:  1.  Ultrasound guidance for vascular access, right arm arteriovenous graft.  2.  Right upper extremity shuntogram and central venogram.   SURGEON: Annice NeedyJason S. Curtina Grills, M.D.   ANESTHESIA: Local with moderate conscious sedation.   ESTIMATED BLOOD LOSS: Minimal.   FLUOROSCOPY TIME: Approximately 1 minute.   CONTRAST USED:  15 mL.  INDICATION FOR PROCEDURE: This is a 81 year old African American female with end-stage renal disease. She is sent over from her dialysis center for poorly functioning right arm AV graft and shuntogram was requested from the center. Risks and benefits were discussed. Informed consent is obtained.   DESCRIPTION OF PROCEDURE: The patient is brought to the vascular and interventional radiology suite. The right upper extremity was sterilely prepped and draped and a sterile surgical field was created. The right upper arm AV graft was accessed under direct ultrasound guidance several centimeters beyond the arterial anastomosis, with a micropuncture needle, and a permanent image was recorded. A micropuncture wire and sheath were then placed. Imaging was performed through the micropuncture sheath. This demonstrated a patent AV graft. The previously placed stents and the venous anastomosis were widely patent. The central venous circulation was patent without flow limiting stenosis and there was good flow into the right atrium. With compression of the graft, the arterial portion and brachial artery were identified. These were  also patent without any significant areas of narrowing or stenosis present. At this point, I elected to terminate the procedure. The sheath was removed around a 4-0 Monocryl pursestring suture, pressure was held and sterile dressing was placed. The patient tolerated the procedure well and was taken to the recovery room in stable condition.  ____________________________ Annice NeedyJason S. Julieana Eshleman, MD jsd:sb D: 09/27/2012 10:26:24 ET T: 09/27/2012 10:34:37 ET JOB#: [id number]  cc: Annice NeedyJason S. Lexis Potenza, MD, <Dictator> Annice NeedyJASON S Ermal Brzozowski MD ELECTRONICALLY SIGNED 10/01/2012 11:51
